# Patient Record
Sex: Male | Born: 1950 | Race: White | Marital: Married | Smoking: Never smoker
Health system: Southern US, Community
[De-identification: ages and names within clinical notes are randomized; demographics above are authoritative.]

## PROBLEM LIST (undated history)

## (undated) DIAGNOSIS — N189 Chronic kidney disease, unspecified: Secondary | ICD-10-CM

## (undated) DIAGNOSIS — I639 Cerebral infarction, unspecified: Secondary | ICD-10-CM

## (undated) DIAGNOSIS — I6529 Occlusion and stenosis of unspecified carotid artery: Secondary | ICD-10-CM

## (undated) DIAGNOSIS — I10 Essential (primary) hypertension: Secondary | ICD-10-CM

## (undated) DIAGNOSIS — N401 Enlarged prostate with lower urinary tract symptoms: Secondary | ICD-10-CM

## (undated) DIAGNOSIS — N138 Other obstructive and reflux uropathy: Secondary | ICD-10-CM

## (undated) DIAGNOSIS — T8859XA Other complications of anesthesia, initial encounter: Secondary | ICD-10-CM

## (undated) DIAGNOSIS — M109 Gout, unspecified: Secondary | ICD-10-CM

## (undated) DIAGNOSIS — K219 Gastro-esophageal reflux disease without esophagitis: Secondary | ICD-10-CM

## (undated) DIAGNOSIS — T4145XA Adverse effect of unspecified anesthetic, initial encounter: Secondary | ICD-10-CM

## (undated) DIAGNOSIS — Z87442 Personal history of urinary calculi: Secondary | ICD-10-CM

## (undated) HISTORY — PX: KIDNEY STONE SURGERY: SHX686

## (undated) HISTORY — DX: Gout, unspecified: M10.9

## (undated) HISTORY — DX: Essential (primary) hypertension: I10

## (undated) HISTORY — PX: PROSTATECTOMY: SHX69

## (undated) HISTORY — PX: HERNIA REPAIR: SHX51

## (undated) HISTORY — DX: Occlusion and stenosis of unspecified carotid artery: I65.29

## (undated) HISTORY — DX: Cerebral infarction, unspecified: I63.9

## (undated) HISTORY — PX: APPENDECTOMY: SHX54

## (undated) HISTORY — DX: Gastro-esophageal reflux disease without esophagitis: K21.9

---

## 2001-12-27 DIAGNOSIS — K219 Gastro-esophageal reflux disease without esophagitis: Secondary | ICD-10-CM | POA: Insufficient documentation

## 2001-12-27 DIAGNOSIS — I10 Essential (primary) hypertension: Secondary | ICD-10-CM | POA: Insufficient documentation

## 2010-05-25 DIAGNOSIS — M109 Gout, unspecified: Secondary | ICD-10-CM | POA: Insufficient documentation

## 2010-06-20 DIAGNOSIS — H9319 Tinnitus, unspecified ear: Secondary | ICD-10-CM | POA: Insufficient documentation

## 2010-06-29 DIAGNOSIS — H903 Sensorineural hearing loss, bilateral: Secondary | ICD-10-CM | POA: Insufficient documentation

## 2013-07-06 DIAGNOSIS — N400 Enlarged prostate without lower urinary tract symptoms: Secondary | ICD-10-CM | POA: Insufficient documentation

## 2013-07-06 DIAGNOSIS — D509 Iron deficiency anemia, unspecified: Secondary | ICD-10-CM | POA: Insufficient documentation

## 2014-12-30 DIAGNOSIS — D126 Benign neoplasm of colon, unspecified: Secondary | ICD-10-CM | POA: Insufficient documentation

## 2017-02-05 ENCOUNTER — Ambulatory Visit (INDEPENDENT_AMBULATORY_CARE_PROVIDER_SITE_OTHER): Payer: Medicare Other | Admitting: Urology

## 2017-02-05 ENCOUNTER — Encounter: Payer: Self-pay | Admitting: Urology

## 2017-02-05 VITALS — BP 109/67 | HR 91 | Ht 68.0 in | Wt 187.4 lb

## 2017-02-05 DIAGNOSIS — R3912 Poor urinary stream: Secondary | ICD-10-CM

## 2017-02-05 DIAGNOSIS — N400 Enlarged prostate without lower urinary tract symptoms: Secondary | ICD-10-CM

## 2017-02-05 DIAGNOSIS — R972 Elevated prostate specific antigen [PSA]: Secondary | ICD-10-CM

## 2017-02-05 NOTE — Progress Notes (Signed)
02/05/2017 1:55 PM   Scott Spence [birth year] [id number]  Referring provider: Maryland Pink, MD [address] [facility] [place] [facility] [place], [place] [address]  Chief Complaint  Patient presents with  . Elevated PSA    HPI: Consultation for elevated PSA.  1) PSA elevation-patient with a PSA of 6.25 September 2016. PSA was rechecked June 2018 and noted to be 6.16.  No known FH of PCa, but doesn't know his dad's side.   No blood thinners or CV disease.   2) BPH-patient with a history of BPH and takes tamsulosin. His AUA symptom score is 15, mostly satisfied. Predominant symptoms include weak stream, straining and nocturia. He does snore. If he misses a dose of tamsulosin he did Foley notices a weaker stream.  Today, pt is seen for the above. He has been well. He denies dysuria. UA clear.    PMH: Past Medical History:  Diagnosis Date  . GERD (gastroesophageal reflux disease)   . Gout   . Hypertension     Surgical History: No past surgical history on file.  Home Medications:  Allergies as of 02/05/2017      Reactions   Meperidine    PN: GI Upset   Sulfa Antibiotics Hives   Gabapentin    Other reaction(s): Other, see comments PN: Migraine HA   Lisinopril Cough      Medication List       Accurate as of 02/05/17  1:55 PM. Always use your most recent med list.          allopurinol 300 MG tablet Commonly known as:  ZYLOPRIM Take by mouth.   FISH OIL PO Take by mouth.   loratadine 10 MG tablet Commonly known as:  CLARITIN Take by mouth.   losartan 100 MG tablet Commonly known as:  COZAAR Take by mouth.   mometasone 50 MCG/ACT nasal spray Commonly known as:  NASONEX Place into the nose.   multivitamin capsule Take by mouth.   omeprazole 20 MG capsule Commonly known as:  PRILOSEC Take by mouth.   tamsulosin 0.4 MG Caps capsule Commonly known as:  FLOMAX Take by mouth.       Allergies:  Allergies  Allergen Reactions  . Meperidine     PN: GI  Upset  . Sulfa Antibiotics Hives  . Gabapentin     Other reaction(s): Other, see comments PN: Migraine HA  . Lisinopril Cough    Family History: Family History  Problem Relation Age of Onset  . Prostate cancer Neg Hx   . Bladder Cancer Neg Hx   . Kidney cancer Neg Hx     Social History:  reports that he has never smoked. He has never used smokeless tobacco. He reports that he drinks alcohol. He reports that he does not use drugs.  ROS: UROLOGY Frequent Urination?: No Hard to postpone urination?: Yes Burning/pain with urination?: No Get up at night to urinate?: Yes Leakage of urine?: Yes Urine stream starts and stops?: No Trouble starting stream?: No Do you have to strain to urinate?: No Blood in urine?: No Urinary tract infection?: No Sexually transmitted disease?: No Injury to kidneys or bladder?: No Painful intercourse?: No Weak stream?: Yes Erection problems?: No Penile pain?: No  Gastrointestinal Nausea?: No Vomiting?: No Indigestion/heartburn?: No Diarrhea?: No Constipation?: No  Constitutional Fever: No Night sweats?: No Weight loss?: No Fatigue?: No  Skin Skin rash/lesions?: No Itching?: No  Eyes Blurred vision?: No Double vision?: No  Ears/Nose/Throat Sore throat?: No Sinus problems?: No  Hematologic/Lymphatic Swollen glands?: No Easy bruising?: No  Cardiovascular Leg swelling?: No Chest pain?: No  Respiratory Cough?: No Shortness of breath?: No  Endocrine Excessive thirst?: No  Musculoskeletal Back pain?: Yes Joint pain?: No  Neurological Headaches?: No Dizziness?: No  Psychologic Depression?: No Anxiety?: No  Physical Exam: BP 109/67 (BP Location: Left Arm, Patient Position: Sitting, Cuff Size: Normal)   Pulse 91   Ht 5\' 8"  (1.727 m)   Wt 85 kg (187 lb 6.4 oz)   BMI 28.49 kg/m   Constitutional:  Alert and oriented, No acute distress. HEENT: [place] AT, moist mucus membranes.  Trachea midline, no  masses. Cardiovascular: No clubbing, cyanosis, or edema. Respiratory: Normal respiratory effort, no increased work of breathing. GI: Abdomen is soft, nontender, nondistended, no abdominal masses GU: No CVA tenderness.  DRE: prostate about 70 grams, smooth, no hard areas or nodules  Skin: No rashes, bruises or suspicious lesions. Lymph: No cervical or inguinal adenopathy. Neurologic: Grossly intact, no focal deficits, moving all 4 extremities. Psychiatric: Normal mood and affect.  Laboratory Data: No results found for: WBC, HGB, HCT, MCV, PLT  No results found for: CREATININE  No results found for: PSA  No results found for: TESTOSTERONE  No results found for: HGBA1C  Urinalysis No results found for: COLORURINE, APPEARANCEUR, LABSPEC, PHURINE, GLUCOSEU, HGBUR, BILIRUBINUR, KETONESUR, PROTEINUR, UROBILINOGEN, NITRITE, LEUKOCYTESUR   Assessment & Plan:   1) PSA elevation - I had a long discussion with the patient on the nature of elevated PSA - benign vs malignant causes. We discussed age specific levels and that PCa can be seen on a biopsy with very low PSA levels (<=2.5). We discussed the nature risks and benefits of continued surveillance, other lab tests, imaging as well as prostate biopsy. We discussed the management of prostate cancer might include active surveillance or treatment depending on biopsy findings. All questions answered. He has done some reading and ask about a false negative biopsy. We discussed indeed a biopsy could be falsely negative which is one of the risk but if the patient had a negative biopsy in the PSA continued to rise further DRE changed we would set them up for an MRI scan. We discussed an MRI scan up from hasn't currently replaced a biopsy because the negative MRI doesn't completely rule out prostate cancer either and if the MRI is positive he would need a biopsy anyway.  2) BPH-he'll continue tamsulosin. He might benefit from a 5 alpha reductase inhibitor  in the future.    There are no diagnoses linked to this encounter.  No Follow-up on file.  Festus Aloe, [facility] [address], [place], [place] [address] [phone]

## 2017-02-06 LAB — URINALYSIS, COMPLETE
Bilirubin, UA: NEGATIVE
GLUCOSE, UA: NEGATIVE
Leukocytes, UA: NEGATIVE
Nitrite, UA: NEGATIVE
PROTEIN UA: NEGATIVE
RBC, UA: NEGATIVE
Specific Gravity, UA: 1.025 (ref 1.005–1.030)
Urobilinogen, Ur: 1 mg/dL (ref 0.2–1.0)
pH, UA: 6 (ref 5.0–7.5)

## 2017-02-26 ENCOUNTER — Encounter: Payer: Self-pay | Admitting: Urology

## 2017-02-26 ENCOUNTER — Other Ambulatory Visit: Payer: Self-pay | Admitting: Urology

## 2017-02-26 ENCOUNTER — Ambulatory Visit (INDEPENDENT_AMBULATORY_CARE_PROVIDER_SITE_OTHER): Payer: Medicare Other | Admitting: Urology

## 2017-02-26 VITALS — BP 136/81 | HR 70 | Ht 68.0 in | Wt 182.0 lb

## 2017-02-26 DIAGNOSIS — R972 Elevated prostate specific antigen [PSA]: Secondary | ICD-10-CM | POA: Diagnosis not present

## 2017-02-26 MED ORDER — GENTAMICIN SULFATE 40 MG/ML IJ SOLN
80.0000 mg | Freq: Once | INTRAMUSCULAR | Status: AC
Start: 2017-02-26 — End: 2017-02-26
  Administered 2017-02-26: 80 mg via INTRAMUSCULAR

## 2017-02-26 MED ORDER — LEVOFLOXACIN 500 MG PO TABS
500.0000 mg | ORAL_TABLET | Freq: Once | ORAL | Status: AC
  Administered 2017-02-26: 500 mg via ORAL

## 2017-02-26 NOTE — Progress Notes (Signed)
Prostate Biopsy Procedure   Informed consent was obtained after discussing risks/benefits of the procedure.  A time out was performed to ensure correct patient identity.  Pre-Procedure: - Last PSA Level: 6.16 - Gentamicin given prophylactically - Levaquin 500 mg administered PO -Transrectal Ultrasound performed revealing a 50 gm prostate -No significant hypoechoic or median lobe noted  Procedure: - Prostate block performed using 10 cc 1% lidocaine and biopsies taken from sextant areas, a total of 12 under ultrasound guidance.  Post-Procedure: - Patient tolerated the procedure well - He was counseled to seek immediate medical attention if experiences any severe pain, significant bleeding, or fevers - Return in one week to discuss biopsy results

## 2017-03-01 LAB — PATHOLOGY REPORT

## 2017-03-04 ENCOUNTER — Other Ambulatory Visit: Payer: Self-pay | Admitting: Urology

## 2017-03-14 ENCOUNTER — Ambulatory Visit (INDEPENDENT_AMBULATORY_CARE_PROVIDER_SITE_OTHER): Payer: Medicare Other | Admitting: Urology

## 2017-03-14 ENCOUNTER — Encounter: Payer: Self-pay | Admitting: Urology

## 2017-03-14 VITALS — BP 124/68 | HR 76 | Ht 68.0 in | Wt 191.1 lb

## 2017-03-14 DIAGNOSIS — R972 Elevated prostate specific antigen [PSA]: Secondary | ICD-10-CM

## 2017-03-14 DIAGNOSIS — N400 Enlarged prostate without lower urinary tract symptoms: Secondary | ICD-10-CM

## 2017-03-14 NOTE — Progress Notes (Signed)
03/14/2017 3:12 PM   Scott Spence 1951-03-04 [id number]  Referring provider: Maryland Pink, MD [address] [facility] [place], [place] [address]  Chief Complaint  Patient presents with  . Follow-up    Biopsy results    HPI: The patient is a 66 year old gentleman presents to discuss his prostate biopsy results.  1) PSA elevation-patient with a PSA of 6.25 September 2016. PSA was rechecked June 2018 and noted to be 6.16.  No known FH of PCa, but doesn't know his dad's side.   Patient on a prostate biopsy which was negative for malignancy he did have some focal inflammation.  2) BPH-patient with a history of BPH and takes tamsulosin 0.8 mg. His AUA symptom score is 15, mostly satisfied. Predominant symptoms include weak stream, straining and nocturia. He does snore. If he misses a dose of tamsulosin he does notice a weaker stream.     PMH: Past Medical History:  Diagnosis Date  . GERD (gastroesophageal reflux disease)   . Gout   . Hypertension     Surgical History: History reviewed. No pertinent surgical history.  Home Medications:  Allergies as of 03/14/2017      Reactions   Meperidine    PN: GI Upset   Sulfa Antibiotics Hives   Gabapentin    Other reaction(s): Other, see comments PN: Migraine HA   Lisinopril Cough      Medication List       Accurate as of 03/14/17  3:12 PM. Always use your most recent med list.          allopurinol 300 MG tablet Commonly known as:  ZYLOPRIM Take by mouth.   FISH OIL PO Take by mouth.   loratadine 10 MG tablet Commonly known as:  CLARITIN Take by mouth.   losartan 100 MG tablet Commonly known as:  COZAAR Take by mouth.   mometasone 50 MCG/ACT nasal spray Commonly known as:  NASONEX Place into the nose.   multivitamin capsule Take by mouth.   omeprazole 20 MG capsule Commonly known as:  PRILOSEC Take by mouth.   tamsulosin 0.4 MG Caps capsule Commonly known as:  FLOMAX Take by mouth.            Discharge Care Instructions        Start     Ordered   03/14/17 0000  PSA     03/14/17 1512      Allergies:  Allergies  Allergen Reactions  . Meperidine     PN: GI Upset  . Sulfa Antibiotics Hives  . Gabapentin     Other reaction(s): Other, see comments PN: Migraine HA  . Lisinopril Cough    Family History: Family History  Problem Relation Age of Onset  . Prostate cancer Neg Hx   . Bladder Cancer Neg Hx   . Kidney cancer Neg Hx     Social History:  reports that he has never smoked. He has never used smokeless tobacco. He reports that he drinks alcohol. He reports that he does not use drugs.  ROS: UROLOGY Frequent Urination?: Yes Hard to postpone urination?: No Burning/pain with urination?: No Get up at night to urinate?: Yes Leakage of urine?: No Urine stream starts and stops?: No Trouble starting stream?: No Do you have to strain to urinate?: No Blood in urine?: No Urinary tract infection?: No Sexually transmitted disease?: No Injury to kidneys or bladder?: No Painful intercourse?: No Weak stream?: No Erection problems?: No Penile pain?: No  Gastrointestinal Nausea?:  No Vomiting?: No Indigestion/heartburn?: No Diarrhea?: No Constipation?: No  Constitutional Fever: No Night sweats?: No Weight loss?: No Fatigue?: No  Skin Skin rash/lesions?: No Itching?: No  Eyes Blurred vision?: No Double vision?: No  Ears/Nose/Throat Sore throat?: No Sinus problems?: No  Hematologic/Lymphatic Swollen glands?: No Easy bruising?: No  Cardiovascular Leg swelling?: No Chest pain?: No  Respiratory Cough?: No Shortness of breath?: No  Endocrine Excessive thirst?: No  Musculoskeletal Back pain?: Yes Joint pain?: Yes  Neurological Headaches?: No Dizziness?: No  Psychologic Depression?: No Anxiety?: No  Physical Exam: BP 124/68 (BP Location: Left Arm, Patient Position: Sitting, Cuff Size: Normal)   Pulse 76   Ht 5\' 8"  (1.727 m)    Wt 191 lb 1.6 oz (86.7 kg)   BMI 29.06 kg/m   Constitutional:  Alert and oriented, No acute distress. HEENT: [place] AT, moist mucus membranes.  Trachea midline, no masses. Cardiovascular: No clubbing, cyanosis, or edema. Respiratory: Normal respiratory effort, no increased work of breathing. GI: Abdomen is soft, nontender, nondistended, no abdominal masses GU: No CVA tenderness.  Skin: No rashes, bruises or suspicious lesions. Lymph: No cervical or inguinal adenopathy. Neurologic: Grossly intact, no focal deficits, moving all 4 extremities. Psychiatric: Normal mood and affect.  Laboratory Data: No results found for: WBC, HGB, HCT, MCV, PLT  No results found for: CREATININE  No results found for: PSA  No results found for: TESTOSTERONE  No results found for: HGBA1C  Urinalysis    Component Value Date/Time   APPEARANCEUR Clear 02/05/2017 1421   GLUCOSEU Negative 02/05/2017 1421   BILIRUBINUR Negative 02/05/2017 1421   PROTEINUR Negative 02/05/2017 1421   NITRITE Negative 02/05/2017 1421   LEUKOCYTESUR Negative 02/05/2017 1421     Assessment & Plan:    1. Elevated PSA Negative prostate biopsy. Follow up in 6 months with repeat PSA.  2. BPH Continue Flomax  Return in about 6 months (around 09/14/2017) for PSA prior.  Nickie Retort, MD  [facility] [address], [place], [place] [address] [phone]

## 2017-09-09 ENCOUNTER — Other Ambulatory Visit: Payer: Medicare Other

## 2017-09-09 DIAGNOSIS — R972 Elevated prostate specific antigen [PSA]: Secondary | ICD-10-CM

## 2017-09-10 LAB — PSA: PROSTATE SPECIFIC AG, SERUM: 6.8 ng/mL — AB (ref 0.0–4.0)

## 2017-09-12 ENCOUNTER — Ambulatory Visit (INDEPENDENT_AMBULATORY_CARE_PROVIDER_SITE_OTHER): Payer: Medicare Other | Admitting: Urology

## 2017-09-12 ENCOUNTER — Encounter: Payer: Self-pay | Admitting: Urology

## 2017-09-12 VITALS — BP 136/89 | HR 72 | Ht 68.0 in | Wt 195.3 lb

## 2017-09-12 DIAGNOSIS — N400 Enlarged prostate without lower urinary tract symptoms: Secondary | ICD-10-CM

## 2017-09-12 DIAGNOSIS — R972 Elevated prostate specific antigen [PSA]: Secondary | ICD-10-CM | POA: Diagnosis not present

## 2017-09-12 NOTE — Progress Notes (Signed)
09/12/2017 2:05 PM   Scott Spence 1951/04/16 [id number]  Referring provider: Maryland Pink, MD [address] [facility] [place], [place] [address]  Chief Complaint  Patient presents with  . Elevated PSA    HPI: The patient is a 67 year old gentleman presents today for follow-up of elevated PSA.  1) PSA elevation-patient with a PSA of 6.25 September 2016. PSA was rechecked June 2018 and noted to be 6.16. PSA in February 2019 was 6.8.   No known FH of PCa, but doesn't know his dad's side.   Patient ha a prostate biopsy which was negative for malignancy he did have some focal inflammation in August 2018.  DRE 70 gm benign in July 2018.   2) BPH-patient with a history of BPH and takes tamsulosin 0.8 mg. His AUA symptom score was 15, mostly satisfied. Predominant symptoms include weak stream, straining and nocturia. He does snore. If he misses a dose of tamsulosin he does notice a weaker stream.    Today, the patient returns to discuss his elevated PSA.  He has no new complaints at this time.  PMH: Past Medical History:  Diagnosis Date  . GERD (gastroesophageal reflux disease)   . Gout   . Hypertension     Surgical History: History reviewed. No pertinent surgical history.  Home Medications:  Allergies as of 09/12/2017      Reactions   Meperidine    PN: GI Upset   Sulfa Antibiotics Hives   Gabapentin    Other reaction(s): Other, see comments PN: Migraine HA   Lisinopril Cough      Medication List        Accurate as of 09/12/17  2:05 PM. Always use your most recent med list.          allopurinol 300 MG tablet Commonly known as:  ZYLOPRIM Take by mouth.   Coenzyme Q-10 200 MG Caps Take by mouth.   colchicine 0.6 MG tablet Take by mouth.   FISH OIL PO Take by mouth.   loratadine 10 MG tablet Commonly known as:  CLARITIN Take by mouth.   losartan 100 MG tablet Commonly known as:  COZAAR Take by mouth.   magnesium citrate Soln Take 1 Bottle  by mouth once.   mometasone 50 MCG/ACT nasal spray Commonly known as:  NASONEX Place into the nose.   multivitamin capsule Take by mouth.   omeprazole 20 MG capsule Commonly known as:  PRILOSEC Take by mouth.   RESTASIS 0.05 % ophthalmic emulsion Generic drug:  cycloSPORINE   tamsulosin 0.4 MG Caps capsule Commonly known as:  FLOMAX Take by mouth.   triamcinolone cream 0.1 % Commonly known as:  KENALOG   VITAMIN B 12 PO Take by mouth.       Allergies:  Allergies  Allergen Reactions  . Meperidine     PN: GI Upset  . Sulfa Antibiotics Hives  . Gabapentin     Other reaction(s): Other, see comments PN: Migraine HA  . Lisinopril Cough    Family History: Family History  Problem Relation Age of Onset  . Prostate cancer Neg Hx   . Bladder Cancer Neg Hx   . Kidney cancer Neg Hx     Social History:  reports that  has never smoked. he has never used smokeless tobacco. He reports that he drinks alcohol. He reports that he does not use drugs.  ROS: UROLOGY Frequent Urination?: No Hard to postpone urination?: No Burning/pain with urination?: No Get up at night  to urinate?: Yes Leakage of urine?: No Urine stream starts and stops?: No Trouble starting stream?: No Do you have to strain to urinate?: No Blood in urine?: No Urinary tract infection?: No Sexually transmitted disease?: No Injury to kidneys or bladder?: No Painful intercourse?: No Weak stream?: No Erection problems?: No Penile pain?: No  Gastrointestinal Nausea?: No Vomiting?: No Indigestion/heartburn?: No Diarrhea?: No Constipation?: No  Constitutional Fever: No Night sweats?: No Weight loss?: No Fatigue?: No  Skin Skin rash/lesions?: No Itching?: No  Eyes Blurred vision?: No Double vision?: No  Ears/Nose/Throat Sore throat?: No Sinus problems?: No  Hematologic/Lymphatic Swollen glands?: No Easy bruising?: No  Cardiovascular Leg swelling?: No Chest pain?:  No  Respiratory Cough?: No Shortness of breath?: No  Endocrine Excessive thirst?: No  Musculoskeletal Back pain?: No Joint pain?: No  Neurological Headaches?: No Dizziness?: No  Psychologic Depression?: No Anxiety?: No  Physical Exam: BP 136/89 (BP Location: Right Arm, Patient Position: Sitting, Cuff Size: Normal)   Pulse 72   Ht 5\' 8"  (1.727 m)   Wt 195 lb 4.8 oz (88.6 kg)   BMI 29.70 kg/m   Constitutional:  Alert and oriented, No acute distress. HEENT: [place] AT, moist mucus membranes.  Trachea midline, no masses. Cardiovascular: No clubbing, cyanosis, or edema. Respiratory: Normal respiratory effort, no increased work of breathing. GI: Abdomen is soft, nontender, nondistended, no abdominal masses GU: No CVA tenderness.  Skin: No rashes, bruises or suspicious lesions. Lymph: No cervical or inguinal adenopathy. Neurologic: Grossly intact, no focal deficits, moving all 4 extremities. Psychiatric: Normal mood and affect.  Laboratory Data: No results found for: WBC, HGB, HCT, MCV, PLT  No results found for: CREATININE  No results found for: PSA  No results found for: TESTOSTERONE  No results found for: HGBA1C  Urinalysis    Component Value Date/Time   APPEARANCEUR Clear 02/05/2017 1421   GLUCOSEU Negative 02/05/2017 1421   BILIRUBINUR Negative 02/05/2017 1421   PROTEINUR Negative 02/05/2017 1421   NITRITE Negative 02/05/2017 1421   LEUKOCYTESUR Negative 02/05/2017 1421    Assessment & Plan:    1. Elevated PSA PSA relatively stable. Repeat PSA/DRE in 6 months  2. BPH Continue flomax 0.8 mg  Return in about 6 months (around 03/12/2018) for PSA prior.  Nickie Retort, MD  [facility] [address], [place], [place] [address] [phone]

## 2018-01-15 DIAGNOSIS — M1611 Unilateral primary osteoarthritis, right hip: Secondary | ICD-10-CM | POA: Insufficient documentation

## 2018-03-10 ENCOUNTER — Other Ambulatory Visit: Payer: Medicare Other

## 2018-03-12 ENCOUNTER — Ambulatory Visit: Payer: Medicare Other | Admitting: Urology

## 2018-03-17 ENCOUNTER — Encounter
Admission: RE | Admit: 2018-03-17 | Discharge: 2018-03-17 | Disposition: A | Discharge status: Home / Self Care | Payer: Medicare Other | Source: Ambulatory Visit | Attending: Orthopedic Surgery | Admitting: Orthopedic Surgery

## 2018-03-17 ENCOUNTER — Other Ambulatory Visit: Payer: Self-pay

## 2018-03-17 ENCOUNTER — Other Ambulatory Visit: Payer: Medicare Other

## 2018-03-17 DIAGNOSIS — I10 Essential (primary) hypertension: Secondary | ICD-10-CM | POA: Diagnosis not present

## 2018-03-17 DIAGNOSIS — Z01812 Encounter for preprocedural laboratory examination: Secondary | ICD-10-CM | POA: Diagnosis not present

## 2018-03-17 DIAGNOSIS — Z0183 Encounter for blood typing: Secondary | ICD-10-CM | POA: Insufficient documentation

## 2018-03-17 DIAGNOSIS — Z01810 Encounter for preprocedural cardiovascular examination: Secondary | ICD-10-CM | POA: Insufficient documentation

## 2018-03-17 DIAGNOSIS — R972 Elevated prostate specific antigen [PSA]: Secondary | ICD-10-CM

## 2018-03-17 DIAGNOSIS — R9431 Abnormal electrocardiogram [ECG] [EKG]: Secondary | ICD-10-CM | POA: Diagnosis not present

## 2018-03-17 HISTORY — DX: Other complications of anesthesia, initial encounter: T88.59XA

## 2018-03-17 HISTORY — DX: Adverse effect of unspecified anesthetic, initial encounter: T41.45XA

## 2018-03-17 HISTORY — DX: Chronic kidney disease, unspecified: N18.9

## 2018-03-17 HISTORY — DX: Personal history of urinary calculi: Z87.442

## 2018-03-17 LAB — APTT: APTT: 27 s (ref 24–36)

## 2018-03-17 LAB — CBC
HEMATOCRIT: 43.2 % (ref 40.0–52.0)
Hemoglobin: 14.8 g/dL (ref 13.0–18.0)
MCH: 33.9 pg (ref 26.0–34.0)
MCHC: 34.3 g/dL (ref 32.0–36.0)
MCV: 98.8 fL (ref 80.0–100.0)
PLATELETS: 136 10*3/uL — AB (ref 150–440)
RBC: 4.38 MIL/uL — AB (ref 4.40–5.90)
RDW: 13.8 % (ref 11.5–14.5)
WBC: 6.9 10*3/uL (ref 3.8–10.6)

## 2018-03-17 LAB — URINALYSIS, ROUTINE W REFLEX MICROSCOPIC
BILIRUBIN URINE: NEGATIVE
Glucose, UA: NEGATIVE mg/dL
HGB URINE DIPSTICK: NEGATIVE
KETONES UR: NEGATIVE mg/dL
Leukocytes, UA: NEGATIVE
NITRITE: NEGATIVE
Protein, ur: NEGATIVE mg/dL
Specific Gravity, Urine: 1.018 (ref 1.005–1.030)
pH: 5 (ref 5.0–8.0)

## 2018-03-17 LAB — SURGICAL PCR SCREEN
MRSA, PCR: NEGATIVE
Staphylococcus aureus: NEGATIVE

## 2018-03-17 LAB — BASIC METABOLIC PANEL
ANION GAP: 9 (ref 5–15)
BUN: 22 mg/dL (ref 8–23)
CO2: 26 mmol/L (ref 22–32)
Calcium: 9.5 mg/dL (ref 8.9–10.3)
Chloride: 104 mmol/L (ref 98–111)
Creatinine, Ser: 1.08 mg/dL (ref 0.61–1.24)
GFR calc Af Amer: >60 mL/min (ref >60)
Glucose, Bld: 101 mg/dL — ABNORMAL HIGH (ref 70–99)
POTASSIUM: 4.3 mmol/L (ref 3.5–5.1)
Sodium: 139 mmol/L (ref 135–145)

## 2018-03-17 LAB — TYPE AND SCREEN
ABO/RH(D): A POS
ANTIBODY SCREEN: NEGATIVE

## 2018-03-17 LAB — SEDIMENTATION RATE: Sed Rate: 3 mm/hr (ref 0–20)

## 2018-03-17 LAB — PROTIME-INR
INR: 0.92
Prothrombin Time: 12.3 seconds (ref 11.4–15.2)

## 2018-03-17 LAB — PSA: PROSTATIC SPECIFIC ANTIGEN: 5.87 ng/mL — AB (ref 0.00–4.00)

## 2018-03-17 NOTE — Care Management (Signed)
EKG reviewed. No ST elevations. No T wave inversions, No LAD. No severe hypertension recorded. I don't feel we need further consultations.

## 2018-03-17 NOTE — Patient Instructions (Addendum)
  Your procedure is scheduled on: Tuesday April 01, 2018 Report to Same Day Surgery 2nd floor medical mall ([place] Entrance-take elevator on left to 2nd floor.  Check in with surgery information desk.) To find out your arrival time please call [phone] between 1PM - 3PM on Monday March 31, 2018  Remember: Instructions that are not followed completely may result in serious medical risk, up to and including death, or upon the discretion of your surgeon and anesthesiologist your surgery may need to be rescheduled.    _x___ 1. Do not eat food (including mints, candies, chewing gum) after midnight the night before your procedure. You may drink clear liquids up to 2 hours before you are scheduled to arrive at the hospital for your procedure.  Do not drink clear liquids within 2 hours of your scheduled arrival to the hospital.  Clear liquids include  --Water or Apple juice without pulp  --Clear carbohydrate beverage such as Gatorade  --Black Coffee or Clear Tea (No milk, no creamers, do not add anything to the coffee or tea)    __x__ 2. No Alcohol for 24 hours before or after surgery.   __x__ 3. No Smoking or e-cigarettes for 24 prior to surgery.  Do not use any chewable tobacco products for at least 6 hour prior to surgery   __x__ 4. Notify your doctor if there is any change in your medical condition (cold, fever, infections).   __x__ 5. On the morning of surgery brush your teeth with toothpaste and water.  You may rinse your mouth with mouth wash if you wish.  Do not swallow any toothpaste or mouthwash.  Please bring a copy of your advanced directive ([facility] [place])  Please read over the following fact sheets that you were given:   [facility] Preparing for Surgery and or MRSA Information    __x__ Use CHG Soap or sage wipes as directed on instruction sheet    Do not wear jewelry.  Do not wear lotions, powders, deodorant, or perfumes.   Do not  shave below the face/neck 48 hours prior to surgery.   Do not bring valuables to the hospital.    [facility] is not responsible for any belongings or valuables.               Contacts, dentures or bridgework may not be worn into surgery.  Leave your suitcase in the car. After surgery it may be brought to your room.  For patients admitted to the hospital, discharge time is determined by your treatment team.   _x___ Take anti-hypertensive listed below, cardiac, seizure, asthma, anti-reflux and psychiatric medicines. These include:  1. Allopurinol/Zyloprim  2. Tamsulosin/Flomax  3. Mometasone/Nasonex  4. Omeprazole/Prilosec  5. Restasis Eye Drops  Do not take your Losartan/Cozaar on the day of surgery.  _x___ Follow recommendations from Cardiologist, Pulmonologist or PCP regarding stopping Aspirin, Coumadin, Plavix ,Eliquis, Effient, or Pradaxa, and Pletal.  _x___ NOW: Stop Anti-inflammatories such as Meloxicam/Mobic, Advil, Aleve, Ibuprofen, Motrin, Naproxen, Naprosyn, Goodies powders or aspirin products. OK to take Tylenol and Celebrex.   _x___ NOW: Stop supplements (Calcium, Coenzyme Q, Glucosamine Complex, Omega-3/Fish Oil) until after surgery.  But may continue Vitamin D, Vitamin B, and multivitamin.

## 2018-03-19 ENCOUNTER — Inpatient Hospital Stay: Admission: RE | Admit: 2018-03-19 | Payer: Self-pay | Source: Ambulatory Visit

## 2018-03-19 LAB — URINE CULTURE: Culture: NO GROWTH

## 2018-03-20 ENCOUNTER — Ambulatory Visit (INDEPENDENT_AMBULATORY_CARE_PROVIDER_SITE_OTHER): Payer: Medicare Other | Admitting: Urology

## 2018-03-20 ENCOUNTER — Encounter: Payer: Self-pay | Admitting: Urology

## 2018-03-20 VITALS — BP 115/71 | HR 83 | Ht 67.0 in | Wt 189.9 lb

## 2018-03-20 DIAGNOSIS — R972 Elevated prostate specific antigen [PSA]: Secondary | ICD-10-CM

## 2018-03-20 NOTE — Patient Instructions (Signed)
Prostate-Specific Antigen Test Why am I having this test? The prostate-specific antigen (PSA) test is performed to determine how much PSA you have in your blood. PSA is a type of protein that is normally present in the prostate gland. Certain conditions can cause PSA blood levels to increase, such as:  Infection in the prostate (prostatitis).  Enlargement of the prostate (hypertrophy).  Prostate cancer.  Because PSA levels increase greatly from prostate cancer, this test can be used to confirm a diagnosis of prostate cancer. It may also be used to monitor treatment for prostate cancer and to watch for a return of prostate cancer after treatment has finished. This test has a very high false-positive rate. Therefore, routine PSA screening for all men is no longer recommended. A false-positive result is incorrect because it indicates a condition or finding is present when it is not. What kind of sample is taken? A blood sample is required for this test. It is usually collected by inserting a needle into a vein or by sticking a finger with a small needle. How do I prepare for this test? There is no preparation required for this test. However, there are factors that can affect the results of a PSA test. To get the most accurate results:  Avoid having a rectal exam within several hours before having your blood drawn for this test.  Avoid having any procedures performed on the prostate gland within 6 weeks of having this test.  Avoid ejaculating within 24 hours of having this test.  Tell your health care provider if you had a recent urinary tract infection (UTI).  Tell your health care provider if you are taking medicines to assist with hair growth, such as finasteride.  Tell your health care provider if you have been exposed to a medicine called diethylstilbestrol.  Let your health care provider know if any of these factors apply to you. You may be asked to reschedule the test. What are the  reference ranges? Reference ranges are established after testing a large group of people. Reference ranges may vary among different people, labs, and hospitals. It is your responsibility to obtain your test results. Ask the lab or department performing the test when and how you will get your results.  Low: 0-2.5 ng/mL.  Slightly to moderately elevated: 2.6-10.0 ng/mL.  Moderately elevated: 10.0-19.9 ng/mL.  Significantly elevated: 20 ng/mL or greater.  What do the results mean? PSA test results greater than 4 ng/mL are found in the majority of men with prostate cancer. If your test result is above this level, this can indicate an increased risk for prostate cancer. Increased PSA levels can also indicate other health conditions. Talk with your health care provider to discuss your results, treatment options, and if necessary, the need for more tests. Talk with your health care provider if you have any questions about your results. Talk with your health care provider to discuss your results, treatment options, and if necessary, the need for more tests. Talk with your health care provider if you have any questions about your results. This information is not intended to replace advice given to you by your health care provider. Make sure you discuss any questions you have with your health care provider. Document Released: 08/11/2004 Document Revised: 03/14/2016 Document Reviewed: 12/02/2013 Elsevier Interactive Patient Education  2018 Elsevier Inc.  

## 2018-03-20 NOTE — Progress Notes (Signed)
   03/20/2018 2:59 PM   Scott Spence 06-Jul-1951 [id number]  Reason for visit: Follow up elevated PSA  HPI: I had the pleasure of seeing Scott Spence in urology clinic today for follow-up of elevated PSA.  Briefly, he is a 67 year old male whose PSA has ranged between 6-7 over the last 18 months.  He underwent prostate biopsy 02/2017 that showed a 50g gland with focal inflammation but otherwise benign.  History of prostatitis in his 17s, though no recent infections.  He is happy with his voiding on Flomax and overall has no complaints.  PSA today is down to 5.87 from 6.8.   ROS: Please see flowsheet from today's date for complete review of systems.  Physical Exam: There were no vitals taken for this visit.   Constitutional:  Alert and oriented, No acute distress. Respiratory: Normal respiratory effort, no increased work of breathing. GI: Abdomen is soft, nontender, nondistended, no abdominal masses Skin: No rashes, bruises or suspicious lesions. Neurologic: Grossly intact, no focal deficits, moving all 4 extremities. Psychiatric: Normal mood and affect  Laboratory Data: PSA: 02/2018: 5.87 08/2017: 6.8 12/2016: 6.16 09/2016: 6.67  Assessment & Plan:   In summary, Scott Spence is a healthy 67 year old male with a 50 g prostate and history of negative prostate biopsy in August 2018 we are following for elevated PSA.  His PSA today is down to 5.87 from 6.8.  Since his PSA is downtrending, we will see him back in 1 year with a PSA with free to total ratio, and DRE.   B[place]ley Co, [facility] [address] [place], [place] [address] [phone]

## 2018-03-31 MED ORDER — TRANEXAMIC ACID 1000 MG/10ML IV SOLN
1000.0000 mg | INTRAVENOUS | Status: AC
  Administered 2018-04-01: 1000 mg via INTRAVENOUS
  Filled 2018-03-31: qty 1000

## 2018-03-31 MED ORDER — CEFAZOLIN SODIUM-DEXTROSE 2-4 GM/100ML-% IV SOLN
2.0000 g | Freq: Once | INTRAVENOUS | Status: AC
  Administered 2018-04-01: 2 g via INTRAVENOUS

## 2018-04-01 ENCOUNTER — Inpatient Hospital Stay: Payer: Medicare Other

## 2018-04-01 ENCOUNTER — Encounter
Admission: RE | Disposition: A | Discharge status: Home Health | Payer: Self-pay | Source: Ambulatory Visit | Attending: Orthopedic Surgery

## 2018-04-01 ENCOUNTER — Encounter: Payer: Self-pay | Admitting: Anesthesiology

## 2018-04-01 ENCOUNTER — Other Ambulatory Visit: Payer: Self-pay

## 2018-04-01 ENCOUNTER — Inpatient Hospital Stay: Payer: Medicare Other | Admitting: Certified Registered"

## 2018-04-01 ENCOUNTER — Inpatient Hospital Stay: Payer: Medicare Other | Admitting: Anesthesiology

## 2018-04-01 ENCOUNTER — Inpatient Hospital Stay
Admission: RE | Admit: 2018-04-01 | Discharge: 2018-04-03 | DRG: 470 | Disposition: A | Discharge status: Home Health | Payer: Medicare Other | Source: Ambulatory Visit | Attending: Orthopedic Surgery | Admitting: Orthopedic Surgery

## 2018-04-01 DIAGNOSIS — Z882 Allergy status to sulfonamides status: Secondary | ICD-10-CM | POA: Diagnosis not present

## 2018-04-01 DIAGNOSIS — Z8249 Family history of ischemic heart disease and other diseases of the circulatory system: Secondary | ICD-10-CM | POA: Diagnosis not present

## 2018-04-01 DIAGNOSIS — I129 Hypertensive chronic kidney disease with stage 1 through stage 4 chronic kidney disease, or unspecified chronic kidney disease: Secondary | ICD-10-CM | POA: Diagnosis present

## 2018-04-01 DIAGNOSIS — Z96641 Presence of right artificial hip joint: Secondary | ICD-10-CM

## 2018-04-01 DIAGNOSIS — Z79899 Other long term (current) drug therapy: Secondary | ICD-10-CM | POA: Diagnosis not present

## 2018-04-01 DIAGNOSIS — Z419 Encounter for procedure for purposes other than remedying health state, unspecified: Secondary | ICD-10-CM

## 2018-04-01 DIAGNOSIS — Z87442 Personal history of urinary calculi: Secondary | ICD-10-CM

## 2018-04-01 DIAGNOSIS — G8918 Other acute postprocedural pain: Secondary | ICD-10-CM

## 2018-04-01 DIAGNOSIS — N181 Chronic kidney disease, stage 1: Secondary | ICD-10-CM | POA: Diagnosis present

## 2018-04-01 DIAGNOSIS — M109 Gout, unspecified: Secondary | ICD-10-CM | POA: Diagnosis present

## 2018-04-01 DIAGNOSIS — Z888 Allergy status to other drugs, medicaments and biological substances status: Secondary | ICD-10-CM

## 2018-04-01 DIAGNOSIS — Z791 Long term (current) use of non-steroidal anti-inflammatories (NSAID): Secondary | ICD-10-CM | POA: Diagnosis not present

## 2018-04-01 DIAGNOSIS — R509 Fever, unspecified: Secondary | ICD-10-CM | POA: Diagnosis not present

## 2018-04-01 DIAGNOSIS — K219 Gastro-esophageal reflux disease without esophagitis: Secondary | ICD-10-CM | POA: Diagnosis present

## 2018-04-01 DIAGNOSIS — Z23 Encounter for immunization: Secondary | ICD-10-CM

## 2018-04-01 DIAGNOSIS — M1611 Unilateral primary osteoarthritis, right hip: Principal | ICD-10-CM | POA: Diagnosis present

## 2018-04-01 HISTORY — PX: TOTAL HIP ARTHROPLASTY: SHX124

## 2018-04-01 LAB — TYPE AND SCREEN
ABO/RH(D): A POS
Antibody Screen: NEGATIVE

## 2018-04-01 SURGERY — ARTHROPLASTY, HIP, TOTAL, ANTERIOR APPROACH
Anesthesia: Spinal | Site: Hip | Laterality: Right | Wound class: Clean

## 2018-04-01 MED ORDER — METHOCARBAMOL 1000 MG/10ML IJ SOLN
500.0000 mg | Freq: Four times a day (QID) | INTRAVENOUS | Status: DC | PRN
  Filled 2018-04-01: qty 5

## 2018-04-01 MED ORDER — FENTANYL CITRATE (PF) 100 MCG/2ML IJ SOLN
INTRAMUSCULAR | Status: DC | PRN
  Administered 2018-04-01 (×2): 25 ug via INTRAVENOUS
  Administered 2018-04-01: 50 ug via INTRAVENOUS

## 2018-04-01 MED ORDER — BISACODYL 5 MG PO TBEC
5.0000 mg | DELAYED_RELEASE_TABLET | Freq: Every day | ORAL | Status: DC | PRN
  Administered 2018-04-02: 5 mg via ORAL
  Filled 2018-04-01: qty 1

## 2018-04-01 MED ORDER — ADULT MULTIVITAMIN W/MINERALS CH
1.0000 | ORAL_TABLET | Freq: Every day | ORAL | Status: DC
  Administered 2018-04-02 – 2018-04-03 (×2): 1 via ORAL
  Filled 2018-04-01 (×2): qty 1

## 2018-04-01 MED ORDER — PANTOPRAZOLE SODIUM 40 MG PO TBEC
80.0000 mg | DELAYED_RELEASE_TABLET | Freq: Every day | ORAL | Status: DC
  Administered 2018-04-02 – 2018-04-03 (×3): 80 mg via ORAL
  Filled 2018-04-01 (×3): qty 2

## 2018-04-01 MED ORDER — MIDAZOLAM HCL 2 MG/2ML IJ SOLN
INTRAMUSCULAR | Status: AC
  Filled 2018-04-01: qty 2

## 2018-04-01 MED ORDER — PHENOL 1.4 % MT LIQD
1.0000 | OROMUCOSAL | Status: DC | PRN
  Filled 2018-04-01: qty 177

## 2018-04-01 MED ORDER — NEOMYCIN-POLYMYXIN B GU 40-200000 IR SOLN
Status: DC | PRN
  Administered 2018-04-01: 4 mL

## 2018-04-01 MED ORDER — PANTOPRAZOLE SODIUM 40 MG PO TBEC
80.0000 mg | DELAYED_RELEASE_TABLET | Freq: Once | ORAL | Status: AC
  Administered 2018-04-01: 80 mg via ORAL
  Filled 2018-04-01: qty 2

## 2018-04-01 MED ORDER — METHOCARBAMOL 500 MG PO TABS
500.0000 mg | ORAL_TABLET | Freq: Four times a day (QID) | ORAL | Status: DC | PRN
  Administered 2018-04-01: 500 mg via ORAL
  Filled 2018-04-01 (×2): qty 1

## 2018-04-01 MED ORDER — OMEGA-3-ACID ETHYL ESTERS 1 G PO CAPS
1000.0000 mg | ORAL_CAPSULE | Freq: Every day | ORAL | Status: DC
  Administered 2018-04-02 – 2018-04-03 (×2): 1000 mg via ORAL
  Filled 2018-04-01 (×2): qty 1

## 2018-04-01 MED ORDER — ALLOPURINOL 300 MG PO TABS
300.0000 mg | ORAL_TABLET | Freq: Every day | ORAL | Status: DC
  Administered 2018-04-02: 300 mg via ORAL
  Filled 2018-04-01 (×2): qty 1

## 2018-04-01 MED ORDER — MAGNESIUM HYDROXIDE 400 MG/5ML PO SUSP
30.0000 mL | Freq: Every day | ORAL | Status: DC | PRN
  Administered 2018-04-02 (×2): 30 mL via ORAL
  Filled 2018-04-01: qty 30

## 2018-04-01 MED ORDER — VITAMIN B-12 1000 MCG PO TABS
3000.0000 ug | ORAL_TABLET | Freq: Every day | ORAL | Status: DC
  Administered 2018-04-03: 3000 ug via ORAL
  Filled 2018-04-01: qty 30
  Filled 2018-04-01: qty 3

## 2018-04-01 MED ORDER — PANTOPRAZOLE SODIUM 40 MG PO TBEC: 80.0000 mg | DELAYED_RELEASE_TABLET | Freq: Every day | ORAL | Status: DC

## 2018-04-01 MED ORDER — GLYCOPYRROLATE 0.2 MG/ML IJ SOLN
INTRAMUSCULAR | Status: AC
  Filled 2018-04-01: qty 1

## 2018-04-01 MED ORDER — LOSARTAN POTASSIUM 50 MG PO TABS
100.0000 mg | ORAL_TABLET | Freq: Every day | ORAL | Status: DC
  Administered 2018-04-01 – 2018-04-02 (×2): 100 mg via ORAL
  Filled 2018-04-01 (×2): qty 2

## 2018-04-01 MED ORDER — PHENYLEPHRINE HCL 10 MG/ML IJ SOLN
INTRAMUSCULAR | Status: DC | PRN
  Administered 2018-04-01 (×4): 100 ug via INTRAVENOUS

## 2018-04-01 MED ORDER — LIDOCAINE HCL (PF) 2 % IJ SOLN
INTRAMUSCULAR | Status: DC | PRN
  Administered 2018-04-01: 50 mg

## 2018-04-01 MED ORDER — HYDROCODONE-ACETAMINOPHEN 5-325 MG PO TABS
ORAL_TABLET | ORAL | Status: AC
  Administered 2018-04-01: 1 via ORAL
  Filled 2018-04-01: qty 1

## 2018-04-01 MED ORDER — ONDANSETRON HCL 4 MG/2ML IJ SOLN: 4.0000 mg | Freq: Four times a day (QID) | INTRAMUSCULAR | Status: DC | PRN

## 2018-04-01 MED ORDER — TAMSULOSIN HCL 0.4 MG PO CAPS
0.8000 mg | ORAL_CAPSULE | Freq: Every day | ORAL | Status: DC
  Administered 2018-04-02 – 2018-04-03 (×2): 0.8 mg via ORAL
  Filled 2018-04-01 (×2): qty 2

## 2018-04-01 MED ORDER — DIPHENHYDRAMINE HCL 12.5 MG/5ML PO ELIX
12.5000 mg | ORAL_SOLUTION | ORAL | Status: DC | PRN
  Administered 2018-04-01 – 2018-04-02 (×2): 12.5 mg via ORAL
  Filled 2018-04-01 (×2): qty 5
  Filled 2018-04-01: qty 10

## 2018-04-01 MED ORDER — MAGNESIUM CITRATE PO SOLN
1.0000 | Freq: Once | ORAL | Status: AC | PRN
  Administered 2018-04-03: 1 via ORAL
  Filled 2018-04-01 (×2): qty 296

## 2018-04-01 MED ORDER — PROPOFOL 500 MG/50ML IV EMUL
INTRAVENOUS | Status: DC | PRN
  Administered 2018-04-01: 50 ug/kg/min via INTRAVENOUS

## 2018-04-01 MED ORDER — VITAMIN D 1000 UNITS PO TABS
2000.0000 [IU] | ORAL_TABLET | Freq: Every day | ORAL | Status: DC
  Administered 2018-04-02: 2000 [IU] via ORAL
  Filled 2018-04-01: qty 2

## 2018-04-01 MED ORDER — BUPIVACAINE HCL (PF) 0.5 % IJ SOLN
INTRAMUSCULAR | Status: AC
  Filled 2018-04-01: qty 10

## 2018-04-01 MED ORDER — ACETAMINOPHEN 325 MG PO TABS: 325.0000 mg | ORAL_TABLET | Freq: Four times a day (QID) | ORAL | Status: DC | PRN

## 2018-04-01 MED ORDER — FLUTICASONE PROPIONATE 50 MCG/ACT NA SUSP
1.0000 | Freq: Every day | NASAL | Status: DC
  Administered 2018-04-02: 1 via NASAL
  Filled 2018-04-01: qty 16

## 2018-04-01 MED ORDER — NEOMYCIN-POLYMYXIN B GU 40-200000 IR SOLN
Status: AC
  Filled 2018-04-01: qty 20

## 2018-04-01 MED ORDER — ONDANSETRON HCL 4 MG PO TABS: 4.0000 mg | ORAL_TABLET | Freq: Four times a day (QID) | ORAL | Status: DC | PRN

## 2018-04-01 MED ORDER — FENTANYL CITRATE (PF) 100 MCG/2ML IJ SOLN: 25.0000 ug | INTRAMUSCULAR | Status: DC | PRN

## 2018-04-01 MED ORDER — ZOLPIDEM TARTRATE 5 MG PO TABS
5.0000 mg | ORAL_TABLET | Freq: Every evening | ORAL | Status: DC | PRN
  Administered 2018-04-01: 5 mg via ORAL
  Filled 2018-04-01: qty 1

## 2018-04-01 MED ORDER — ONDANSETRON HCL 4 MG/2ML IJ SOLN: 4.0000 mg | Freq: Once | INTRAMUSCULAR | Status: DC | PRN

## 2018-04-01 MED ORDER — BUPIVACAINE HCL (PF) 0.5 % IJ SOLN
INTRAMUSCULAR | Status: DC | PRN
  Administered 2018-04-01: 3 mL via INTRATHECAL

## 2018-04-01 MED ORDER — DOCUSATE SODIUM 100 MG PO CAPS
100.0000 mg | ORAL_CAPSULE | Freq: Two times a day (BID) | ORAL | Status: DC
  Administered 2018-04-01 – 2018-04-03 (×4): 100 mg via ORAL
  Filled 2018-04-01 (×4): qty 1

## 2018-04-01 MED ORDER — TRAMADOL HCL 50 MG PO TABS
50.0000 mg | ORAL_TABLET | Freq: Four times a day (QID) | ORAL | Status: DC
  Administered 2018-04-01 – 2018-04-02 (×6): 50 mg via ORAL
  Filled 2018-04-01 (×8): qty 1

## 2018-04-01 MED ORDER — MAGNESIUM OXIDE 400 (241.3 MG) MG PO TABS
250.0000 mg | ORAL_TABLET | Freq: Every day | ORAL | Status: DC
  Administered 2018-04-02 – 2018-04-03 (×2): 200 mg via ORAL
  Filled 2018-04-01 (×2): qty 1

## 2018-04-01 MED ORDER — FENTANYL CITRATE (PF) 100 MCG/2ML IJ SOLN
INTRAMUSCULAR | Status: AC
  Filled 2018-04-01: qty 2

## 2018-04-01 MED ORDER — METOCLOPRAMIDE HCL 5 MG/ML IJ SOLN: 5.0000 mg | Freq: Three times a day (TID) | INTRAMUSCULAR | Status: DC | PRN

## 2018-04-01 MED ORDER — SODIUM CHLORIDE 0.9 % IV SOLN
INTRAVENOUS | Status: DC
  Administered 2018-04-01 – 2018-04-02 (×2): via INTRAVENOUS

## 2018-04-01 MED ORDER — MORPHINE SULFATE (PF) 2 MG/ML IV SOLN
0.5000 mg | INTRAVENOUS | Status: DC | PRN
  Administered 2018-04-02: 1 mg via INTRAVENOUS
  Filled 2018-04-01: qty 1

## 2018-04-01 MED ORDER — METOCLOPRAMIDE HCL 10 MG PO TABS: 5.0000 mg | ORAL_TABLET | Freq: Three times a day (TID) | ORAL | Status: DC | PRN

## 2018-04-01 MED ORDER — ALUM & MAG HYDROXIDE-SIMETH 200-200-20 MG/5ML PO SUSP: 30.0000 mL | ORAL | Status: DC | PRN

## 2018-04-01 MED ORDER — CEFAZOLIN SODIUM-DEXTROSE 2-4 GM/100ML-% IV SOLN
2.0000 g | Freq: Four times a day (QID) | INTRAVENOUS | Status: AC
  Administered 2018-04-01 – 2018-04-02 (×3): 2 g via INTRAVENOUS
  Filled 2018-04-01 (×3): qty 100

## 2018-04-01 MED ORDER — MIDAZOLAM HCL 5 MG/5ML IJ SOLN
INTRAMUSCULAR | Status: DC | PRN
  Administered 2018-04-01 (×2): 2 mg via INTRAVENOUS

## 2018-04-01 MED ORDER — GLYCOPYRROLATE 0.2 MG/ML IJ SOLN
INTRAMUSCULAR | Status: DC | PRN
  Administered 2018-04-01: 0.2 mg via INTRAVENOUS

## 2018-04-01 MED ORDER — CEFAZOLIN SODIUM-DEXTROSE 2-4 GM/100ML-% IV SOLN
INTRAVENOUS | Status: AC
  Filled 2018-04-01: qty 100

## 2018-04-01 MED ORDER — BUPIVACAINE-EPINEPHRINE 0.25% -1:200000 IJ SOLN
INTRAMUSCULAR | Status: DC | PRN
  Administered 2018-04-01: 30 mL

## 2018-04-01 MED ORDER — LACTATED RINGERS IV SOLN
INTRAVENOUS | Status: DC
  Administered 2018-04-01: 13:00:00 via INTRAVENOUS

## 2018-04-01 MED ORDER — CYCLOSPORINE 0.05 % OP EMUL
2.0000 [drp] | Freq: Two times a day (BID) | OPHTHALMIC | Status: DC
  Administered 2018-04-01 – 2018-04-02 (×3): 2 [drp] via OPHTHALMIC
  Filled 2018-04-01 (×5): qty 1

## 2018-04-01 MED ORDER — HYDROCODONE-ACETAMINOPHEN 7.5-325 MG PO TABS
1.0000 | ORAL_TABLET | ORAL | Status: DC | PRN
  Administered 2018-04-01 (×2): 1 via ORAL
  Filled 2018-04-01 (×2): qty 1

## 2018-04-01 MED ORDER — INFLUENZA VAC SPLIT HIGH-DOSE 0.5 ML IM SUSY
0.5000 mL | PREFILLED_SYRINGE | INTRAMUSCULAR | Status: AC
  Administered 2018-04-02: 0.5 mL via INTRAMUSCULAR
  Filled 2018-04-01 (×2): qty 0.5

## 2018-04-01 MED ORDER — LIDOCAINE HCL (PF) 2 % IJ SOLN
INTRAMUSCULAR | Status: AC
  Filled 2018-04-01: qty 10

## 2018-04-01 MED ORDER — ASPIRIN EC 325 MG PO TBEC
325.0000 mg | DELAYED_RELEASE_TABLET | Freq: Every day | ORAL | Status: DC
  Administered 2018-04-02 – 2018-04-03 (×2): 325 mg via ORAL
  Filled 2018-04-01 (×2): qty 1

## 2018-04-01 MED ORDER — MENTHOL 3 MG MT LOZG
1.0000 | LOZENGE | OROMUCOSAL | Status: DC | PRN
  Filled 2018-04-01: qty 9

## 2018-04-01 MED ORDER — PROPOFOL 10 MG/ML IV BOLUS
INTRAVENOUS | Status: AC
  Filled 2018-04-01: qty 20

## 2018-04-01 MED ORDER — ACETAMINOPHEN 500 MG PO TABS
500.0000 mg | ORAL_TABLET | Freq: Four times a day (QID) | ORAL | Status: AC
  Administered 2018-04-01 – 2018-04-02 (×4): 500 mg via ORAL
  Filled 2018-04-01 (×4): qty 1

## 2018-04-01 MED ORDER — HYDROCODONE-ACETAMINOPHEN 5-325 MG PO TABS
1.0000 | ORAL_TABLET | ORAL | Status: DC | PRN
  Administered 2018-04-01 – 2018-04-02 (×2): 1 via ORAL
  Administered 2018-04-02: 2 via ORAL
  Administered 2018-04-02 (×2): 1 via ORAL
  Filled 2018-04-01: qty 2
  Filled 2018-04-01 (×5): qty 1

## 2018-04-01 SURGICAL SUPPLY — 57 items
BLADE SAGITTAL AGGR TOOTH XLG (BLADE) ×2 IMPLANT
BNDG COHESIVE 6X5 TAN STRL LF (GAUZE/BANDAGES/DRESSINGS) ×6 IMPLANT
CANISTER SUCT 1200ML W/VALVE (MISCELLANEOUS) ×2 IMPLANT
CHLORAPREP W/TINT 26ML (MISCELLANEOUS) ×2 IMPLANT
DRAPE C-ARM XRAY 36X54 (DRAPES) ×2 IMPLANT
DRAPE INCISE IOBAN 66X60 STRL (DRAPES) IMPLANT
DRAPE POUCH INSTRU U-SHP 10X18 (DRAPES) ×2 IMPLANT
DRAPE SHEET LG 3/4 BI-LAMINATE (DRAPES) ×6 IMPLANT
DRAPE TABLE BACK 80X90 (DRAPES) ×2 IMPLANT
DRESSING SURGICEL FIBRLLR 1X2 (HEMOSTASIS) ×2 IMPLANT
DRSG OPSITE POSTOP 4X8 (GAUZE/BANDAGES/DRESSINGS) ×4 IMPLANT
DRSG SURGICEL FIBRILLAR 1X2 (HEMOSTASIS) ×4
ELECT BLADE 6.5 EXT (BLADE) ×2 IMPLANT
ELECT REM PT RETURN 9FT ADLT (ELECTROSURGICAL) ×2
ELECTRODE REM PT RTRN 9FT ADLT (ELECTROSURGICAL) ×1 IMPLANT
GLOVE BIOGEL PI IND STRL 9 (GLOVE) ×1 IMPLANT
GLOVE BIOGEL PI INDICATOR 9 (GLOVE) ×1
GLOVE SURG SYN 9.0  PF PI (GLOVE) ×2
GLOVE SURG SYN 9.0 PF PI (GLOVE) ×2 IMPLANT
GOWN SRG 2XL LVL 4 RGLN SLV (GOWNS) ×1 IMPLANT
GOWN STRL NON-REIN 2XL LVL4 (GOWNS) ×1
GOWN STRL REUS W/ TWL LRG LVL3 (GOWN DISPOSABLE) ×1 IMPLANT
GOWN STRL REUS W/TWL LRG LVL3 (GOWN DISPOSABLE) ×1
HEMOVAC 400CC 10FR (MISCELLANEOUS) IMPLANT
HIP DBL LINER 54X28 (Liner) ×2 IMPLANT
HIP FEM HD S 28 (Head) ×2 IMPLANT
HOLDER FOLEY CATH W/STRAP (MISCELLANEOUS) ×2 IMPLANT
HOOD PEEL AWAY FLYTE STAYCOOL (MISCELLANEOUS) ×2 IMPLANT
KIT PREVENA INCISION MGT 13 (CANNISTER) ×2 IMPLANT
MAT ABSORB  FLUID 56X50 GRAY (MISCELLANEOUS) ×1
MAT ABSORB FLUID 56X50 GRAY (MISCELLANEOUS) ×1 IMPLANT
NDL SAFETY ECLIPSE 18X1.5 (NEEDLE) ×1 IMPLANT
NEEDLE HYPO 18GX1.5 SHARP (NEEDLE) ×1
NEEDLE SPNL 18GX3.5 QUINCKE PK (NEEDLE) ×2 IMPLANT
NS IRRIG 1000ML POUR BTL (IV SOLUTION) ×2 IMPLANT
PACK HIP COMPR (MISCELLANEOUS) ×2 IMPLANT
SCALPEL PROTECTED #10 DISP (BLADE) ×4 IMPLANT
SHELL ACETABULAR SZ 54 DM (Shell) ×2 IMPLANT
SOL PREP PVP 2OZ (MISCELLANEOUS) ×2
SOLUTION PREP PVP 2OZ (MISCELLANEOUS) ×1 IMPLANT
SPONGE DRAIN TRACH 4X4 STRL 2S (GAUZE/BANDAGES/DRESSINGS) ×2 IMPLANT
STAPLER SKIN PROX 35W (STAPLE) ×2 IMPLANT
STEM FEMORAL SZ3  STD COLLARED (Stem) ×2 IMPLANT
STRAP SAFETY 5IN WIDE (MISCELLANEOUS) ×2 IMPLANT
SUT DVC 2 QUILL PDO  T11 36X36 (SUTURE) ×1
SUT DVC 2 QUILL PDO T11 36X36 (SUTURE) ×1 IMPLANT
SUT SILK 0 (SUTURE) ×1
SUT SILK 0 30XBRD TIE 6 (SUTURE) ×1 IMPLANT
SUT V-LOC 90 ABS DVC 3-0 CL (SUTURE) ×2 IMPLANT
SUT VIC AB 1 CT1 36 (SUTURE) ×2 IMPLANT
SYR 20CC LL (SYRINGE) ×2 IMPLANT
SYR 30ML LL (SYRINGE) ×2 IMPLANT
SYR BULB IRRIG 60ML STRL (SYRINGE) ×2 IMPLANT
TAPE MICROFOAM 4IN (TAPE) ×2 IMPLANT
TOWEL OR 17X26 4PK STRL BLUE (TOWEL DISPOSABLE) ×2 IMPLANT
TRAY FOLEY MTR SLVR 16FR STAT (SET/KITS/TRAYS/PACK) ×2 IMPLANT
WND VAC CANISTER 500ML (MISCELLANEOUS) ×2 IMPLANT

## 2018-04-01 NOTE — Progress Notes (Signed)
Pt admitted to room 137 from PACU via hospital bed without incident per MD order. Pt oriented to room, call bell and unit routines. Bed alarm applied. Pt alert and oriented on admission. Pt vital signs stable. Wife, Adela Lank, at bedside and supportive. Pt had a spinal in PACU. Pt has no sensation bil hips to toes. Pt is able to twitch upper inner thigh muscles on command. + capillary refill of BLE and bounding pedal pulses. Rept received from [facility]. Will continue to monitor.

## 2018-04-01 NOTE — Anesthesia Procedure Notes (Signed)
Spinal  Patient location during procedure: OR Staffing Anesthesiologist: Thomas, Mathai, MD Resident/CRNA: Sicilia Killough, CRNA Performed: resident/CRNA  Preanesthetic Checklist Completed: patient identified, site marked, surgical consent, pre-op evaluation, timeout performed, IV checked, risks and benefits discussed and monitors and equipment checked Spinal Block Patient position: sitting Prep: ChloraPrep and site prepped and draped Patient monitoring: heart rate, continuous pulse ox, blood pressure and cardiac monitor Approach: midline Location: L4-5 Injection technique: single-shot Needle Needle type: Introducer and Pencan  Needle gauge: 24 G Needle length: 9 cm Additional Notes Negative paresthesia. Negative blood return. Positive free-flowing CSF. Expiration date of kit checked and confirmed. Patient tolerated procedure well, without complications.       

## 2018-04-01 NOTE — Progress Notes (Signed)
Pt sitting semi-fowlers in bed. Pt resting comfortably with wife at bedside. Pt now has sensation to bil knees. Pt is now able to twitch upper inner thigh muscles and knees on command. Pt still denies pain. Vital signs remain stable. Will continue to monitor.

## 2018-04-01 NOTE — Progress Notes (Signed)
Pt spinal slowly receeding down pt legs. Pt now able to clench thighs. Dr. Andree Elk notified. Ok for pt to go to room. Jamelle Noy E 5:36 PM 04/01/2018

## 2018-04-01 NOTE — H&P (Signed)
Reviewed paper H+P, will be scanned into chart. No changes noted.  

## 2018-04-01 NOTE — Transfer of Care (Signed)
Immediate Anesthesia Transfer of Care Note  Patient: Scott Spence  Procedure(s) Performed: TOTAL HIP ARTHROPLASTY ANTERIOR APPROACH (Right Hip)  Patient Location: PACU  Anesthesia Type:Spinal  Level of Consciousness: awake, alert  and oriented  Airway & Oxygen Therapy: Patient Spontanous Breathing  Post-op Assessment: Report given to RN and Post -op Vital signs reviewed and stable  Post vital signs: Reviewed  Last Vitals:  Vitals Value Taken Time  BP 99/66 04/01/2018  3:22 PM  Temp    Pulse 81 04/01/2018  3:23 PM  Resp 12 04/01/2018  3:23 PM  SpO2 99 % 04/01/2018  3:23 PM  Vitals shown include unvalidated device data.  Last Pain:  Vitals:   04/01/18 1312  TempSrc: Temporal         Complications: No apparent anesthesia complications

## 2018-04-01 NOTE — Anesthesia Preprocedure Evaluation (Addendum)
Anesthesia Evaluation  Patient identified by MRN, date of birth, ID band Patient awake    Reviewed: Allergy & Precautions, NPO status , Patient's Chart, lab work & pertinent test results, reviewed documented beta blocker date and time   Airway Mallampati: III  TM Distance: >3 FB     Dental  (+) Chipped   Pulmonary           Cardiovascular hypertension, Pt. on medications      Neuro/Psych    GI/Hepatic GERD  Controlled,  Endo/Other    Renal/GU Renal disease     Musculoskeletal   Abdominal   Peds  Hematology   Anesthesia Other Findings Gout. Not allergic to fentanyl.  Reproductive/Obstetrics                            Anesthesia Physical Anesthesia Plan  ASA: III  Anesthesia Plan: Spinal   Post-op Pain Management:    Induction:   PONV Risk Score and Plan:   Airway Management Planned:   Additional Equipment:   Intra-op Plan:   Post-operative Plan:   Informed Consent: I have reviewed the patients History and Physical, chart, labs and discussed the procedure including the risks, benefits and alternatives for the proposed anesthesia with the patient or authorized representative who has indicated his/her understanding and acceptance.     Plan Discussed with: CRNA  Anesthesia Plan Comments:         Anesthesia Quick Evaluation

## 2018-04-01 NOTE — Anesthesia Post-op Follow-up Note (Signed)
Anesthesia QCDR form completed.        

## 2018-04-01 NOTE — NC FL2 (Signed)
  [place] LEVEL OF CARE SCREENING TOOL     IDENTIFICATION  Patient Name: Scott Spence Birthdate: 11/13/1950 Sex: male Admission Date (Current Location): 04/01/2018  [employer] and [place] Number:  Engineering geologist and Address:  [facility], [address] [place], [place] [address]      Provider Number: [id number]  Attending Physician Name and Address:  Hessie Knows, MD  Relative Name and Phone Number:       Current Level of Care: Hospital Recommended Level of Care: [place] Prior Approval Number:    Date Approved/Denied:   PASRR Number: [id number])  Discharge Plan: SNF    Current Diagnoses: Patient Active Problem List   Diagnosis Date Noted  . Status post total hip replacement, right 04/01/2018    Orientation RESPIRATION BLADDER Height & Weight     Self, Time, Situation, Place  Normal Continent Weight:   Height:     BEHAVIORAL SYMPTOMS/MOOD NEUROLOGICAL BOWEL NUTRITION STATUS      Continent Diet(Diet: Regular)  AMBULATORY STATUS COMMUNICATION OF NEEDS Skin   Extensive Assist Verbally Surgical wounds, Wound Vac(Incision: Right Hip. Provena Wound Vac. )                       Personal Care Assistance Level of Assistance  Bathing, Feeding, Dressing Bathing Assistance: Limited assistance Feeding assistance: Independent Dressing Assistance: Limited assistance     Functional Limitations Info  Sight, Hearing, Speech Sight Info: Adequate Hearing Info: Adequate Speech Info: Adequate    SPECIAL CARE FACTORS FREQUENCY  PT (By licensed PT), OT (By licensed OT)     PT Frequency: (5) OT Frequency: (5)            Contractures      Additional Factors Info  Code Status, Allergies Code Status Info: (not on file. ) Allergies Info: (Meperidine, Sulfa Antibiotics, Gabapentin, Lisinopril)           Current Medications (04/01/2018):  This is the current hospital active medication list Current  Facility-Administered Medications  Medication Dose Route Frequency Provider Last Rate Last Dose  . ceFAZolin (ANCEF) 2-4 GM/100ML-% IVPB           . fentaNYL (SUBLIMAZE) injection 25 mcg  25 mcg Intravenous Q5 min PRN Gunnar Bulla, MD      . HYDROcodone-acetaminophen (NORCO/VICODIN) 5-325 MG per tablet 1-2 tablet  1-2 tablet Oral Q4H PRN Hessie Knows, MD      . lactated ringers infusion   Intravenous Continuous Gunnar Bulla, MD 75 mL/hr at 04/01/18 1327    . ondansetron (ZOFRAN) injection 4 mg  4 mg Intravenous Once PRN Gunnar Bulla, MD         Discharge Medications: Please see discharge summary for a list of discharge medications.  Relevant Imaging Results:  Relevant Lab Results:   Additional Information (SSN: [id number])  Gavino Fouch, Veronia Beets, LCSW

## 2018-04-01 NOTE — Op Note (Signed)
04/01/2018  3:21 PM  PATIENT:  Scott Spence  67 y.o. male  PRE-OPERATIVE DIAGNOSIS:  PRIMARY OSTEOARTHRITIS OF RIGHT HIP  POST-OPERATIVE DIAGNOSIS:  PRIMARY OSTEOARTHRITIS OF RIGHT HIP  PROCEDURE:  Procedure(s): TOTAL HIP ARTHROPLASTY ANTERIOR APPROACH (Right)  SURGEON: Laurene Footman, MD  ASSISTANTS: none  ANESTHESIA:   spinal  EBL:  Total I/O In: -  Out: 250 [Urine:100; Blood:150]  BLOOD ADMINISTERED:none  DRAINS: none   LOCAL MEDICATIONS USED:  MARCAINE     SPECIMEN:  Source of Specimen:  Right femoral head  DISPOSITION OF SPECIMEN:  PATHOLOGY  COUNTS:  YES  TOURNIQUET:  * No tourniquets in log *  IMPLANTS: Medacta AMIS 3 standard stem with 54 mm Mpact TM cup and liner with S metal 28 mm head  DICTATION: .Dragon Dictation   The patient was brought to the operating room and after spinal anesthesia was obtained patient was placed on the operative table with the ipsilateral foot into the Medacta attachment, contralateral leg on a well-padded table. C-arm was brought in and preop template x-ray taken. After prepping and draping in usual sterile fashion appropriate patient identification and timeout procedures were completed. Anterior approach to the hip was obtained and centered over the greater trochanter and TFL muscle. The subcutaneous tissue was incised hemostasis being achieved by electrocautery. TFL fascia was incised and the muscle retracted laterally deep retractor placed. The lateral femoral circumflex vessels were identified and ligated. The anterior capsule was exposed and a capsulotomy performed. The neck was identified and a femoral neck cut carried out with a saw. The head was removed without difficulty and showed sclerotic femoral head and acetabulum. Reaming was carried out to 54 mm and a 54 mm cup trial gave appropriate tightness to the acetabular component a 54 cup was impacted into position. The leg was then externally rotated and ischiofemoral and pubofemoral  releases carried out. The femur was sequentially broached to a size 3, size 3 standard with S head trials were placed and the final components chosen. The 3 standard stem was inserted along with a metal S 28 mm head and 54 mm liner. The hip was reduced and was stable the wound was thoroughly irrigated with fibrillar placed along the posterior capsule and medial neck. The deep fascia ws closed using a heavy Quill after infiltration of 30 cc of quarter percent Sensorcaine with epinephrine.3-0 V-loc to close the skin with skin staples.  Incisional wound VAC applied  PLAN OF CARE: Admit to inpatient

## 2018-04-01 NOTE — Progress Notes (Signed)
Pt requesting dose of pantoprazole 80 mg. Dr Roland Rack notified. Received order for medication

## 2018-04-02 ENCOUNTER — Encounter: Payer: Self-pay | Admitting: Orthopedic Surgery

## 2018-04-02 NOTE — Progress Notes (Signed)
  Subjective: 1 Day Post-Op Procedure(s) (LRB): TOTAL HIP ARTHROPLASTY ANTERIOR APPROACH (Right) Patient reports pain as moderate.   Patient seen in rounds with Dr. Rudene Christians. Patient is well, and has had no acute complaints or problems Plan is to go Home after hospital stay. Negative for chest pain and shortness of breath Fever: Low-grade at 99.2 Gastrointestinal: Negative for nausea and vomiting  Objective: Vital signs in last 24 hours: Temp:  [97 F (36.1 C)-99.2 F (37.3 C)] 99.2 F (37.3 C) (09/11 0541) Pulse Rate:  [56-84] 76 (09/11 0541) Resp:  [10-23] 18 (09/11 0541) BP: (91-162)/(60-98) 143/80 (09/11 0541) SpO2:  [93 %-100 %] 96 % (09/11 0541) Weight:  [86.1 kg] 86.1 kg (09/10 1808)  Intake/Output from previous day:  Intake/Output Summary (Last 24 hours) at 04/02/2018 0638 Last data filed at 04/02/2018 0500 Gross per 24 hour  Intake 1780 ml  Output 1050 ml  Net 730 ml    Intake/Output this shift: Total I/O In: -  Out: 650 [Urine:650]  Labs: No results for input(s): HGB in the last 72 hours. No results for input(s): WBC, RBC, HCT, PLT in the last 72 hours. No results for input(s): NA, K, CL, CO2, BUN, CREATININE, GLUCOSE, CALCIUM in the last 72 hours. No results for input(s): LABPT, INR in the last 72 hours.   EXAM General - Patient is Alert and Oriented Extremity - Sensation intact distally Dorsiflexion/Plantar flexion intact No cellulitis present Compartment soft Dressing/Incision - clean, dry, with the wound VAC in place Motor Function - intact, moving foot and toes well on exam.   Past Medical History:  Diagnosis Date  . Chronic kidney disease    hemorrhagic nephritis as a child  . Complication of anesthesia    collapsed lung with local for LUE surgery  . GERD (gastroesophageal reflux disease)   . Gout   . History of kidney stones   . Hypertension     Assessment/Plan: 1 Day Post-Op Procedure(s) (LRB): TOTAL HIP ARTHROPLASTY ANTERIOR APPROACH  (Right) Active Problems:   Status post total hip replacement, right  Estimated body mass index is 29.73 kg/m as calculated from the following:   Height as of this encounter: 5\' 7"  (1.702 m).   Weight as of this encounter: 86.1 kg. Advance diet Up with therapy D/C IV fluids Discharge home with home health possibly tomorrow or Friday  DVT Prophylaxis - Aspirin, Foot Pumps and TED hose Weight-Bearing as tolerated to right leg  Reche Dixon, PA-C Orthopaedic Surgery 04/02/2018, 6:38 AM

## 2018-04-02 NOTE — Progress Notes (Signed)
Physical Therapy Treatment Patient Details Name: Scott Spence MRN: [id number] DOB: 11/20/1950 Today's Date: 04/02/2018    History of Present Illness admitted for acute hospitalization status post R THR, anterior approach, WBAT (04/01/18)    PT Comments    Continues to progress well with all mobility efforts, demonstrating increased comfort and confidence in mobility tasks.  Remains somewhat slow and guarded with gait trials, but appears to be near patient's preferred pace (limited ability to modulate with cuing from therapist).  Plan to initiate stair training and issue formal HEP next date.    Follow Up Recommendations  Outpatient PT     Equipment Recommendations  Rolling walker with 5" wheels;3in1 (PT)    Recommendations for Other Services       Precautions / Restrictions Precautions Precautions: Fall;Anterior Hip Restrictions Weight Bearing Restrictions: Yes RLE Weight Bearing: Weight bearing as tolerated    Mobility  Bed Mobility Overal bed mobility: Needs Assistance Bed Mobility: Supine to Sit;Sit to Supine     Supine to sit: Supervision Sit to supine: Supervision   General bed mobility comments: educated in importance of R LE positioning/protection with bed mobility; patient voiced/demonstrated understanding  Transfers Overall transfer level: Needs assistance Equipment used: Rolling walker (2 wheeled) Transfers: Sit to/from Stand Sit to Stand: Supervision            Ambulation/Gait Ambulation/Gait assistance: Supervision Gait Distance (Feet): 200 Feet Assistive device: Rolling walker (2 wheeled)       General Gait Details: improved relaxation of R LE throughout gait cycle with subsequent improvement in gait mechanics (R LE knee flexion, ankle pf/df); min cuing to increase cadence and overall gait speed as tolerated.  Voices understanding, but demonstrates limited ability to modulate gait speed   Stairs             Wheelchair Mobility     Modified Rankin (Stroke Patients Only)       Balance Overall balance assessment: Needs assistance Sitting-balance support: No upper extremity supported;Feet supported Sitting balance-Leahy Scale: Good     Standing balance support: Bilateral upper extremity supported Standing balance-Leahy Scale: Fair                              Cognition Arousal/Alertness: Awake/alert Behavior During Therapy: WFL for tasks assessed/performed Overall Cognitive Status: Within Functional Limits for tasks assessed                                        Exercises Other Exercises Other Exercises: Supine R LE therex, 1x10, AROM for muscular strength/endurance: ankle pumps, quad sets, SAQs, heel slides, hip abduct/adduct.  Good tolerance for isolated ROM    General Comments        Pertinent Vitals/Pain Pain Assessment: No/denies pain Pain Score: 4  Pain Location: R hip Pain Descriptors / Indicators: Aching;Guarding;Grimacing Pain Intervention(s): Limited activity within patient's tolerance;Monitored during session;Premedicated before session;Repositioned    Home Living                      Prior Function            PT Goals (current goals can now be found in the care plan section) Acute Rehab PT Goals Patient Stated Goal: to return home PT Goal Formulation: With patient Time For Goal Achievement: 04/16/18 Potential to Achieve Goals: Good Progress towards PT goals:  Progressing toward goals    Frequency    BID      PT Plan Current plan remains appropriate    Co-evaluation              AM-PAC PT "6 Clicks" Daily Activity  Outcome Measure  Difficulty turning over in bed (including adjusting bedclothes, sheets and blankets)?: A Little Difficulty moving from lying on back to sitting on the side of the bed? : A Little Difficulty sitting down on and standing up from a chair with arms (e.g., wheelchair, bedside commode, etc,.)?:  Unable Help needed moving to and from a bed to chair (including a wheelchair)?: A Little Help needed walking in hospital room?: A Little Help needed climbing 3-5 steps with a railing? : A Little 6 Click Score: 16    End of Session Equipment Utilized During Treatment: Gait belt Activity Tolerance: Patient tolerated treatment well Patient left: in bed;with call bell/phone within reach;with bed alarm set;with family/visitor present Nurse Communication: Mobility status PT Visit Diagnosis: Muscle weakness (generalized) (M62.81);Difficulty in walking, not elsewhere classified (R26.2)     Time: 1422-1450 PT Time Calculation (min) (ACUTE ONLY): 28 min  Charges:  $Gait Training: 8-22 mins $Therapeutic Exercise: 8-22 mins                     Belvia Gotschall H. Owens Shark, PT, DPT, NCS 04/02/18, 3:35 PM [phone]

## 2018-04-02 NOTE — Progress Notes (Signed)
Clinical Social Worker (CSW) received SNF consult. PT is recommending outpatient PT. RN case manager aware of above. Please reconsult if future social work needs arise. CSW signing off.   Akshita Italiano, LCSW [phone]  

## 2018-04-02 NOTE — Anesthesia Postprocedure Evaluation (Signed)
Anesthesia Post Note  Patient: Scott Spence  Procedure(s) Performed: TOTAL HIP ARTHROPLASTY ANTERIOR APPROACH (Right Hip)  Patient location during evaluation: Nursing Unit Anesthesia Type: Spinal Level of consciousness: oriented and awake and alert Pain management: pain level controlled Vital Signs Assessment: post-procedure vital signs reviewed and stable Respiratory status: spontaneous breathing Cardiovascular status: blood pressure returned to baseline and stable Postop Assessment: no headache, no backache, no apparent nausea or vomiting, spinal receding and patient able to bend at knees Anesthetic complications: no     Last Vitals:  Vitals:   04/01/18 2231 04/02/18 0541  BP: 125/80 (!) 143/80  Pulse: 66 76  Resp: 19 18  Temp: 36.7 C 37.3 C  SpO2: 98% 96%    Last Pain:  Vitals:   04/02/18 0541  TempSrc: Oral  PainSc:                  Scott Spence

## 2018-04-02 NOTE — Evaluation (Signed)
Physical Therapy Evaluation Patient Details Name: Scott Spence MRN: [id number] DOB: 09-12-1950 Today's Date: 04/02/2018   History of Present Illness  admitted for acute hospitalization status post R THR, anterior approach, WBAT (04/01/18)  Clinical Impression  Upon evaluation, patient alert and oriented; follows all commands and demonstrates excellent effort with all mobility tasks.  Very eager for mobility as tolerated.  R hip grossly 3-/5 (limited by pain); otherwise, strength and ROM grossly WFL.  Demonstrates ability to complete sit/stand, basic transfers and gait (200') with RW, cga/close sup.  Slow and slightly guarded, but good stability; good R LE stance time/weight acceptance.  Patient very motivated to participate/progress; anticipate consistent progress towards all mobility goals throughout remaining hospitalization. Would benefit from skilled PT to address above deficits and promote optimal return to PLOF; recommend transition to home with outpatient PT follow up as appropriate.    Follow Up Recommendations Outpatient PT    Equipment Recommendations  Rolling walker with 5" wheels;3in1 (PT)    Recommendations for Other Services       Precautions / Restrictions Precautions Precautions: Fall;Anterior Hip Restrictions Weight Bearing Restrictions: Yes RLE Weight Bearing: Weight bearing as tolerated      Mobility  Bed Mobility               General bed mobility comments: seated in recliner beginning/end of treatment session  Transfers Overall transfer level: Needs assistance Equipment used: Rolling walker (2 wheeled) Transfers: Sit to/from Stand Sit to Stand: Min guard;Supervision         General transfer comment: cuing for hand placement to maximize safety  Ambulation/Gait Ambulation/Gait assistance: Min guard Gait Distance (Feet): 200 Feet Assistive device: Rolling walker (2 wheeled)     Gait velocity interpretation: <1.31 ft/sec, indicative of household  ambulator General Gait Details: reciprocal stepping pattern with good R LE stance time/weight acceptance; mildly guarded with R LE mechanics (decreased knee/ankle ROM) due to pain.  Decreased cadence and gait speed; no buckling, LOB or safety concern.  Stairs            Wheelchair Mobility    Modified Rankin (Stroke Patients Only)       Balance Overall balance assessment: Needs assistance Sitting-balance support: No upper extremity supported;Feet supported Sitting balance-Leahy Scale: Good     Standing balance support: Bilateral upper extremity supported Standing balance-Leahy Scale: Fair                               Pertinent Vitals/Pain Pain Assessment: 0-10 Pain Score: 4  Pain Location: R hip Pain Descriptors / Indicators: Aching;Guarding;Grimacing Pain Intervention(s): Limited activity within patient's tolerance;Monitored during session;Patient requesting pain meds-RN notified;Repositioned    Home Living Family/patient expects to be discharged to:: Private residence Living Arrangements: Spouse/significant other Available Help at Discharge: Family Type of Home: House Home Access: Stairs to enter Entrance Stairs-Rails: Left   Home Layout: One level Home Equipment: Cane - single point      Prior Function Level of Independence: Independent with assistive device(s)         Comments: Indep with ADLs, household and community mobilization; recent use of SPC due to worsening R hip pain.  Denies fall history.     Hand Dominance   Dominant Hand: Right    Extremity/Trunk Assessment   Upper Extremity Assessment Upper Extremity Assessment: Overall WFL for tasks assessed    Lower Extremity Assessment Lower Extremity Assessment: Generalized weakness(R hip grossly 3-/5, limited by pain; R  knee and ankle at least 4+/5.  L LE grossly WFL.)       Communication   Communication: No difficulties  Cognition Arousal/Alertness: Awake/alert Behavior  During Therapy: WFL for tasks assessed/performed Overall Cognitive Status: Within Functional Limits for tasks assessed                                        General Comments      Exercises Other Exercises Other Exercises: Toilet transfer, ambulatory with RW, cga/close sup-good walker position and control; good safety/insight.  Sit/stand and static standing balance from RW, close sup; fair active use/WBing R LE with movement transitions  Patient reporting active lifestyle, daily exercise habits (elliptical, planks, push ups); questions about ability to return.  Encouraged patient to focus on isolated therex (will provide during hospitalization) and progressive mobility during initial recovery, to refrain from more intensive activities until cleared by physician after initial follow up visit.  Patient voiced understanding.   Assessment/Plan    PT Assessment Patient needs continued PT services  PT Problem List Decreased strength;Decreased range of motion;Decreased activity tolerance;Decreased balance;Decreased mobility;Decreased coordination;Decreased knowledge of use of DME;Decreased safety awareness;Decreased knowledge of precautions       PT Treatment Interventions DME instruction;Gait training;Stair training;Functional mobility training;Therapeutic activities;Balance training;Therapeutic exercise;Patient/family education    PT Goals (Current goals can be found in the Care Plan section)  Acute Rehab PT Goals Patient Stated Goal: to return home PT Goal Formulation: With patient Time For Goal Achievement: 04/16/18 Potential to Achieve Goals: Good    Frequency BID   Barriers to discharge        Co-evaluation               AM-PAC PT "6 Clicks" Daily Activity  Outcome Measure Difficulty turning over in bed (including adjusting bedclothes, sheets and blankets)?: A Little Difficulty moving from lying on back to sitting on the side of the bed? : A  Little Difficulty sitting down on and standing up from a chair with arms (e.g., wheelchair, bedside commode, etc,.)?: Unable Help needed moving to and from a bed to chair (including a wheelchair)?: A Little Help needed walking in hospital room?: A Little Help needed climbing 3-5 steps with a railing? : A Little 6 Click Score: 16    End of Session Equipment Utilized During Treatment: Gait belt Activity Tolerance: Patient tolerated treatment well Patient left: in chair;with call bell/phone within reach;with chair alarm set Nurse Communication: Mobility status PT Visit Diagnosis: Muscle weakness (generalized) (M62.81);Difficulty in walking, not elsewhere classified (R26.2)    Time: 9244-6286 PT Time Calculation (min) (ACUTE ONLY): 20 min   Charges:   PT Evaluation $PT Eval Low Complexity: 1 Low PT Treatments $Therapeutic Activity: 8-22 mins       Voula Waln H. Owens Shark, PT, DPT, NCS 04/02/18, 9:33 AM [phone]

## 2018-04-02 NOTE — Progress Notes (Signed)
Pt alert and oriented. Medicated for pain with minimal relief.pt has voided post foley removal. Tolerated sitting on side of bed. No acute distress noted. Staff will continue to monitor and assess.

## 2018-04-02 NOTE — Progress Notes (Signed)
OT Cancellation Note  Patient Details Name: Scott Spence MRN: [id number] DOB: 1950-09-08   Cancelled Treatment:    Reason Eval/Treat Not Completed: Other (comment).  Order received, chart reviewed. Per PT, pt just got back to bed to rest, fatigued after 2nd PT session this afternoon. Will hold OT this date and re-attempt OT evaluation next date to address ADL prior to discharge.  Jeni Salles, MPH, MS, OTR/L ascom [phone] 04/02/18, 5:18 PM

## 2018-04-02 NOTE — Discharge Instructions (Signed)
ANTERIOR APPROACH TOTAL HIP REPLACEMENT POSTOPERATIVE DIRECTIONS   Hip Rehabilitation, Guidelines Following Surgery  The results of a hip operation are greatly improved after range of motion and muscle strengthening exercises. Follow all safety measures which are given to protect your hip. If any of these exercises cause increased pain or swelling in your joint, decrease the amount until you are comfortable again. Then slowly increase the exercises. Call your caregiver if you have problems or questions.   HOME CARE INSTRUCTIONS  Remove items at home which could result in a fall. This includes throw rugs or furniture in walking pathways.   ICE to the affected hip every three hours for 30 minutes at a time and then as needed for pain and swelling.  Continue to use ice on the hip for pain and swelling from surgery. You may notice swelling that will progress down to the foot and ankle.  This is normal after surgery.  Elevate the leg when you are not up walking on it.    Continue to use the breathing machine which will help keep your temperature down.  It is common for your temperature to cycle up and down following surgery, especially at night when you are not up moving around and exerting yourself.  The breathing machine keeps your lungs expanded and your temperature down.  Do not place pillow under knee, focus on keeping the knee straight while resting  DIET You may resume your previous home diet once your are discharged from the hospital.  DRESSING / WOUND CARE / SHOWERING The wound VAC will remain in place on the hip incision until removed by the physical therapist on the fifth day. Keep the surgical dressing until follow up.  The dressing is water proof, so you can shower without any extra covering.  IF THE DRESSING FALLS OFF or the wound gets wet inside, change the dressing with sterile gauze.  Please use good hand washing techniques before changing the dressing.  Do not use any lotions or  creams on the incision until instructed by your surgeon.   Keep your dressing dry with showering.  You can keep it covered and pat dry. Change the surgical dressing daily and reapply a dry dressing each time.  ACTIVITY Walk with your walker as instructed. Use walker as long as suggested by your caregivers. Avoid periods of inactivity such as sitting longer than an hour when not asleep. This helps prevent blood clots.  You may resume a sexual relationship in one month or when given the OK by your doctor.  You may return to work once you are cleared by your doctor.  Do not drive a car for 6 weeks or until released by you surgeon.  Do not drive while taking narcotics.  WEIGHT BEARING Weight bearing as tolerated with assist device (walker, cane, etc) as directed, use it as long as suggested by your surgeon or therapist, typically at least 4-6 weeks.  POSTOPERATIVE CONSTIPATION PROTOCOL Constipation - defined medically as fewer than three stools per week and severe constipation as less than one stool per week.  One of the most common issues patients have following surgery is constipation.  Even if you have a regular bowel pattern at home, your normal regimen is likely to be disrupted due to multiple reasons following surgery.  Combination of anesthesia, postoperative narcotics, change in appetite and fluid intake all can affect your bowels.  In order to avoid complications following surgery, here are some recommendations in order to help you during  your recovery period.  Colace (docusate) - Pick up an over-the-counter form of Colace or another stool softener and take twice a day as long as you are requiring postoperative pain medications.  Take with a full glass of water daily.  If you experience loose stools or diarrhea, hold the colace until you stool forms back up.  If your symptoms do not get better within 1 week or if they get worse, check with your doctor.  Dulcolax (bisacodyl) - Pick up  over-the-counter and take as directed by the product packaging as needed to assist with the movement of your bowels.  Take with a full glass of water.  Use this product as needed if not relieved by Colace only.   MiraLax (polyethylene glycol) - Pick up over-the-counter to have on hand.  MiraLax is a solution that will increase the amount of water in your bowels to assist with bowel movements.  Take as directed and can mix with a glass of water, juice, soda, coffee, or tea.  Take if you go more than two days without a movement. Do not use MiraLax more than once per day. Call your doctor if you are still constipated or irregular after using this medication for 7 days in a row.  If you continue to have problems with postoperative constipation, please contact the office for further assistance and recommendations.  If you experience "the worst abdominal pain ever" or develop nausea or vomiting, please contact the office immediatly for further recommendations for treatment.  ITCHING  If you experience itching with your medications, try taking only a single pain pill, or even half a pain pill at a time.  You can also use Benadryl over the counter for itching or also to help with sleep.   TED HOSE STOCKINGS Wear the elastic stockings on both legs for three weeks following surgery during the day but you may remove then at night for sleeping.  MEDICATIONS See your medication summary on the After Visit Summary that the nursing staff will review with you prior to discharge.  You may have some home medications which will be placed on hold until you complete the course of blood thinner medication.  It is important for you to complete the blood thinner medication as prescribed by your surgeon.  Continue your approved medications as instructed at time of discharge.  PRECAUTIONS If you experience chest pain or shortness of breath - call 911 immediately for transfer to the hospital emergency department.  If you  develop a fever greater that 101 F, purulent drainage from wound, increased redness or drainage from wound, foul odor from the wound/dressing, or calf pain - CONTACT YOUR SURGEON.                                                   FOLLOW-UP APPOINTMENTS Make sure you keep all of your appointments after your operation with your surgeon and caregivers. You should call the office at the above phone number and make an appointment for approximately two weeks after the date of your surgery or on the date instructed by your surgeon outlined in the "After Visit Summary".  RANGE OF MOTION AND STRENGTHENING EXERCISES  These exercises are designed to help you keep full movement of your hip joint. Follow your caregiver's or physical therapist's instructions. Perform all exercises about fifteen times, three times  per day or as directed. Exercise both hips, even if you have had only one joint replacement. These exercises can be done on a training (exercise) mat, on the floor, on a table or on a bed. Use whatever works the best and is most comfortable for you. Use music or television while you are exercising so that the exercises are a pleasant break in your day. This will make your life better with the exercises acting as a break in routine you can look forward to.  Lying on your back, slowly slide your foot toward your buttocks, raising your knee up off the floor. Then slowly slide your foot back down until your leg is straight again.  Lying on your back spread your legs as far apart as you can without causing discomfort.  Lying on your side, raise your upper leg and foot straight up from the floor as far as is comfortable. Slowly lower the leg and repeat.  Lying on your back, tighten up the muscle in the front of your thigh (quadriceps muscles). You can do this by keeping your leg straight and trying to raise your heel off the floor. This helps strengthen the largest muscle supporting your knee.  Lying on your back,  tighten up the muscles of your buttocks both with the legs straight and with the knee bent at a comfortable angle while keeping your heel on the floor.   IF YOU ARE TRANSFERRED TO A SKILLED REHAB FACILITY If the patient is transferred to a skilled rehab facility following release from the hospital, a list of the current medications will be sent to the facility for the patient to continue.  When discharged from the skilled rehab facility, please have the facility set up the patient's [place] prior to being released. Also, the skilled facility will be responsible for providing the patient with their medications at time of release from the facility to include their pain medication, the muscle relaxants, and their blood thinner medication. If the patient is still at the rehab facility at time of the two week follow up appointment, the skilled rehab facility will also need to assist the patient in arranging follow up appointment in our office and any transportation needs.  MAKE SURE YOU:  Understand these instructions.  Get help right away if you are not doing well or get worse.    Pick up stool softner and laxative for home use following surgery while on pain medications. Do not submerge incision under water. Please use good hand washing techniques while changing dressing each day. May shower starting three days after surgery. Please use a clean towel to pat the incision dry following showers. Continue to use ice for pain and swelling after surgery. Do not use any lotions or creams on the incision until instructed by your surgeon.

## 2018-04-03 LAB — SURGICAL PATHOLOGY

## 2018-04-03 MED ORDER — HYDROCODONE-ACETAMINOPHEN 5-325 MG PO TABS: 1.0000 | ORAL_TABLET | ORAL | 0 refills | Status: DC | PRN

## 2018-04-03 MED ORDER — BISACODYL 10 MG RE SUPP
10.0000 mg | Freq: Once | RECTAL | Status: AC
  Administered 2018-04-03: 10 mg via RECTAL
  Filled 2018-04-03: qty 1

## 2018-04-03 MED ORDER — ASPIRIN 325 MG PO TBEC: 325.0000 mg | DELAYED_RELEASE_TABLET | Freq: Every day | ORAL | 0 refills | Status: DC

## 2018-04-03 MED ORDER — TRAMADOL HCL 50 MG PO TABS: 50.0000 mg | ORAL_TABLET | Freq: Four times a day (QID) | ORAL | 1 refills | Status: DC

## 2018-04-03 NOTE — Evaluation (Signed)
Occupational Therapy Evaluation Patient Details Name: Scott Spence MRN: [id number] DOB: 1951/04/22 Today's Date: 04/03/2018    History of Present Illness 67yo male pt admitted for acute hospitalization status post R THR, anterior approach, WBAT (04/01/18).   Clinical Impression   Pt seen for OT evaluation this date, POD#1 from above surgery. Pt was independent in all ADLs prior to surgery, however occasionally using SPC for mobility due to R hip pain and unable to drive due to pain for past 2 months. Pt is eager to return to PLOF with less pain and improved safety and independence. Pt currently requires PRN minimal assist for LB dressing and bathing while in seated position due to pain and limited AROM of R hip. Pt instructed in self care skills, falls prevention strategies, home/routines modifications, DME/AE for LB bathing and dressing tasks, and compression stocking mgt strategies. Pt verbalized understanding of all education/training provided. Handout provided to support recall and carryover. No additional OT needs at this time. Will sign off.     Follow Up Recommendations  No OT follow up    Equipment Recommendations  3 in 1 bedside commode;Other (comment)(reacher)    Recommendations for Other Services       Precautions / Restrictions Precautions Precautions: Fall;Anterior Hip Restrictions Weight Bearing Restrictions: Yes RLE Weight Bearing: Weight bearing as tolerated      Mobility Bed Mobility               General bed mobility comments: deferred, up in [place] transfer comment: deferred, pt reporting feeling "loopy" as medicine was "kicking in", so mobility deferred    Balance Overall balance assessment: Needs assistance Sitting-balance support: No upper extremity supported;Feet supported Sitting balance-Leahy Scale: Good                                     ADL either performed or assessed with clinical  judgement   ADL Overall ADL's : Needs assistance/impaired             Lower Body Bathing: Sit to/from stand;With caregiver independent assisting;Minimal assistance Lower Body Bathing Details (indicate cue type and reason): PRN min assist. Pt educated in [facility] sponge to improve independence     Lower Body Dressing: Sit to/from stand;Minimal assistance;With caregiver independent assisting Lower Body Dressing Details (indicate cue type and reason): PRN min assist. Pt instructed in AE for LB dressing and compression stocking mgt (pt will require max assist with that)   Toilet Transfer Details (indicate cue type and reason): pt educated in use of BSC over toilet at home and use of urinal for overnight toileting needs                 Vision Baseline Vision/History: Wears glasses Wears Glasses: Reading only Patient Visual Report: No change from baseline       Perception     Praxis      Pertinent Vitals/Pain Pain Assessment: 0-10 Pain Score: 4  Pain Location: R hip Pain Descriptors / Indicators: Aching;Guarding;Grimacing Pain Intervention(s): Limited activity within patient's tolerance;Monitored during session;Premedicated before session;Repositioned;Ice applied     Hand Dominance Right   Extremity/Trunk Assessment Upper Extremity Assessment Upper Extremity Assessment: Overall WFL for tasks assessed   Lower Extremity Assessment Lower Extremity Assessment: Defer to PT evaluation;RLE deficits/detail RLE Deficits / Details: R hip grossly 3-/5,  limited by pain; R knee and ankle at least 4+/5   Cervical / Trunk Assessment Cervical / Trunk Assessment: Normal   Communication Communication Communication: No difficulties   Cognition Arousal/Alertness: Awake/alert;Suspect due to medications Behavior During Therapy: [facility] for tasks assessed/performed Overall Cognitive Status: Within Functional Limits for tasks assessed                                 General  Comments: pt endorses feeling a little "loopy" near end of session with medication "kicking in"   General Comments       Exercises Other Exercises Other Exercises: Pt instructed in falls prevention strategies  Other Exercises: Pt encouraged to speak with MD and therapist for guidance on when he could return to driving and his typical exercise/activity   Shoulder Instructions      Home Living Family/patient expects to be discharged to:: Private residence Living Arrangements: Spouse/significant other Available Help at Discharge: Family;Available 24 hours/day Type of Home: House Home Access: Stairs to enter [employer] of Steps: 3-4 Entrance Stairs-Rails: Left Home Layout: One level     Bathroom Shower/Tub: Occupational psychologist: Handicapped height     Home Equipment: Cane - single point          Prior Functioning/Environment Level of Independence: Independent with assistive device(s)        Comments: Indep with ADLs, household and community mobilization; recent use of SPC due to worsening R hip pain. Denies fall history. Very active typically.        OT Problem List:        OT Treatment/Interventions:      OT Goals(Current goals can be found in the care plan section) Acute Rehab OT Goals Patient Stated Goal: to return home OT Goal Formulation: All assessment and education complete, DC therapy  OT Frequency:     Barriers to D/C:            Co-evaluation              AM-PAC PT "6 Clicks" Daily Activity     Outcome Measure Help from another person eating meals?: None Help from another person taking care of personal grooming?: None Help from another person toileting, which includes using toliet, bedpan, or urinal?: A Little Help from another person bathing (including washing, rinsing, drying)?: A Little Help from another person to put on and taking off regular upper body clothing?: None Help from another person to put on and  taking off regular lower body clothing?: A Little 6 Click Score: 21   End of Session    Activity Tolerance: Patient tolerated treatment well Patient left: in chair;with call bell/phone within reach;with chair alarm set  OT Visit Diagnosis: Other abnormalities of gait and mobility (R26.89)                Time: 5176-1607 OT Time Calculation (min): 20 min Charges:  OT General Charges $OT Visit: 1 Visit OT Evaluation $OT Eval Low Complexity: 1 Low OT Treatments $Self Care/Home Management : 8-22 mins  Jeni Salles, MPH, MS, OTR/L ascom [phone] 04/03/18, 9:36 AM

## 2018-04-03 NOTE — Progress Notes (Signed)
  Subjective: 2 Days Post-Op Procedure(s) (LRB): TOTAL HIP ARTHROPLASTY ANTERIOR APPROACH (Right) Patient reports pain as mild to moderate.   Patient seen in rounds with Dr. Rudene Christians. Patient is well, and has had no acute complaints or problems.  Patient has been itching some. Plan is to go Home after hospital stay. Negative for chest pain and shortness of breath Fever: Low-grade at 99 Gastrointestinal: Negative for nausea and vomiting  Objective: Vital signs in last 24 hours: Temp:  [97.9 F (36.6 C)-99 F (37.2 C)] 99 F (37.2 C) (09/11 2321) Pulse Rate:  [66-81] 78 (09/11 2321) Resp:  [18] 18 (09/11 2321) BP: (124-142)/(80-94) 142/82 (09/11 2321) SpO2:  [96 %-98 %] 98 % (09/11 2321)  Intake/Output from previous day:  Intake/Output Summary (Last 24 hours) at 04/03/2018 0600 Last data filed at 04/02/2018 1914 Gross per 24 hour  Intake 1998.09 ml  Output 0 ml  Net 1998.09 ml    Intake/Output this shift: No intake/output data recorded.  Labs: No results for input(s): HGB in the last 72 hours. No results for input(s): WBC, RBC, HCT, PLT in the last 72 hours. No results for input(s): NA, K, CL, CO2, BUN, CREATININE, GLUCOSE, CALCIUM in the last 72 hours. No results for input(s): LABPT, INR in the last 72 hours.   EXAM General - Patient is Alert and Oriented Extremity - Sensation intact distally Dorsiflexion/Plantar flexion intact No cellulitis present Compartment soft Dressing/Incision - clean, dry, with the wound VAC in place Motor Function - intact, moving foot and toes well on exam.  Patient ambulated 200 feet with physical therapy  Past Medical History:  Diagnosis Date  . Chronic kidney disease    hemorrhagic nephritis as a child  . Complication of anesthesia    collapsed lung with local for LUE surgery  . GERD (gastroesophageal reflux disease)   . Gout   . History of kidney stones   . Hypertension     Assessment/Plan: 2 Days Post-Op Procedure(s)  (LRB): TOTAL HIP ARTHROPLASTY ANTERIOR APPROACH (Right) Active Problems:   Status post total hip replacement, right  Estimated body mass index is 29.73 kg/m as calculated from the following:   Height as of this encounter: 5\' 7"  (1.702 m).   Weight as of this encounter: 86.1 kg. Advance diet Up with therapy D/C IV fluids Discharge home with home health possibly today  DVT Prophylaxis - Aspirin, Foot Pumps and TED hose Weight-Bearing as tolerated to right leg  Reche Dixon, PA-C Orthopaedic Surgery 04/03/2018, 6:00 AM

## 2018-04-03 NOTE — Discharge Summary (Signed)
Physician Discharge Summary  Subjective: 2 Days Post-Op Procedure(s) (LRB): TOTAL HIP ARTHROPLASTY ANTERIOR APPROACH (Right) Patient reports pain as mild.   Patient seen in rounds with Dr. Rudene Christians. Patient is well, and has had no acute complaints or problems Patient is ready to go home with home health physical therapy  Physician Discharge Summary  Patient ID: Scott Spence MRN: [id number] DOB/AGE: 1950-07-27 67 y.o.  Admit date: 04/01/2018 Discharge date: 04/03/2018  Admission Diagnoses:  Discharge Diagnoses:  Active Problems:   Status post total hip replacement, right   Discharged Condition: good  Hospital Course: The patient is postop day 2 from a right anterior approach total hip replacement.  He is doing very well since surgery.  Postop day 1 the patient ambulated 200 feet with physical therapy.  The patient has a wound VAC in place which will remain there for 6 more days.  The patient will have to have a bowel movement before discharge.  He has been itching some from the pain medication.  Treatments: surgery:  TOTAL HIP ARTHROPLASTY ANTERIOR APPROACH (Right)  SURGEON: Laurene Footman, MD  ASSISTANTS: none  ANESTHESIA:   spinal  EBL:  Total I/O In: -  Out: 250 [Urine:100; Blood:150]  BLOOD ADMINISTERED:none  DRAINS: none   LOCAL MEDICATIONS USED:  MARCAINE     SPECIMEN:  Source of Specimen:  Right femoral head  DISPOSITION OF SPECIMEN:  PATHOLOGY  COUNTS:  YES  TOURNIQUET:  * No tourniquets in log *  IMPLANTS: Medacta AMIS 3 standard stem with 54 mm Mpact TM cup and liner with S metal 28 mm head  Discharge Exam: Blood pressure (!) 142/82, pulse 78, temperature 99 F (37.2 C), temperature source Oral, resp. rate 18, height 5\' 7"  (1.702 m), weight 86.1 kg, SpO2 98 %.   Disposition: Discharge disposition: 01-Home or Self Care        Allergies as of 04/03/2018      Reactions   Meperidine Other (See Comments)   GI Upset   Sulfa Antibiotics  Hives   Gabapentin Other (See Comments)   Migraine HA   Lisinopril Cough      Medication List    STOP taking these medications   meloxicam 15 MG tablet Commonly known as:  MOBIC     TAKE these medications   allopurinol 300 MG tablet Commonly known as:  ZYLOPRIM Take 300 mg by mouth daily.   aspirin 325 MG EC tablet Take 1 tablet (325 mg total) by mouth daily with breakfast.   calcium gluconate 500 MG tablet Take 1 tablet by mouth daily.   Coenzyme Q-10 200 MG Caps Take 200 mg by mouth daily.   Fish Oil 1000 MG Caps Take 1,000 mg by mouth daily.   GLUCOSAMINE 1500 COMPLEX PO Take 1,500 mg by mouth daily.   HYDROcodone-acetaminophen 5-325 MG tablet Commonly known as:  NORCO/VICODIN Take 1-2 tablets by mouth every 4 (four) hours as needed for moderate pain (pain score 4-6).   losartan 100 MG tablet Commonly known as:  COZAAR Take 100 mg by mouth daily.   Magnesium 250 MG Tabs Take 250 mg by mouth daily.   mometasone 50 MCG/ACT nasal spray Commonly known as:  NASONEX Place 1 spray into the nose 2 (two) times daily.   multivitamin capsule Take 1 capsule by mouth daily.   omeprazole 20 MG capsule Commonly known as:  PRILOSEC Take 20 mg by mouth daily.   RESTASIS 0.05 % ophthalmic emulsion Generic drug:  cycloSPORINE Place 2 drops into  both eyes 2 (two) times daily.   tamsulosin 0.4 MG Caps capsule Commonly known as:  FLOMAX Take 0.8 mg by mouth daily.   traMADol 50 MG tablet Commonly known as:  ULTRAM Take 1 tablet (50 mg total) by mouth every 6 (six) hours.   VITAMIN B 12 PO Take 3,000 mcg by mouth daily.   Vitamin D2 2000 units Tabs Take 2,000 Units by mouth daily.            Durable Medical Equipment  (From admission, onward)         Start     Ordered   04/01/18 1756  DME 3 n 1  Once     04/01/18 1755   04/01/18 1756  DME Walker rolling  Once    Question:  Patient needs a walker to treat with the following condition  Answer:  Status  post total hip replacement, right   04/01/18 1755   04/01/18 1756  DME Bedside commode  Once    Question:  Patient needs a bedside commode to treat with the following condition  Answer:  Status post total hip replacement, right   04/01/18 1755         Follow-up Information    Hessie Knows, MD. Go in 2 week(s).   Specialty:  Orthopedic Surgery Contact information: [address] [place] [address] [phone]           Signed: Prescott Parma, Anah Billard 04/03/2018, 6:05 AM   Objective: Vital signs in last 24 hours: Temp:  [97.9 F (36.6 C)-99 F (37.2 C)] 99 F (37.2 C) (09/11 2321) Pulse Rate:  [66-81] 78 (09/11 2321) Resp:  [18] 18 (09/11 2321) BP: (124-142)/(80-94) 142/82 (09/11 2321) SpO2:  [96 %-98 %] 98 % (09/11 2321)  Intake/Output from previous day:  Intake/Output Summary (Last 24 hours) at 04/03/2018 0605 Last data filed at 04/02/2018 1914 Gross per 24 hour  Intake 1998.09 ml  Output 0 ml  Net 1998.09 ml    Intake/Output this shift: No intake/output data recorded.  Labs: No results for input(s): HGB in the last 72 hours. No results for input(s): WBC, RBC, HCT, PLT in the last 72 hours. No results for input(s): NA, K, CL, CO2, BUN, CREATININE, GLUCOSE, CALCIUM in the last 72 hours. No results for input(s): LABPT, INR in the last 72 hours.  EXAM: General - Patient is Alert and Oriented Extremity - Sensation intact distally Dorsiflexion/Plantar flexion intact No cellulitis present Incision - clean, dry, wound VAC in place Motor Function -dorsiflexion and plantarflexion intact.  Ambulated 200 feet with physical therapy  Assessment/Plan: 2 Days Post-Op Procedure(s) (LRB): TOTAL HIP ARTHROPLASTY ANTERIOR APPROACH (Right) Procedure(s) (LRB): TOTAL HIP ARTHROPLASTY ANTERIOR APPROACH (Right) Past Medical History:  Diagnosis Date  . Chronic kidney disease    hemorrhagic nephritis as a child  . Complication of anesthesia     collapsed lung with local for LUE surgery  . GERD (gastroesophageal reflux disease)   . Gout   . History of kidney stones   . Hypertension    Active Problems:   Status post total hip replacement, right  Estimated body mass index is 29.73 kg/m as calculated from the following:   Height as of this encounter: 5\' 7"  (1.702 m).   Weight as of this encounter: 86.1 kg. Advance diet Up with therapy Discharge home with home health Diet - Regular diet Follow up - in 2 weeks Activity - WBAT Disposition - Home Condition Upon Discharge - Good  DVT Prophylaxis - Aspirin  Reche Dixon, PA-C Orthopaedic Surgery 04/03/2018, 6:05 AM

## 2018-04-03 NOTE — Progress Notes (Signed)
Patient is being discharged home with wife. IV with cath intact. Reviewed meds, scripts, and last dose given. Exercises, discharge instructions, and follow-up appointment reviewed. Equipment sent with patient.

## 2018-04-03 NOTE — Progress Notes (Signed)
Physical Therapy Treatment Patient Details Name: Scott Spence MRN: [id number] DOB: 07-10-1951 Today's Date: 04/03/2018    History of Present Illness 67yo male pt admitted for acute hospitalization status post R THR, anterior approach, WBAT (04/01/18).    PT Comments    Split session  8:44-9:00 Pt in recliner, asking to return to bed.  Stated he is "groggy" from medication and needs to lay down.  Stood with min guard and was able to ambulate around bed and transition to supine with min a x 1 for LE management. 10:15-10:33 To called Probation officer stating he was ready for session.  Sitting edge of bed upon arrival and was able to stand and ambulate around unit x 1 and complete stair training with left rail as he has at home.  Pt returned to bed per his request and has no further questions or concerns regarding therapy or mobility.     Follow Up Recommendations  Outpatient PT     Equipment Recommendations  Rolling walker with 5" wheels;3in1 (PT)    Recommendations for Other Services       Precautions / Restrictions Precautions Precautions: Fall;Anterior Hip Restrictions Weight Bearing Restrictions: Yes RLE Weight Bearing: Weight bearing as tolerated    Mobility  Bed Mobility Overal bed mobility: Needs Assistance Bed Mobility: Supine to Sit;Sit to Supine     Supine to sit: Modified independent (Device/Increase time) Sit to supine: Min assist   General bed mobility comments: deferred, up in recliner  Transfers Overall transfer level: Needs assistance Equipment used: Rolling walker (2 wheeled) Transfers: Sit to/from Stand Sit to Stand: Supervision         General transfer comment: deferred, pt reporting feeling "loopy" as medicine was "kicking in", so mobility deferred  Ambulation/Gait Ambulation/Gait assistance: Supervision;Min guard Gait Distance (Feet): 200 Feet Assistive device: Rolling walker (2 wheeled) Gait Pattern/deviations: Step-to pattern   Gait velocity  interpretation: 1.31 - 2.62 ft/sec, indicative of limited community ambulator     Stairs Stairs: Yes Stairs assistance: Min guard Stair Management: One rail Left Number of Stairs: 4     Wheelchair Mobility    Modified Rankin (Stroke Patients Only)       Balance Overall balance assessment: Needs assistance Sitting-balance support: No upper extremity supported;Feet supported Sitting balance-Leahy Scale: Normal     Standing balance support: Bilateral upper extremity supported Standing balance-Leahy Scale: Fair Standing balance comment: reliant on walker                            Cognition Arousal/Alertness: Awake/alert Behavior During Therapy: WFL for tasks assessed/performed Overall Cognitive Status: Within Functional Limits for tasks assessed                                 General Comments: continues to feel "groggy" but clearing towards end of session.      Exercises Other Exercises Other Exercises: Pt instructed in falls prevention strategies  Other Exercises: Pt encouraged to speak with MD and therapist for guidance on when he could return to driving and his typical exercise/activity    General Comments        Pertinent Vitals/Pain Pain Assessment: 0-10 Pain Score: 4  Pain Location: R hip Pain Descriptors / Indicators: Aching;Operative site guarding Pain Intervention(s): Limited activity within patient's tolerance;Ice applied    Home Living Family/patient expects to be discharged to:: Private residence Living Arrangements: Spouse/significant other Available Help  at Discharge: Family;Available 24 hours/day Type of Home: House Home Access: Stairs to enter Entrance Stairs-Rails: Left Home Layout: One level Home Equipment: Cane - single point      Prior Function Level of Independence: Independent with assistive device(s)      Comments: Indep with ADLs, household and community mobilization; recent use of SPC due to worsening  R hip pain. Denies fall history. Very active typically.   PT Goals (current goals can now be found in the care plan section) Acute Rehab PT Goals Patient Stated Goal: to return home Progress towards PT goals: Progressing toward goals    Frequency    BID      PT Plan Current plan remains appropriate    Co-evaluation              AM-PAC PT "6 Clicks" Daily Activity  Outcome Measure  Difficulty turning over in bed (including adjusting bedclothes, sheets and blankets)?: None Difficulty moving from lying on back to sitting on the side of the bed? : None Difficulty sitting down on and standing up from a chair with arms (e.g., wheelchair, bedside commode, etc,.)?: None Help needed moving to and from a bed to chair (including a wheelchair)?: None Help needed walking in hospital room?: A Little Help needed climbing 3-5 steps with a railing? : A Little 6 Click Score: 22    End of Session Equipment Utilized During Treatment: Gait belt Activity Tolerance: Patient tolerated treatment well Patient left: in bed;with bed alarm set;with call bell/phone within reach Nurse Communication: Mobility status       Time: 1093-2355 PT Time Calculation (min) (ACUTE ONLY): 24 min  Charges:  $Gait Training: 8-22 mins $Therapeutic Exercise: 8-22 mins                     Chesley Noon, PTA 04/03/18, 10:41 AM

## 2018-04-03 NOTE — Care Management Note (Signed)
Case Management Note  Patient Details  Name: Scott Spence MRN: [id number] Date of Birth: 12/31/1950  Subjective/Objective:  Met with patient and his wife at bedside to discuss discharge planning. He will need a walker and bedside commode. Ordered from [place] with [facility]. Provided a list of home care providers. Referral to [facility] for HHPT. Home on [facility].                    Action/Plan: [facility] for HHPT.   Expected Discharge Date:  04/03/18               Expected Discharge Plan:     In-House Referral:     Discharge planning Services  CM Consult  Post Acute Care Choice:  Durable Medical Equipment, Home Health Choice offered to:  Patient, Spouse  DME Arranged:  Walker rolling, Bedside commode DME Agency:  [place]:  PT [place]:  [facility] ([facility])  Status of Service:  Completed, signed off  If discussed at [employer], dates discussed:    Additional Comments:  Jolly Mango, RN 04/03/2018, 11:59 AM

## 2018-08-18 ENCOUNTER — Other Ambulatory Visit: Payer: Self-pay | Admitting: Student

## 2018-08-18 ENCOUNTER — Other Ambulatory Visit (HOSPITAL_COMMUNITY): Payer: Self-pay | Admitting: Student

## 2018-08-18 DIAGNOSIS — R1011 Right upper quadrant pain: Secondary | ICD-10-CM

## 2018-08-18 DIAGNOSIS — R1012 Left upper quadrant pain: Principal | ICD-10-CM

## 2018-08-20 ENCOUNTER — Ambulatory Visit
Admission: RE | Admit: 2018-08-20 | Discharge: 2018-08-20 | Disposition: A | Discharge status: Home / Self Care | Payer: Medicare Other | Source: Ambulatory Visit | Attending: Student | Admitting: Student

## 2018-08-20 DIAGNOSIS — R1012 Left upper quadrant pain: Secondary | ICD-10-CM | POA: Diagnosis present

## 2018-08-20 DIAGNOSIS — R1011 Right upper quadrant pain: Secondary | ICD-10-CM | POA: Insufficient documentation

## 2019-03-19 ENCOUNTER — Other Ambulatory Visit: Payer: Medicare Other

## 2019-03-19 ENCOUNTER — Other Ambulatory Visit: Payer: Self-pay

## 2019-03-19 DIAGNOSIS — R972 Elevated prostate specific antigen [PSA]: Secondary | ICD-10-CM

## 2019-03-20 LAB — PSA, TOTAL AND FREE
PSA, Free Pct: 15.7 %
PSA, Free: 1.02 ng/mL
Prostate Specific Ag, Serum: 6.5 ng/mL — ABNORMAL HIGH (ref 0.0–4.0)

## 2019-03-23 ENCOUNTER — Ambulatory Visit (INDEPENDENT_AMBULATORY_CARE_PROVIDER_SITE_OTHER): Payer: Medicare Other | Admitting: Urology

## 2019-03-23 ENCOUNTER — Other Ambulatory Visit: Payer: Self-pay

## 2019-03-23 VITALS — BP 157/97 | HR 76 | Ht 67.0 in | Wt 195.0 lb

## 2019-03-23 DIAGNOSIS — R972 Elevated prostate specific antigen [PSA]: Secondary | ICD-10-CM

## 2019-03-23 DIAGNOSIS — N401 Enlarged prostate with lower urinary tract symptoms: Secondary | ICD-10-CM | POA: Diagnosis not present

## 2019-03-23 DIAGNOSIS — N138 Other obstructive and reflux uropathy: Secondary | ICD-10-CM

## 2019-03-23 NOTE — Progress Notes (Signed)
   03/23/2019 4:16 PM   Scott Spence 1950-08-09 [id number]  Reason for visit: Follow up elevated PSA  HPI: I saw Scott Spence back in urology clinic for follow-up of elevated PSA.  Briefly, he is a healthy 68 year old male with a history of persistently elevated PSA between 5 -7 over the last 2 years.  He underwent a prostate biopsy in August 2018 for an elevated PSA of 6.6.  This showed a 50 g prostate with inflammation, but no malignancy.  There is no family history of prostate cancer.  PSA today is stable at 6.5(15.7% free).  His urinary symptoms are well controlled on Flomax.   ROS: Please see flowsheet from today's date for complete review of systems.  Physical Exam: BP (!) 157/97 (BP Location: Left Arm, Patient Position: Sitting, Cuff Size: Normal)   Pulse 76   Ht 5\' 7"  (1.702 m)   Wt 195 lb (88.5 kg)   BMI 30.54 kg/m    Constitutional:  Alert and oriented, No acute distress. Respiratory: Normal respiratory effort, no increased work of breathing. GI: Abdomen is soft, nontender, nondistended, no abdominal masses DRE: 50 g, smooth, no nodules Skin: No rashes, bruises or suspicious lesions. Neurologic: Grossly intact, no focal deficits, moving all 4 extremities. Psychiatric: Normal mood and affect  Laboratory Data: PSA history  02/2019: 6.5(15.7% free) 02/2018: 5.87 08/2017: 6.8 12/2016: 6.16 09/2016: 6.67  Assessment & Plan:   In summary, the patient is a 68 year old healthy male with no family history of prostate cancer here for ongoing monitoring of a mildly elevated and stable PSA.  DRE is benign.  He underwent a negative prostate biopsy in 2018 for PSA of 6.6, and PSA remained stable over the last 2 years at ~6.5.  We discussed the AUA guidelines around PSA screening, and recommending discontinuing screening after age 31.  We also discussed the very low, but not 0, risk of missing a clinically significant prostate cancer by deferring prostate MRI or prostate biopsy at  this time.  His PSA trend and stability over the last 2 years is certainly very reassuring, and ongoing PSA surveillance is very reasonable.  RTC 1 year for repeat PSA Consider discontinuing screening around age 24 if stable  A total of 15 minutes were spent face-to-face with the patient, greater than 50% was spent in patient education, counseling, and coordination of care regarding PSA screening and BPH.  Billey Co, [facility] [address] [place], [place] [address] [phone]

## 2019-03-23 NOTE — Patient Instructions (Signed)
Prostate Cancer Screening  The prostate is a walnut-sized gland that is located below the bladder and in front of the rectum in males. The function of the prostate (prostate gland) is to add fluid to semen during ejaculation. Prostate cancer is the second most common type of cancer in men. A screening test for cancer is a test that is done before cancer symptoms start. Screening can help to identify cancer at an early stage, when the cancer can be treated more easily. The recommended prostate cancer screening test is a blood test called the prostate-specific antigen (PSA) test. PSA is a protein that is made in the prostate. As you age, your prostate naturally produces more PSA. Abnormally high PSA levels may be caused by:  Prostate cancer.  An enlarged prostate that is not caused by cancer (benign prostatic hyperplasia, BPH). This condition is very common in older men.  A prostate gland infection (prostatitis).  Medicines to assist with hair growth, such as finasteride. Depending on the PSA results, you may need more tests, such as:  A physical exam to check the size of your prostate gland.  Blood and imaging tests.  A procedure to remove tissue samples from your prostate gland for testing (biopsy). Who should have screening? Screening recommendations vary based on age.  If you are younger than age 40, screening is not recommended.  If you are age 40-54 and you have no risk factors, screening is not recommended.  If you are younger than age 55, ask your health care provider if you need screening if you have one of these risk factors: ? Being of African-American descent. ? Having a family history of prostate cancer.  If you are age 55-69, talk with your health care provider about your need for screening and how often screening should be done.  If you are older than age 70, screening is not recommended. This is because the risks that screening can cause are greater than the benefits  that it may provide (risks outweigh the benefits). If you are at high risk for prostate cancer, your health care provider may recommend that you have screenings more often or start screening at a younger age. You may be at high risk if you:  Are older than age 55.  Are African-American.  Have a father, brother, or uncle who has been diagnosed with prostate cancer. The risk may be higher if your family member's cancer occurred at an early age. What are the benefits of screening? There is a small chance that screening may lower your risk of dying from prostate cancer. The chance is small because prostate cancer is typically a slow-growing cancer, and most men with prostate cancer die from a different cause. What are the risks of screening? The main risk of prostate cancer screening is diagnosing and treating prostate cancer that would never have caused any symptoms or problems (overdiagnosis and overtreatment). PSA screening cannot tell you if your PSA is high due to cancer or a different cause. A prostate biopsy is the only procedure to diagnose prostate cancer. Even the results of a biopsy may not tell you if your cancer needs to be treated. Slow-growing prostate cancer may not need any treatment other than monitoring, so diagnosing and treating it may cause unnecessary stress or other side effects. A prostate biopsy may also cause:  Infection or fever.  A false negative. This is a result that shows that you do not have prostate cancer when you actually do have prostate cancer. Questions   to ask your health care provider  When should I start prostate cancer screening?  What is my risk for prostate cancer?  How often do I need screening?  What type of screening tests do I need?  How do I get my test results?  What do my results mean?  Do I need treatment? Contact a health care provider if:  You have difficulty urinating.  You have pain when you urinate or ejaculate.  You have  blood in your urine or semen.  You have pain in your back or in the area of your prostate.  You have trouble getting or maintaining an erection (erectile dysfunction, ED). Summary  Prostate cancer is a common type of cancer in men. The prostate (prostate gland) is located below the bladder and in front of the rectum. This gland adds fluid to semen during ejaculation.  Prostate cancer screening may identify cancer at an early stage, when the cancer can be treated more easily.  The prostate-specific antigen (PSA) test is the recommended screening test for prostate cancer.  Discuss the risks and benefits of prostate cancer screening with your health care provider. If you are age 57 or older, screening is likely to lead to more risks than benefits (risks outweigh the benefits). This information is not intended to replace advice given to you by your health care provider. Make sure you discuss any questions you have with your health care provider. Document Released: 04/19/2017 Document Revised: 06/21/2017 Document Reviewed: 04/19/2017 Elsevier Patient Education  2020 [employer] Antigen Test Why am I having this test? The prostate-specific antigen (PSA) test is a screening test for prostate cancer. It can identify early signs of prostate cancer, which may allow for more effective treatment. Your health care provider may recommend that you have a PSA test starting at age 61 or that you have one earlier or later, depending on your risk factors for prostate cancer. You may also have a PSA test:  To monitor treatment of prostate cancer.  To check whether prostate cancer has returned after treatment.  If you have signs of other conditions that can affect PSA levels, such as: ? An enlarged prostate that is not caused by cancer (benign prostatic hyperplasia, BPH). This condition is very common in older men. ? A prostate infection. What is being tested? This test measures the  amount of PSA in your blood. PSA is a protein that is made in the prostate. The prostate naturally produces more PSA as you age, but very high levels may be a sign of a medical condition. What kind of sample is taken?  A blood sample is required for this test. It is usually collected by inserting a needle into a blood vessel or by sticking a finger with a small needle. Blood for this test should be drawn before having an exam of the prostate. How do I prepare for this test? Do not ejaculate starting 24 hours before your test, or as long as told by your health care provider. Tell a health care provider about:  Any allergies you have.  All medicines you are taking, including vitamins, herbs, eye drops, creams, and over-the-counter medicines. This also includes: ? Medicines to assist with hair growth, such as finasteride. ? Any recent exposure to a medicine called diethylstilbestrol.  Any blood disorders you have.  Any recent procedures you have had, especially any procedures involving the prostate or rectum.  Any medical conditions you have.  Any recent urinary tract infections (  UTIs) you have had. How are the results reported? Your test results will be reported as a value that indicates how much PSA is in your blood. This will be given as nanograms of PSA per milliliter of blood (ng/mL). Your health care provider will compare your results to normal ranges that were established after testing a large group of people (reference ranges). Reference ranges may vary among labs and hospitals. PSA levels vary from person to person and generally increase with age. Because of this variation, there is no single PSA value that is considered normal for everyone. Instead, PSA reference ranges are used to describe whether your PSA levels are considered low or high (elevated). Common reference ranges are:  Low: 0-2.5 ng/mL.  Slightly to moderately elevated: 2.6-10.0 ng/mL.  Moderately elevated: 10.0-19.9  ng/mL.  Significantly elevated: 20 ng/mL or greater. Sometimes, the test results may report that a condition is present when it is not present (false-positive result). What do the results mean? A test result that is higher than 4 ng/mL may mean that you are at an increased risk for prostate cancer. However, a PSA test by itself is not enough to diagnose prostate cancer. High PSA levels may also be caused by the natural aging process, prostate infection, or BPH. PSA screening cannot tell you if your PSA is high due to cancer or a different cause. A prostate biopsy is the only way to diagnose prostate cancer. A risk of having the PSA test is diagnosing and treating prostate cancer that would never have caused any symptoms or problems (overdiagnosis and overtreatment). Talk with your health care provider about what your results mean. Questions to ask your health care provider Ask your health care provider, or the department that is doing the test:  When will my results be ready?  How will I get my results?  What are my treatment options?  What other tests do I need?  What are my next steps? Summary  The prostate-specific antigen (PSA) test is a screening test for prostate cancer.  Your health care provider may recommend that you have a PSA test starting at age 14 or that you have one earlier or later, depending on your risk factors for prostate cancer.  A test result that is higher than 4 ng/mL may mean that you are at an increased risk for prostate cancer. However, elevated levels can be caused by a number of conditions other than prostate cancer.  Talk with your health care provider about what your results mean. This information is not intended to replace advice given to you by your health care provider. Make sure you discuss any questions you have with your health care provider. Document Released: 08/11/2004 Document Revised: 06/21/2017 Document Reviewed: 04/15/2017 Elsevier Patient  Education  2020 [employer].

## 2019-03-24 ENCOUNTER — Ambulatory Visit: Payer: Medicare Other | Admitting: Urology

## 2019-09-06 ENCOUNTER — Ambulatory Visit: Payer: Medicare Other | Attending: Internal Medicine

## 2019-09-06 DIAGNOSIS — Z23 Encounter for immunization: Secondary | ICD-10-CM

## 2019-09-06 NOTE — Progress Notes (Signed)
   Covid-19 Vaccination Clinic  Name:  Scott Spence    MRN: [id number] DOB: 11-07-1950  09/06/2019  [name] was observed post Covid-19 immunization for 15 minutes without incidence. He was provided with Vaccine Information Sheet and instruction to access the V-Safe system.   [name] was instructed to call 911 with any severe reactions post vaccine: Marland Kitchen Difficulty breathing  . Swelling of your face and throat  . A fast heartbeat  . A bad rash all over your body  . Dizziness and weakness    Immunizations Administered    Name Date Dose VIS Date Route   Pfizer COVID-19 Vaccine 09/06/2019  8:52 AM 0.3 mL 07/03/2019 Intramuscular   Manufacturer: [place]   Lot: [id number]   [place]: [id number]

## 2019-09-30 ENCOUNTER — Ambulatory Visit: Payer: Medicare Other | Attending: Internal Medicine

## 2019-09-30 DIAGNOSIS — Z23 Encounter for immunization: Secondary | ICD-10-CM

## 2019-09-30 NOTE — Progress Notes (Signed)
   Covid-19 Vaccination Clinic  Name:  Scott Spence    MRN: [id number] DOB: 08/18/1950  09/30/2019  [name] was observed post Covid-19 immunization for 15 minutes without incident. He was provided with Vaccine Information Sheet and instruction to access the V-Safe system.   [name] was instructed to call 911 with any severe reactions post vaccine: Marland Kitchen Difficulty breathing  . Swelling of face and throat  . A fast heartbeat  . A bad rash all over body  . Dizziness and weakness   Immunizations Administered    Name Date Dose VIS Date Route   Pfizer COVID-19 Vaccine 09/30/2019  4:20 PM 0.3 mL 07/03/2019 Intramuscular   Manufacturer: [place]   Lot: [id number]   [place]: [id number]

## 2019-12-09 ENCOUNTER — Ambulatory Visit: Payer: Medicare Other | Admitting: Podiatry

## 2019-12-23 ENCOUNTER — Other Ambulatory Visit: Payer: Self-pay | Admitting: Orthopedic Surgery

## 2019-12-23 DIAGNOSIS — M47816 Spondylosis without myelopathy or radiculopathy, lumbar region: Secondary | ICD-10-CM

## 2019-12-23 DIAGNOSIS — M5441 Lumbago with sciatica, right side: Secondary | ICD-10-CM

## 2019-12-28 ENCOUNTER — Encounter: Payer: Self-pay | Admitting: Podiatry

## 2019-12-28 ENCOUNTER — Other Ambulatory Visit: Payer: Self-pay | Admitting: Podiatry

## 2019-12-28 ENCOUNTER — Ambulatory Visit (INDEPENDENT_AMBULATORY_CARE_PROVIDER_SITE_OTHER): Payer: Medicare Other

## 2019-12-28 ENCOUNTER — Ambulatory Visit (INDEPENDENT_AMBULATORY_CARE_PROVIDER_SITE_OTHER): Payer: Medicare Other | Admitting: Podiatry

## 2019-12-28 ENCOUNTER — Other Ambulatory Visit: Payer: Self-pay

## 2019-12-28 DIAGNOSIS — Q828 Other specified congenital malformations of skin: Secondary | ICD-10-CM | POA: Diagnosis not present

## 2019-12-28 DIAGNOSIS — M5416 Radiculopathy, lumbar region: Secondary | ICD-10-CM | POA: Diagnosis not present

## 2019-12-28 DIAGNOSIS — M2041 Other hammer toe(s) (acquired), right foot: Secondary | ICD-10-CM

## 2019-12-28 DIAGNOSIS — M2042 Other hammer toe(s) (acquired), left foot: Secondary | ICD-10-CM

## 2019-12-28 DIAGNOSIS — M778 Other enthesopathies, not elsewhere classified: Secondary | ICD-10-CM

## 2019-12-28 DIAGNOSIS — G5793 Unspecified mononeuropathy of bilateral lower limbs: Secondary | ICD-10-CM

## 2019-12-28 NOTE — Progress Notes (Signed)
Subjective:  Patient ID: Scott Spence, male    DOB: 03/15/1951,  MRN: [id number] HPI Chief Complaint  Patient presents with   Toe Pain    2nd and 3rd toes bilateral - feels like sensations lost in toes, can't move them well x 4 years, worsened over time, using padding   Foot Pain    Lateral foot right - tender and callused when walking   Toe Pain    Hallux bilateral (interdigital) - callused areas    New Patient (Initial Visit)    69 y.o. male presents with the above complaint.   ROS: Denies fever chills nausea vomiting muscle aches pains calf pain back pain chest pain shortness of breath.  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 , Disp: , Rfl:    aspirin EC 325 MG EC tablet, Take 1 tablet (325 mg total) by mouth daily with breakfast., Disp: 30 tablet, Rfl: 0   calcium gluconate 500 MG tablet, Take 1 tablet by mouth daily., Disp: , Rfl:    Coenzyme Q-10 200 MG CAPS, Take 200 mg by mouth daily. , Disp: , Rfl:    Cyanocobalamin (VITAMIN B 12 PO), Take 3,000 mcg by mouth daily. , Disp: , Rfl:    Ergocalciferol (VITAMIN D2) 2000 units TABS, Take 2,000 Units by mouth daily., Disp: , Rfl:    Glucosamine-Chondroit-Vit C-Mn (GLUCOSAMINE 1500 COMPLEX PO), Take  1,500 mg by mouth daily., Disp: , Rfl:    losartan (COZAAR) 100 MG tablet, Take 100 mg by mouth daily. , Disp: , Rfl:    Magnesium 250 MG TABS, Take 250 mg by mouth daily., Disp: , Rfl:    mometasone (NASONEX) 50 MCG/ACT nasal spray, Place 1 spray into the nose 2 (two) times daily. , Disp: , Rfl:    Multiple Vitamin (MULTIVITAMIN) capsule, Take 1 capsule by mouth daily. , Disp: , Rfl:    Omega-3 Fatty Acids (FISH OIL) 1000 MG CAPS, Take 1,000 mg by mouth daily. , Disp: , Rfl:    omeprazole (PRILOSEC) 20 MG capsule, Take 20 mg by mouth daily. , Disp: , Rfl:    RESTASIS 0.05 % ophthalmic emulsion, Place 2 drops into both eyes 2 (two) times daily. , Disp: , Rfl:    rosuvastatin (CRESTOR) 5 MG tablet, Take 5 mg by mouth daily., Disp: , Rfl:    tamsulosin (FLOMAX) 0.4 MG CAPS capsule, Take 0.8 mg by mouth daily. , Disp: , Rfl:   Allergies  Allergen Reactions   Meperidine Other (See Comments)    GI Upset   Sulfa Antibiotics Hives   Gabapentin Other (See Comments)    Migraine HA   Lisinopril Cough   Review of Systems Objective:  There were  no vitals filed for this visit.  General: Well developed, nourished, in no acute distress, alert and oriented x3   Dermatological: Skin is warm, dry and supple bilateral. Nails x 10 are well maintained; remaining integument appears unremarkable at this time. There are no open sores, no preulcerative lesions, no rash or signs of infection present.  Vascular: Dorsalis Pedis artery and Posterior Tibial artery pedal pulses are 2/4 bilateral with immedate capillary fill time. Pedal hair growth present. No varicosities and no lower extremity edema present bilateral.   Neruologic: Grossly intact via light touch bilateral. Vibratory intact via tuning fork bilateral. Protective threshold with Semmes Wienstein monofilament absent to the majority of the nerve distributions with exception of the saphenous nerve in the sural nerve.  Patellar and Achilles  deep tendon reflexes 2+ bilateral. No Babinski or clonus noted bilateral.   Musculoskeletal: No gross boney pedal deformities bilateral. No pain, crepitus, or limitation noted with foot and ankle range of motion bilateral. Muscular strength 2 out of 5 peroneal right.  And 4 out of 5 to the remaining dorsiflexors plantar flexors inverters and everters.  In all groups tested bilateral.  Considerable muscle wasting with loss of tone to toes #2 #3 bilaterally they were very flail.  He is only able to dorsiflex the hallux fourth and fifth toes bilaterally.  Gait: Unassisted, Nonantalgic.    Radiographs:  Radiographs taken today demonstrate no significant out abnormalities of the foot.  No acute findings.  Osseously mature individual rectus foot type.  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  Most likely related to back or some other type of neurological issue currently being worked up for spinal issues by orthopedics.  I recommended that he consider having his back fixed in hopes that he may regain some of his motor control and sensory status.  Plan: Follow-up with him once orthopedics or neurosurgery have done all they can do.  May need to get him into an AFO for the right foot as the inverters overpower the everters.     Mounir Skipper T. [place], [place]

## 2020-01-05 ENCOUNTER — Other Ambulatory Visit: Payer: Self-pay

## 2020-01-05 ENCOUNTER — Ambulatory Visit
Admission: RE | Admit: 2020-01-05 | Discharge: 2020-01-05 | Disposition: A | Discharge status: Home / Self Care | Payer: Medicare Other | Source: Ambulatory Visit | Attending: Orthopedic Surgery | Admitting: Orthopedic Surgery

## 2020-01-05 DIAGNOSIS — M5441 Lumbago with sciatica, right side: Secondary | ICD-10-CM | POA: Diagnosis present

## 2020-01-05 DIAGNOSIS — M5442 Lumbago with sciatica, left side: Secondary | ICD-10-CM

## 2020-01-05 DIAGNOSIS — M47816 Spondylosis without myelopathy or radiculopathy, lumbar region: Secondary | ICD-10-CM

## 2020-03-02 ENCOUNTER — Other Ambulatory Visit: Payer: Self-pay

## 2020-03-02 ENCOUNTER — Encounter: Payer: Self-pay | Admitting: Podiatry

## 2020-03-02 ENCOUNTER — Ambulatory Visit (INDEPENDENT_AMBULATORY_CARE_PROVIDER_SITE_OTHER): Payer: Medicare Other | Admitting: Podiatry

## 2020-03-02 DIAGNOSIS — M7751 Other enthesopathy of right foot: Secondary | ICD-10-CM

## 2020-03-02 DIAGNOSIS — Q828 Other specified congenital malformations of skin: Secondary | ICD-10-CM | POA: Diagnosis not present

## 2020-03-02 NOTE — Progress Notes (Signed)
He presents today for chief complaint of pain to his fifth metatarsal head right foot.  He also presents today very grateful that we send him to Dr. Saintclair Halsted for his poor gait and his drop toes #2 #3 bilaterally.  He had to have back surgery which relieved pain in his leg and has allowed for his gait to return to normal that he still has dropped toes.  Objective: Vital signs are stable he is alert oriented x3 pulses are palpable.  Muscle strength on extension is weak particularly the digital however the extensor hallucis longus is strong and the tibialis anterior strong.  He has cavus foot deformities bilateral.  Subfifth metatarsal head of the right foot results in a poor keratoma this lesion and a bursitis with mild erythema.  No open lesions or wounds.  Assessment: Capsulitis bursitis subfifth met head right.  Porokeratosis subfifth met head right.  Plan: Injected bursitis today with 10 mg of dexamethasone local anesthetic also debrided the reactive hyperkeratotic lesion showed him different padding tricks to try to help offload that area.

## 2020-03-21 ENCOUNTER — Other Ambulatory Visit: Payer: Medicare Other

## 2020-03-21 ENCOUNTER — Other Ambulatory Visit: Payer: Self-pay

## 2020-03-21 DIAGNOSIS — R972 Elevated prostate specific antigen [PSA]: Secondary | ICD-10-CM

## 2020-03-21 DIAGNOSIS — N138 Other obstructive and reflux uropathy: Secondary | ICD-10-CM

## 2020-03-21 DIAGNOSIS — N401 Enlarged prostate with lower urinary tract symptoms: Secondary | ICD-10-CM

## 2020-03-22 LAB — FPSA% REFLEX
% FREE PSA: 18.5 %
PSA, FREE: 1.65 ng/mL

## 2020-03-22 LAB — PSA TOTAL (REFLEX TO FREE): Prostate Specific Ag, Serum: 8.9 ng/mL — ABNORMAL HIGH (ref 0.0–4.0)

## 2020-03-24 ENCOUNTER — Other Ambulatory Visit: Payer: Self-pay

## 2020-03-24 ENCOUNTER — Ambulatory Visit (INDEPENDENT_AMBULATORY_CARE_PROVIDER_SITE_OTHER): Payer: Medicare Other | Admitting: Urology

## 2020-03-24 ENCOUNTER — Encounter: Payer: Self-pay | Admitting: Urology

## 2020-03-24 VITALS — BP 150/96 | HR 87 | Ht 67.0 in | Wt 180.0 lb

## 2020-03-24 DIAGNOSIS — R972 Elevated prostate specific antigen [PSA]: Secondary | ICD-10-CM

## 2020-03-24 DIAGNOSIS — N138 Other obstructive and reflux uropathy: Secondary | ICD-10-CM | POA: Diagnosis not present

## 2020-03-24 DIAGNOSIS — Z125 Encounter for screening for malignant neoplasm of prostate: Secondary | ICD-10-CM

## 2020-03-24 DIAGNOSIS — N401 Enlarged prostate with lower urinary tract symptoms: Secondary | ICD-10-CM

## 2020-03-24 MED ORDER — TAMSULOSIN HCL 0.4 MG PO CAPS: 0.8000 mg | ORAL_CAPSULE | Freq: Every day | ORAL | 3 refills | Status: DC

## 2020-03-24 NOTE — Progress Notes (Signed)
° °  03/24/2020 4:24 PM   Scott Spence Feb 08, 1951 [id number]  Reason for visit: Follow up elevated PSA, BPH  HPI: I saw Scott Spence in urology clinic for follow-up of the above issues.  Briefly, he is a relatively healthy 69 year old male with a long history of persistently elevated PSA from 5-7 over the last few years.  He underwent a prostate biopsy in August 2018 with Dr. Pilar Jarvis for an elevated PSA of 6.6, which showed a 50 g prostate with inflammation but no malignancy.  He denies a family history of prostate cancer.  PSA increased this year to 8.9(18.5% free) from 6.5 last year.  DRE today shows a 60 g prostate that is smooth with a prominent sulcus, but no nodules or masses.  Regarding his BPH symptoms, he continues on Flomax 0.8 mg daily and reports his urinary symptoms are very well controlled with really no complaints at this time.  He is not having any nocturia, and is voiding with a good stream.  He underwent back surgery around the time his PSA was drawn, and we discussed that surgery,.  Catheter placement, or infection can alter the PSA accuracy.  We reviewed the implications of an elevated PSA and the uncertainty surrounding it. In general, a man's PSA increases with age and is produced by both normal and cancerous prostate tissue. The differential diagnosis for elevated PSA includes BPH, prostate cancer, infection, recent intercourse/ejaculation, recent urethroscopic manipulation (foley placement/cystoscopy) or trauma, and prostatitis.   Management of an elevated PSA can include observation or prostate biopsy and we discussed this in detail. Our goal is to detect clinically significant prostate cancers, and manage with either active surveillance, surgery, or radiation for localized disease. Risks of prostate biopsy include bleeding, infection (including life threatening sepsis), pain, and lower urinary symptoms. Hematuria, hematospermia, and blood in the stool are all common after  biopsy and can persist up to 4 weeks.   We discussed other options including prostate MRI or 4K score.  RTC in 2 to 3 months with 4K score prior Flomax refilled  Billey Co, MD  [facility] [address], [place], [place] [address] [phone]

## 2020-03-24 NOTE — Patient Instructions (Signed)
Prostate Cancer Screening  Prostate cancer screening is a test that is done to check for the presence of prostate cancer in men. The prostate gland is a walnut-sized gland that is located below the bladder and in front of the rectum in males. The function of the prostate is to add fluid to semen during ejaculation. Prostate cancer is the second most common type of cancer in men. Who should have prostate cancer screening?  Screening recommendations vary based on age and other risk factors. Screening is recommended if:  You are older than age 55. If you are age 55-69, talk with your health care provider about your need for screening and how often screening should be done. Because most prostate cancers are slow growing and will not cause death, screening is generally reserved in this age group for men who have a 10-15-year life expectancy.  You are younger than age 55, and you have these risk factors: ? Being a black male or a male of African descent. ? Having a father, brother, or uncle who has been diagnosed with prostate cancer. The risk is higher if your family member's cancer occurred at an early age. Screening is not recommended if:  You are younger than age 40.  You are between the ages of 40 and 54 and you have no risk factors.  You are 70 years of age or older. At this age, the risks that screening can cause are greater than the benefits that it may provide. If you are at high risk for prostate cancer, your health care provider may recommend that you have screenings more often or that you start screening at a younger age. How is screening for prostate cancer done? The recommended prostate cancer screening test is a blood test called the prostate-specific antigen (PSA) test. PSA is a protein that is made in the prostate. As you age, your prostate naturally produces more PSA. Abnormally high PSA levels may be caused by:  Prostate cancer.  An enlarged prostate that is not caused by cancer  (benign prostatic hyperplasia, BPH). This condition is very common in older men.  A prostate gland infection (prostatitis). Depending on the PSA results, you may need more tests, such as:  A physical exam to check the size of your prostate gland.  Blood and imaging tests.  A procedure to remove tissue samples from your prostate gland for testing (biopsy). What are the benefits of prostate cancer screening?  Screening can help to identify cancer at an early stage, before symptoms start and when the cancer can be treated more easily.  There is a small chance that screening may lower your risk of dying from prostate cancer. The chance is small because prostate cancer is a slow-growing cancer, and most men with prostate cancer die from a different cause. What are the risks of prostate cancer screening? The main risk of prostate cancer screening is diagnosing and treating prostate cancer that would never have caused any symptoms or problems. This is called overdiagnosisand overtreatment. PSA screening cannot tell you if your PSA is high due to cancer or a different cause. A prostate biopsy is the only procedure to diagnose prostate cancer. Even the results of a biopsy may not tell you if your cancer needs to be treated. Slow-growing prostate cancer may not need any treatment other than monitoring, so diagnosing and treating it may cause unnecessary stress or other side effects. A prostate biopsy may also cause:  Infection or fever.  A false negative. This is   a result that shows that you do not have prostate cancer when you actually do have prostate cancer. Questions to ask your health care provider  When should I start prostate cancer screening?  What is my risk for prostate cancer?  How often do I need screening?  What type of screening tests do I need?  How do I get my test results?  What do my results mean?  Do I need treatment? Where to find more information  The American Cancer  Society: www.cancer.org  American Urological Association: www.auanet.org Contact a health care provider if:  You have difficulty urinating.  You have pain when you urinate or ejaculate.  You have blood in your urine or semen.  You have pain in your back or in the area of your prostate. Summary  Prostate cancer is a common type of cancer in men. The prostate gland is located below the bladder and in front of the rectum. This gland adds fluid to semen during ejaculation.  Prostate cancer screening may identify cancer at an early stage, when the cancer can be treated more easily.  The prostate-specific antigen (PSA) test is the recommended screening test for prostate cancer.  Discuss the risks and benefits of prostate cancer screening with your health care provider. If you are age 70 or older, the risks that screening can cause are greater than the benefits that it may provide. This information is not intended to replace advice given to you by your health care provider. Make sure you discuss any questions you have with your health care provider. Document Revised: 02/19/2019 Document Reviewed: 02/19/2019 Elsevier Patient Education  2020 Elsevier Inc.  

## 2020-05-25 ENCOUNTER — Ambulatory Visit (INDEPENDENT_AMBULATORY_CARE_PROVIDER_SITE_OTHER): Payer: Medicare Other | Admitting: Dermatology

## 2020-05-25 ENCOUNTER — Other Ambulatory Visit: Payer: Self-pay

## 2020-05-25 DIAGNOSIS — R21 Rash and other nonspecific skin eruption: Secondary | ICD-10-CM

## 2020-05-25 DIAGNOSIS — L820 Inflamed seborrheic keratosis: Secondary | ICD-10-CM | POA: Diagnosis not present

## 2020-05-25 MED ORDER — TACROLIMUS 0.1 % EX OINT: TOPICAL_OINTMENT | Freq: Two times a day (BID) | CUTANEOUS | 1 refills | Status: DC

## 2020-05-25 NOTE — Progress Notes (Addendum)
   New Patient Visit  Subjective  Scott Spence is a 69 y.o. male who presents for the following: lesions (Patient c/o spot on buttocks and right elbow. Dur: more than 6 months. Has been using TAC on elbow and Lotrisone on buttock. Not helping. Lesion on buttocks is painful.  ).  Objective  Well appearing patient in no apparent distress; mood and affect are within normal limits.  Review of Systems: No other skin or systemic complaints except as noted in HPI or Assessment and Plan.  A focused examination was performed including face, buttocks, arms. Relevant physical exam findings are noted in the Assessment and Plan.  Objective  Right distal tricep x1: Erythematous keratotic or waxy stuck-on papule or plaque.   Objective  Buttocks: Impending decubitus Erythematous patches ~4cm at left buttock, ~3cm and less significant at right buttock  Assessment & Plan  Inflamed seborrheic keratosis Right distal tricep x1 D/C Triamcinolone cream.   Destruction of lesion - Right distal tricep x1 Complexity: simple   Destruction method: cryotherapy   Informed consent: discussed and consent obtained   Timeout:  patient name, date of birth, surgical site, and procedure verified Lesion destroyed using liquid nitrogen: Yes   Region frozen until ice ball extended beyond lesion: Yes   Outcome: patient tolerated procedure well with no complications   Post-procedure details: wound care instructions given    Rash - consistent with Chronic impending decubitus ulcers - exacerbated by sitting in hard chair and back problems limiting mobility recently. Buttocks Stop Clotrimazole/Betamethasone cream.  Avoid topical steroids. Start Protopic 0.1% ointment BID  Avoid pressure to area as much as possible. Walking is recommended and less sitting.  tacrolimus (PROTOPIC) 0.1 % ointment - Buttocks  Return in about 3 months (around 08/25/2020) for rash recheck.   I, Emelia Salisbury, CMA, am acting as scribe for  Sarina Ser, MD.  Documentation: I have reviewed the above documentation for accuracy and completeness, and I agree with the above.  Sarina Ser, MD

## 2020-05-25 NOTE — Patient Instructions (Signed)
Cryotherapy Aftercare  . Wash gently with soap and water everyday.   . Apply Vaseline and Band-Aid daily until healed.  Prior to procedure, discussed risks of blister formation, small wound, skin dyspigmentation, or rare scar following cryotherapy.   

## 2020-05-26 ENCOUNTER — Encounter: Payer: Self-pay | Admitting: Dermatology

## 2020-05-26 NOTE — Addendum Note (Signed)
Addended by: Ralene Bathe on: 05/26/2020 05:58 PM   Modules accepted: Level of Service

## 2020-05-30 ENCOUNTER — Ambulatory Visit (INDEPENDENT_AMBULATORY_CARE_PROVIDER_SITE_OTHER): Payer: Medicare Other | Admitting: Family Medicine

## 2020-05-30 ENCOUNTER — Other Ambulatory Visit: Payer: Self-pay

## 2020-05-30 DIAGNOSIS — R972 Elevated prostate specific antigen [PSA]: Secondary | ICD-10-CM

## 2020-05-30 NOTE — Progress Notes (Signed)
4KSCORE  Due to elevated PSA level patient presents today for a 4KSCORE  Blood was drawn from left antecubital fossa   using a 23guage needle and syringe to deposit blood into a SST tube  Patient tolerated well, no complications were noted  Blood collected today was spun and labeled for packaging. Requisition form was filled out and signed by provider and patient.  [employer] was contacted for pick up of specimen.   Preformed by: Elberta Leatherwood, CMA   If Patient has medicare make sure they are aware, Medicare is only covering the 4K draw if it meets this criteria:  Elevated PSA  DRE no nodules  Pervious Negative Biopsy

## 2020-05-31 ENCOUNTER — Other Ambulatory Visit: Payer: Self-pay

## 2020-05-31 DIAGNOSIS — R21 Rash and other nonspecific skin eruption: Secondary | ICD-10-CM

## 2020-05-31 MED ORDER — TACROLIMUS 0.1 % EX OINT: TOPICAL_OINTMENT | Freq: Two times a day (BID) | CUTANEOUS | 1 refills | Status: DC

## 2020-05-31 NOTE — Progress Notes (Signed)
Tacrolimus sent to [facility] for cheaper pricing for patient.

## 2020-06-01 ENCOUNTER — Other Ambulatory Visit: Payer: Self-pay | Admitting: Urology

## 2020-06-02 ENCOUNTER — Other Ambulatory Visit: Payer: Self-pay | Admitting: Urology

## 2020-06-29 ENCOUNTER — Other Ambulatory Visit: Payer: Self-pay

## 2020-06-29 ENCOUNTER — Encounter: Payer: Self-pay | Admitting: Urology

## 2020-06-29 ENCOUNTER — Ambulatory Visit (INDEPENDENT_AMBULATORY_CARE_PROVIDER_SITE_OTHER): Payer: Medicare Other | Admitting: Urology

## 2020-06-29 VITALS — BP 139/100 | HR 75 | Ht 67.0 in | Wt 189.0 lb

## 2020-06-29 DIAGNOSIS — R972 Elevated prostate specific antigen [PSA]: Secondary | ICD-10-CM

## 2020-06-29 DIAGNOSIS — N401 Enlarged prostate with lower urinary tract symptoms: Secondary | ICD-10-CM

## 2020-06-29 DIAGNOSIS — N138 Other obstructive and reflux uropathy: Secondary | ICD-10-CM

## 2020-06-29 NOTE — Progress Notes (Signed)
   06/29/2020 9:45 AM   [name] 09-Sep-1950 [id number]  Reason for visit: Follow up elevated PSA, 4K results, BPH  HPI: I saw Mr. Scott Spence back in clinic for history of persistently elevated PSA and new 4K score results.  Briefly, is a 69 year old male with a long history of elevated PSA ranging from 5-7 over the last few years, negative prostate biopsy in August 2018 with Dr. Pilar Jarvis for an elevated PSA of 6.6 which showed a 50 g prostate with inflammation but no malignancy.  He has no family history of prostate cancer.  He had an increase in his PSA to 8.9(19% free) at our last visit which prompted a 4K score.  4K score showed a PSA of 7.38(19% free), and 11% risk of clinically significant prostate cancer.  Regarding his BPH, symptoms continue to be well controlled on 0.8 mg daily.  We discussed options at length regarding his persistently mildly elevated PSA including repeat PSA in 3 to 6 months, prostate MRI, or repeat prostate biopsy.  I think with his only 11% risk of clinically significant prostate cancer, and the fact that the PSA decreased to 7.38, close to his baseline, from the 8.9 prior it is very reasonable to repeat the PSA in 6 months and continue close monitoring.  We discussed the low, but not 0, risk of missing a clinically significant prostate cancer by deferring prostate MRI or repeat biopsy.  He understands these risks.  RTC 6 months with PSA prior  Billey Co, MD  [facility] [address] [place], [place] [address] [phone]

## 2020-06-29 NOTE — Patient Instructions (Signed)
Prostate Cancer Screening  Prostate cancer screening is a test that is done to check for the presence of prostate cancer in men. The prostate gland is a walnut-sized gland that is located below the bladder and in front of the rectum in males. The function of the prostate is to add fluid to semen during ejaculation. Prostate cancer is the second most common type of cancer in men. Who should have prostate cancer screening?  Screening recommendations vary based on age and other risk factors. Screening is recommended if:  You are older than age 55. If you are age 55-69, talk with your health care provider about your need for screening and how often screening should be done. Because most prostate cancers are slow growing and will not cause death, screening is generally reserved in this age group for men who have a 10-15-year life expectancy.  You are younger than age 55, and you have these risk factors: ? Being a black male or a male of African descent. ? Having a father, brother, or uncle who has been diagnosed with prostate cancer. The risk is higher if your family member's cancer occurred at an early age. Screening is not recommended if:  You are younger than age 40.  You are between the ages of 40 and 54 and you have no risk factors.  You are 70 years of age or older. At this age, the risks that screening can cause are greater than the benefits that it may provide. If you are at high risk for prostate cancer, your health care provider may recommend that you have screenings more often or that you start screening at a younger age. How is screening for prostate cancer done? The recommended prostate cancer screening test is a blood test called the prostate-specific antigen (PSA) test. PSA is a protein that is made in the prostate. As you age, your prostate naturally produces more PSA. Abnormally high PSA levels may be caused by:  Prostate cancer.  An enlarged prostate that is not caused by cancer  (benign prostatic hyperplasia, BPH). This condition is very common in older men.  A prostate gland infection (prostatitis). Depending on the PSA results, you may need more tests, such as:  A physical exam to check the size of your prostate gland.  Blood and imaging tests.  A procedure to remove tissue samples from your prostate gland for testing (biopsy). What are the benefits of prostate cancer screening?  Screening can help to identify cancer at an early stage, before symptoms start and when the cancer can be treated more easily.  There is a small chance that screening may lower your risk of dying from prostate cancer. The chance is small because prostate cancer is a slow-growing cancer, and most men with prostate cancer die from a different cause. What are the risks of prostate cancer screening? The main risk of prostate cancer screening is diagnosing and treating prostate cancer that would never have caused any symptoms or problems. This is called overdiagnosisand overtreatment. PSA screening cannot tell you if your PSA is high due to cancer or a different cause. A prostate biopsy is the only procedure to diagnose prostate cancer. Even the results of a biopsy may not tell you if your cancer needs to be treated. Slow-growing prostate cancer may not need any treatment other than monitoring, so diagnosing and treating it may cause unnecessary stress or other side effects. A prostate biopsy may also cause:  Infection or fever.  A false negative. This is   a result that shows that you do not have prostate cancer when you actually do have prostate cancer. Questions to ask your health care provider  When should I start prostate cancer screening?  What is my risk for prostate cancer?  How often do I need screening?  What type of screening tests do I need?  How do I get my test results?  What do my results mean?  Do I need treatment? Where to find more information  The American Cancer  Society: www.cancer.org  American Urological Association: www.auanet.org Contact a health care provider if:  You have difficulty urinating.  You have pain when you urinate or ejaculate.  You have blood in your urine or semen.  You have pain in your back or in the area of your prostate. Summary  Prostate cancer is a common type of cancer in men. The prostate gland is located below the bladder and in front of the rectum. This gland adds fluid to semen during ejaculation.  Prostate cancer screening may identify cancer at an early stage, when the cancer can be treated more easily.  The prostate-specific antigen (PSA) test is the recommended screening test for prostate cancer.  Discuss the risks and benefits of prostate cancer screening with your health care provider. If you are age 70 or older, the risks that screening can cause are greater than the benefits that it may provide. This information is not intended to replace advice given to you by your health care provider. Make sure you discuss any questions you have with your health care provider. Document Revised: 02/19/2019 Document Reviewed: 02/19/2019 Elsevier Patient Education  2020 Elsevier Inc.  

## 2020-08-10 ENCOUNTER — Other Ambulatory Visit: Payer: Self-pay

## 2020-08-10 ENCOUNTER — Ambulatory Visit (INDEPENDENT_AMBULATORY_CARE_PROVIDER_SITE_OTHER): Payer: Medicare Other | Admitting: Podiatry

## 2020-08-10 ENCOUNTER — Encounter: Payer: Self-pay | Admitting: Podiatry

## 2020-08-10 DIAGNOSIS — M2041 Other hammer toe(s) (acquired), right foot: Secondary | ICD-10-CM | POA: Diagnosis not present

## 2020-08-10 DIAGNOSIS — D2371 Other benign neoplasm of skin of right lower limb, including hip: Secondary | ICD-10-CM

## 2020-08-10 DIAGNOSIS — M2042 Other hammer toe(s) (acquired), left foot: Secondary | ICD-10-CM

## 2020-08-10 NOTE — Progress Notes (Signed)
He presents today concerned about a callus to the plantar aspect of his right foot.  His back surgery has gone well though he still has no function of the second and third digits particularly in dorsiflexor he slips left foot.  Objective: Vital signs are stable he is alert and oriented x3 pulses are palpable.  He has flail toe second and third right with severe contracture deformities dorsally of the fourth and fifth right which more than likely resulting in the plantarflexed fifth metatarsal head and poor keratoma of the right foot.  Assessment: Contracted fourth and fifth metatarsal phalangeal joints.  Poor keratoma plantar aspect right foot.  Plan: Surgical debridement of this today with a 15 blade no bleeding was noted.  Also will discuss with him tenotomy's and I will follow-up with him in about 1 week for extensor tenotomy's of fourth and fifth digits of the right

## 2020-08-17 ENCOUNTER — Ambulatory Visit (INDEPENDENT_AMBULATORY_CARE_PROVIDER_SITE_OTHER): Payer: Medicare Other | Admitting: Podiatry

## 2020-08-17 ENCOUNTER — Other Ambulatory Visit: Payer: Self-pay

## 2020-08-17 ENCOUNTER — Encounter: Payer: Self-pay | Admitting: Podiatry

## 2020-08-17 DIAGNOSIS — M2450 Contracture, unspecified joint: Secondary | ICD-10-CM | POA: Diagnosis not present

## 2020-08-17 NOTE — Patient Instructions (Signed)
Leave bandage in place and dry for 4 days, then remove. You may wash foot normally after removal of bandage. DO NOT SOAK FOOT! Dry completely afterwards and may use a bandaid over incision if needed. We will follow up with you in 1 weeks for recheck.  

## 2020-08-17 NOTE — Progress Notes (Signed)
He presents today for follow-up of his contracted fourth and fifth digits of the right foot.  This is secondary to his back surgery and previous trauma.  Objective: Vital signs are stable he is alert oriented x3 severe contracture deformities at the fourth and fifth digit metatarsophalangeal joints.  Pulses remain strong and palpable.  Assessment: Contracted digits fourth fifth right metatarsophalangeal joint.  Plan: Discussed etiology pathology and surgical therapies after local anesthetic was administered the area was prepped and draped in normal sterile fashion 2 small stab wounds were made performed to Z lengthenings to the to extensor digitorum longus tendons of the toe sat rectus with weightbearing.  There is copiously washed with sterile saline dried thoroughly Dermabond was applied and then a dry sterile compressive dressing was applied to I will follow-up with him in 1 week to make sure he is doing well dispensed Darco shoe as well.

## 2020-08-21 IMAGING — MR MR LUMBAR SPINE W/O CM
5 series · 31 of 48 positions shown · non-contrast
Comparison: None.

CLINICAL DATA: Acute bilateral low back pain with bilateral
sciatica. Lumbar spondylosis.

EXAM:
MRI LUMBAR SPINE WITHOUT CONTRAST
TECHNIQUE: Multiplanar, multisequence MR imaging of the lumbar spine was
performed. No intravenous contrast was administered.

[Series 5: T2 · sagittal · 4.0mm · 0.81mm/px · 7 of 17 slices shown (1 of 2)]
[im 1/17]
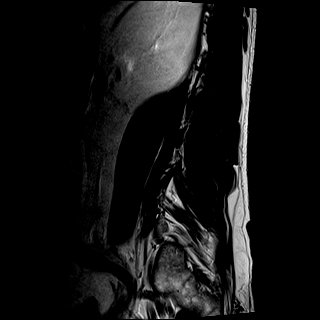
[im 3/17]
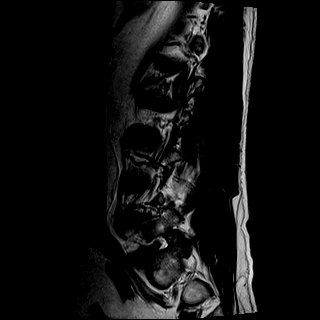
[im 6/17]
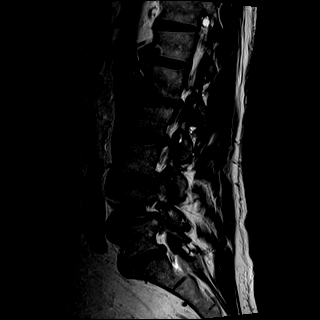
[im 9/17]
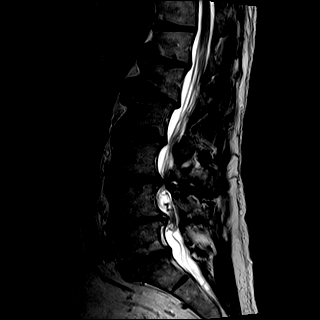
[im 11/17]
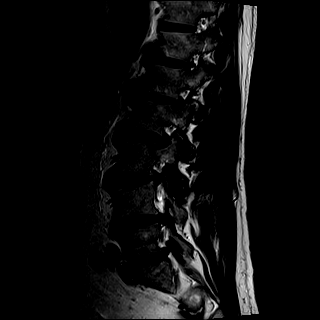
[im 14/17]
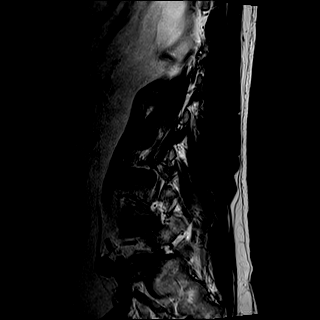
[im 17/17]
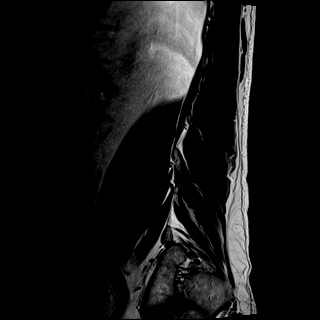

[Series 6: T1 · sagittal · 4.0mm · 0.81mm/px · 7 of 17 slices shown (1 of 2)]
[im 1/17]
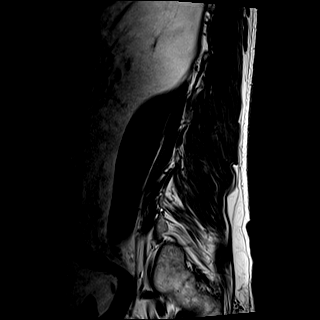
[im 3/17]
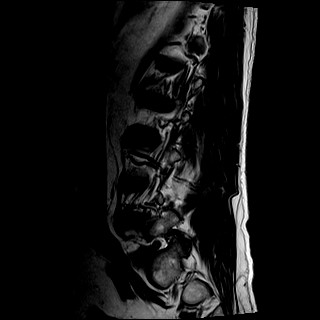
[im 6/17]
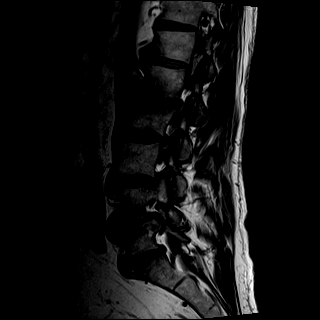
[im 9/17]
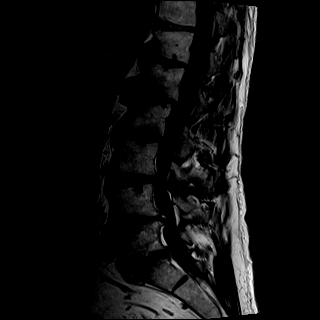
[im 11/17]
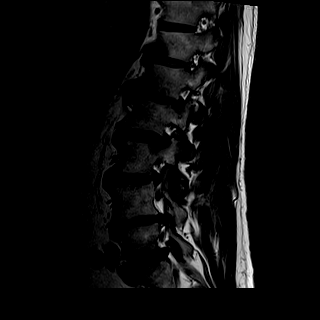
[im 14/17]
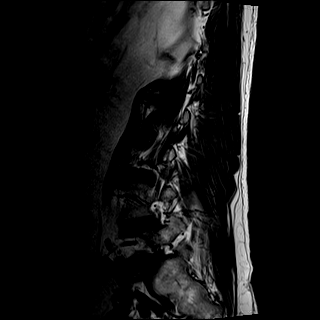
[im 17/17]
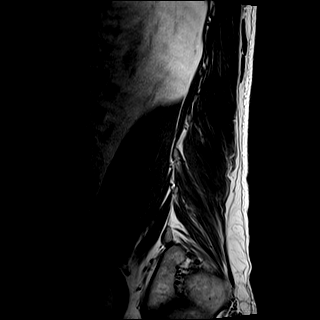

[Series 7: STIR · sagittal · 4.0mm · 0.41mm/px · 1 of 17 slices shown]
[im 1/17]
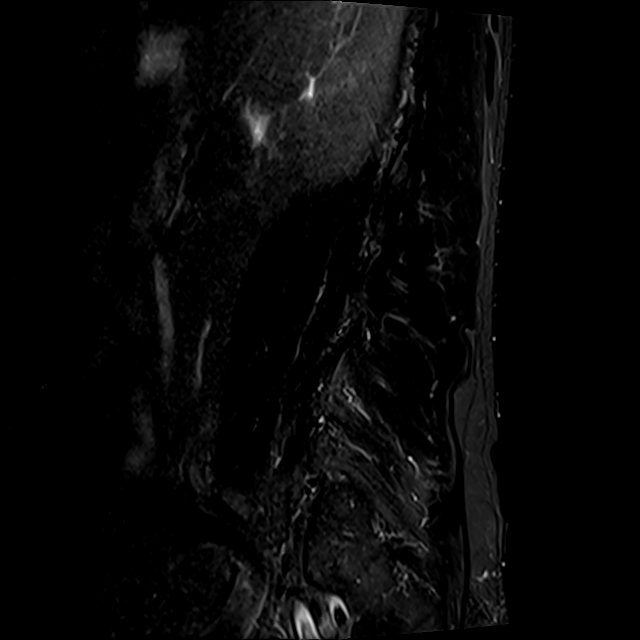

[Series 8: T2 · axial · 4.0mm · 0.78mm/px · z∈[-123,+97]mm · 8 of 38 slices shown (2 of 2)]
[im 1/38]
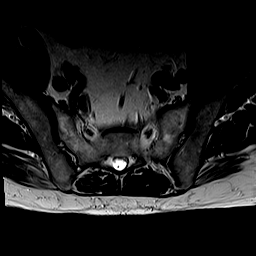
[im 6/38]
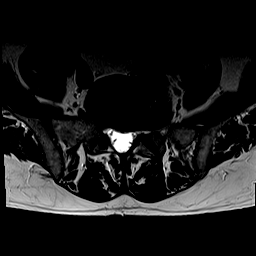
[im 12/38]
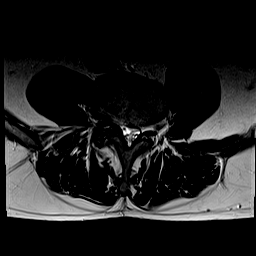
[im 18/38]
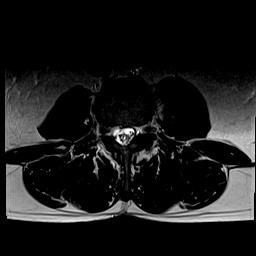
[im 20/38]
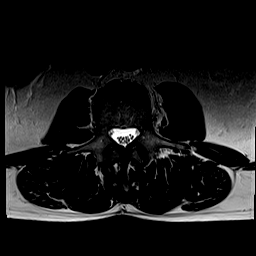
[im 26/38]
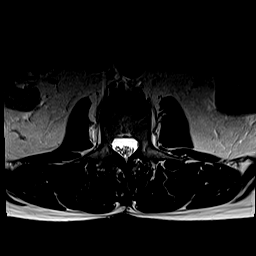
[im 32/38]
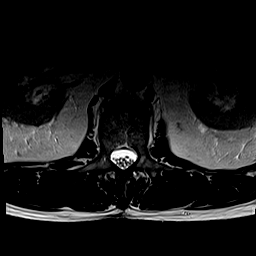
[im 38/38]
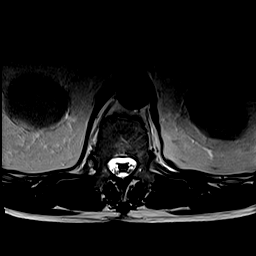

[Series 9: T1 · axial · 4.0mm · 0.39mm/px · z∈[-123,+97]mm · 8 of 38 slices shown (2 of 2)]
[im 1/38]
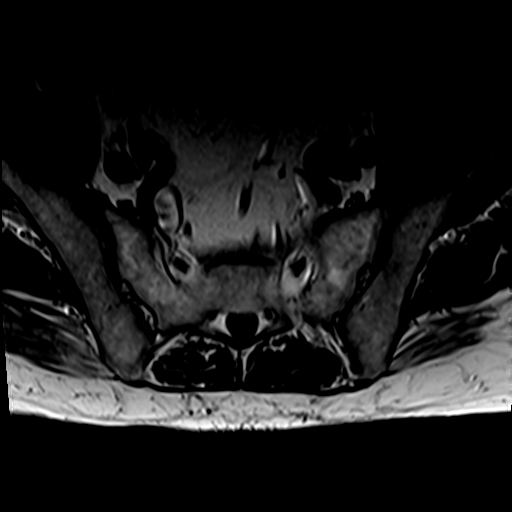
[im 6/38]
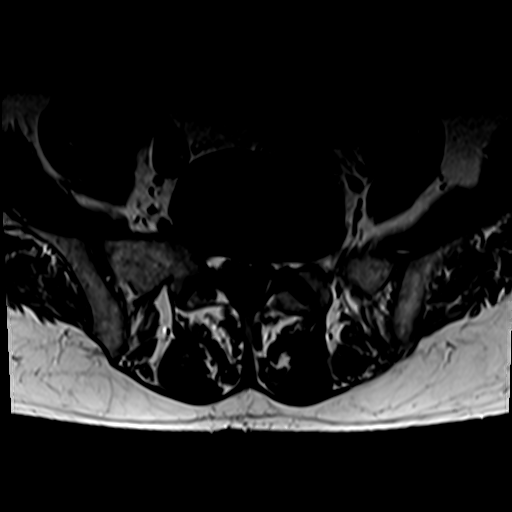
[im 12/38]
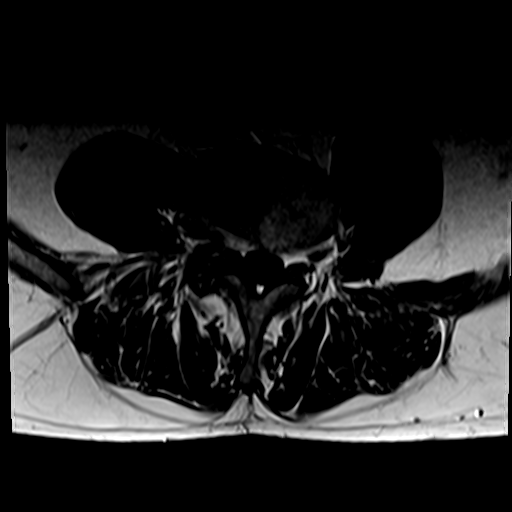
[im 18/38]
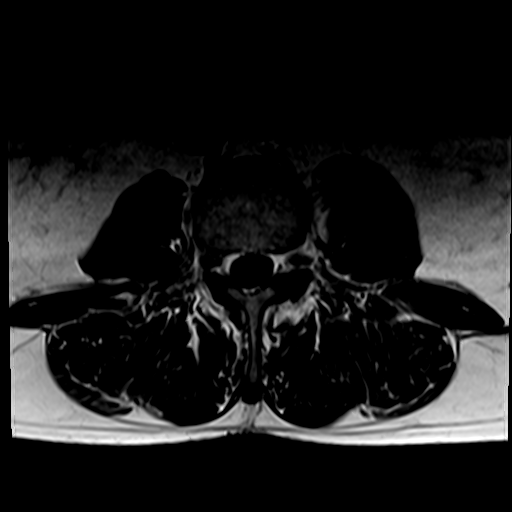
[im 20/38]
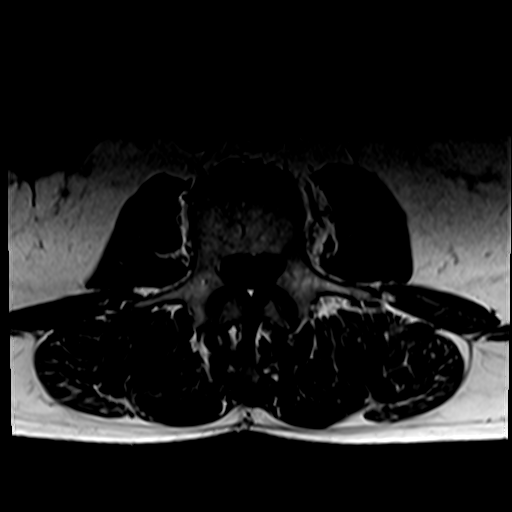
[im 26/38]
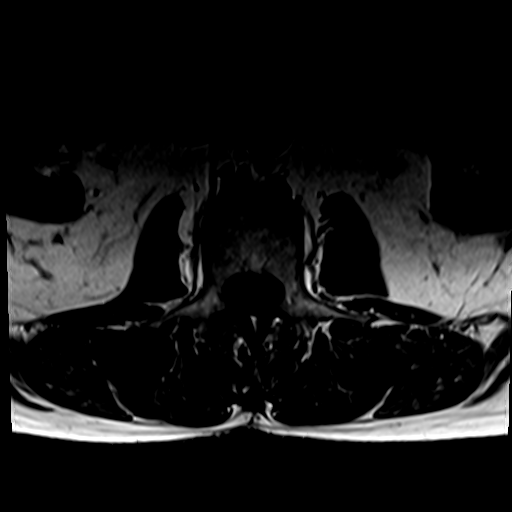
[im 32/38]
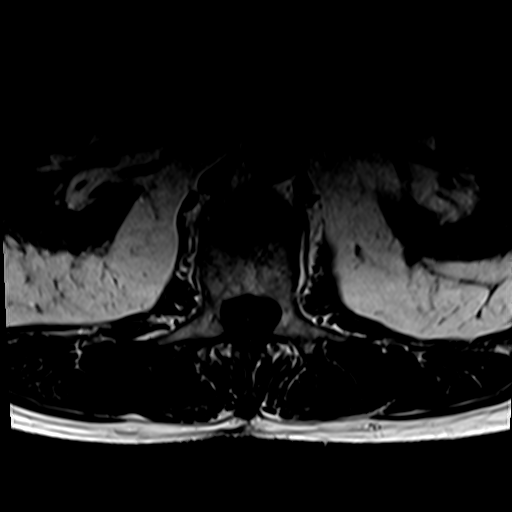
[im 38/38]
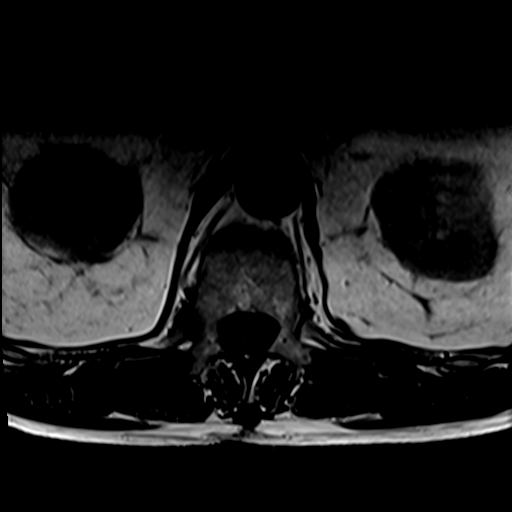

[31 of 48 positions shown; findings below may reference images not displayed]

FINDINGS: Segmentation:  Normal

Alignment:  Mild retrolisthesis L1-2 and L2-3.

Vertebrae: Negative for fracture or mass. Discogenic sclerosis and
edema on the right at L1-2 and L4-5. Discogenic changes on the left
at L5-S1

Conus medullaris and cauda equina: Conus extends to the L1 level.
Conus and cauda equina appear normal.

Paraspinal and other soft tissues: Negative for paraspinous mass or
adenopathy.

Disc levels:

T12-L1: Disc degeneration with Schmorl's node. Mild endplate
spurring on the left with mild subarticular stenosis on the left.

L1-2: Moderate to advanced disc degeneration with asymmetric
discogenic edema on the right. Diffuse endplate spurring causing
moderate subarticular stenosis bilaterally. Mild narrowing of the
spinal canal without significant stenosis.

L2-3: Disc degeneration with diffuse disc bulging. Moderately large
extraforaminal disc protrusion on the left with displacement of the
left L2 nerve root. Bilateral facet hypertrophy. Mild spinal
stenosis. Moderate subarticular stenosis bilaterally left greater
than right.

L3-4: Disc degeneration with broad-based central disc protrusion and
diffuse endplate spurring. Moderate facet and ligamentum flavum
hypertrophy. Moderate to severe spinal stenosis. Severe subarticular
stenosis bilaterally.

L4-5: Disc degeneration, asymmetric on the right with diffuse
endplate spurring right greater than left. Central and left-sided
disc protrusion. Moderate to severe spinal stenosis. Moderate to
severe subarticular stenosis bilaterally. Moderate foraminal
stenosis on the right due to spurring. Moderate facet degeneration
bilaterally

L5-S1: Moderate to advanced disc degeneration with asymmetric
discogenic edema on the left. Diffuse endplate spurring left greater
than right. Moderate subarticular and foraminal stenosis on the
left. Mild subarticular stenosis on the right.
IMPRESSION: Extensive multilevel degenerative changes throughout the lumbar
spine as described above.

Multilevel spinal and foraminal stenosis in the lumbar spine.
Moderate to severe spinal stenosis L3-4 and L4-5. Tangled nerve
roots in the thecal sac at the L4-L5 level due to spinal stenosis.

## 2020-08-24 ENCOUNTER — Other Ambulatory Visit: Payer: Self-pay

## 2020-08-24 ENCOUNTER — Ambulatory Visit (INDEPENDENT_AMBULATORY_CARE_PROVIDER_SITE_OTHER): Payer: Medicare Other | Admitting: Podiatry

## 2020-08-24 ENCOUNTER — Ambulatory Visit: Payer: Medicare Other | Admitting: Podiatry

## 2020-08-24 DIAGNOSIS — M2042 Other hammer toe(s) (acquired), left foot: Secondary | ICD-10-CM

## 2020-08-24 DIAGNOSIS — M2450 Contracture, unspecified joint: Secondary | ICD-10-CM

## 2020-08-24 DIAGNOSIS — Z9889 Other specified postprocedural states: Secondary | ICD-10-CM

## 2020-08-24 DIAGNOSIS — M2041 Other hammer toe(s) (acquired), right foot: Secondary | ICD-10-CM

## 2020-08-26 ENCOUNTER — Encounter: Payer: Self-pay | Admitting: Podiatry

## 2020-08-26 NOTE — Progress Notes (Signed)
  Subjective:  Patient ID: Scott Spence, male    DOB: [birth year],  MRN: [id number]  Chief Complaint  Patient presents with  . Routine Post Op    DOS: 08/17/2020 Procedure: Extensor tenotomy of fourth and fifth toes  70 y.o. male returns for post-op check.  Doing well, minimal pain.  He has been bathing.  He is worried that he will be able to extend the toes very well.  Review of Systems: Negative except as noted in the HPI. Denies N/V/F/Ch.   Objective:  There were no vitals filed for this visit. There is no height or weight on file to calculate BMI. Constitutional Well developed. Well nourished.  Vascular Foot warm and well perfused. Capillary refill normal to all digits.   Neurologic Normal speech. Oriented to person, place, and time. Epicritic sensation to light touch grossly present bilaterally.  Dermatologic Skin healing well without signs of infection. Skin edges well coapted without signs of infection.  Skin glue still intact  Orthopedic: Tenderness to palpation noted about the surgical site.  Toes are straight    Assessment:  No diagnosis found. Plan:  Patient was evaluated and treated and all questions answered.  S/p foot surgery right -Progressing as expected post-operatively. -WB Status: WBAT in regular shoes when tolerated -Sutures: Skin glue intact will dissolve on its own. -Medications: No medications required   Return in about 2 weeks (around 09/07/2020).

## 2020-09-07 ENCOUNTER — Ambulatory Visit (INDEPENDENT_AMBULATORY_CARE_PROVIDER_SITE_OTHER): Payer: Medicare Other | Admitting: Podiatry

## 2020-09-07 ENCOUNTER — Other Ambulatory Visit: Payer: Self-pay

## 2020-09-07 ENCOUNTER — Encounter: Payer: Self-pay | Admitting: Podiatry

## 2020-09-07 DIAGNOSIS — L905 Scar conditions and fibrosis of skin: Secondary | ICD-10-CM | POA: Diagnosis not present

## 2020-09-07 DIAGNOSIS — R52 Pain, unspecified: Secondary | ICD-10-CM | POA: Diagnosis not present

## 2020-09-07 MED ORDER — DEXAMETHASONE SODIUM PHOSPHATE 120 MG/30ML IJ SOLN
2.0000 mg | Freq: Once | INTRAMUSCULAR | Status: AC
  Administered 2020-09-07: 2 mg via INTRA_ARTICULAR

## 2020-09-07 NOTE — Progress Notes (Signed)
He presents today for follow-up of his tenotomy to the fourth and fifth toes of the right foot.  States that his toes are sitting much better he is very happy with the outcome of the position of the toes however he can only get up to about a half a mile of walking and before it starts to hurt.  He states that hurts right at the incision site.  Objective: Vital signs are stable he is alert oriented x3.  The lateralmost incision does not hurt and it is some loose and supple.  However the incision over the third interdigital space does demonstrate scarring and puckering of the tissue.  Most likely this is scarred down either to a nerve or deep tissues because it is mildly tender on attempted release.  Assessment: Painful scar tissue.  Plan: I injected scar tissue today with 10 mg of Kenalog directly into the scar itself deep and superficial hopefully this will break up the scar tissue and allow for free range of motion of the skin.  Also instructed him to massage very aggressively the scar.

## 2020-09-13 ENCOUNTER — Ambulatory Visit: Payer: Medicare Other | Admitting: Dermatology

## 2020-10-19 ENCOUNTER — Ambulatory Visit: Payer: Medicare Other | Admitting: Podiatry

## 2020-12-22 ENCOUNTER — Other Ambulatory Visit: Payer: Self-pay

## 2020-12-28 ENCOUNTER — Ambulatory Visit: Payer: Self-pay | Admitting: Urology

## 2021-01-04 ENCOUNTER — Other Ambulatory Visit: Payer: Self-pay

## 2021-01-04 ENCOUNTER — Ambulatory Visit (INDEPENDENT_AMBULATORY_CARE_PROVIDER_SITE_OTHER): Payer: Medicare Other | Admitting: Urology

## 2021-01-04 VITALS — BP 159/79 | HR 82 | Ht 67.0 in | Wt 187.0 lb

## 2021-01-04 DIAGNOSIS — R972 Elevated prostate specific antigen [PSA]: Secondary | ICD-10-CM | POA: Diagnosis not present

## 2021-01-04 DIAGNOSIS — N401 Enlarged prostate with lower urinary tract symptoms: Secondary | ICD-10-CM | POA: Diagnosis not present

## 2021-01-04 DIAGNOSIS — N138 Other obstructive and reflux uropathy: Secondary | ICD-10-CM

## 2021-01-04 NOTE — Patient Instructions (Signed)
Benign Prostatic Hyperplasia  Benign prostatic hyperplasia (BPH) is an enlarged prostate gland that is caused by the normal aging process and not by cancer. The prostate is a walnut-sized gland that is involved in the production of semen. It is located in front of the rectum and below the bladder. The bladder stores urine and the urethra is the tube that carries the urine out of the body. The prostate may get bigger asa man gets older. An enlarged prostate can press on the urethra. This can make it harder to pass urine. The build-up of urine in the bladder can cause infection. Back pressure and infection may progress to bladder damage and kidney (renal) failure. What are the causes? This condition is part of a normal aging process. However, not all men develop problems from this condition. If the prostate enlarges away from the urethra, urine flow will not be blocked. If it enlarges toward the urethra andcompresses it, there will be problems passing urine. What increases the risk? This condition is more likely to develop in men over the age of 50 years. What are the signs or symptoms? Symptoms of this condition include: Getting up often during the night to urinate. Needing to urinate frequently during the day. Difficulty starting urine flow. Decrease in size and strength of your urine stream. Leaking (dribbling) after urinating. Inability to pass urine. This needs immediate treatment. Inability to completely empty your bladder. Pain when you pass urine. This is more common if there is also an infection. Urinary tract infection (UTI). How is this diagnosed? This condition is diagnosed based on your medical history, a physical exam, and your symptoms. Tests will also be done, such as: A post-void bladder scan. This measures any amount of urine that may remain in your bladder after you finish urinating. A digital rectal exam. In a rectal exam, your health care provider checks your prostate by  putting a lubricated, gloved finger into your rectum to feel the back of your prostate gland. This exam detects the size of your gland and any abnormal lumps or growths. An exam of your urine (urinalysis). A prostate specific antigen (PSA) screening. This is a blood test used to screen for prostate cancer. An ultrasound. This test uses sound waves to electronically produce a picture of your prostate gland. Your health care provider may refer you to a specialist in kidney and prostate diseases (urologist). How is this treated? Once symptoms begin, your health care provider will monitor your condition (active surveillance or watchful waiting). Treatment for this condition will depend on the severity of your condition. Treatment may include: Observation and yearly exams. This may be the only treatment needed if your condition and symptoms are mild. Medicines to relieve your symptoms, including: Medicines to shrink the prostate. Medicines to relax the muscle of the prostate. Surgery in severe cases. Surgery may include: Prostatectomy. In this procedure, the prostate tissue is removed completely through an open incision or with a laparoscope or robotics. Transurethral resection of the prostate (TURP). In this procedure, a tool is inserted through the opening at the tip of the penis (urethra). It is used to cut away tissue of the inner core of the prostate. The pieces are removed through the same opening of the penis. This removes the blockage. Transurethral incision (TUIP). In this procedure, small cuts are made in the prostate. This lessens the prostate's pressure on the urethra. Transurethral microwave thermotherapy (TUMT). This procedure uses microwaves to create heat. The heat destroys and removes a small   amount of prostate tissue. Transurethral needle ablation (TUNA). This procedure uses radio frequencies to destroy and remove a small amount of prostate tissue. Interstitial laser coagulation ([place]).  This procedure uses a laser to destroy and remove a small amount of prostate tissue. Transurethral electrovaporization (TUVP). This procedure uses electrodes to destroy and remove a small amount of prostate tissue. Prostatic urethral lift. This procedure inserts an implant to push the lobes of the prostate away from the urethra. Follow these instructions at home: Take over-the-counter and prescription medicines only as told by your health care provider. Monitor your symptoms for any changes. Contact your health care provider with any changes. Avoid drinking large amounts of liquid before going to bed or out in public. Avoid or reduce how much caffeine or alcohol you drink. Give yourself time when you urinate. Keep all follow-up visits as told by your health care provider. This is important. Contact a health care provider if: You have unexplained back pain. Your symptoms do not get better with treatment. You develop side effects from the medicine you are taking. Your urine becomes very dark or has a bad smell. Your lower abdomen becomes distended and you have trouble passing your urine. Get help right away if: You have a fever or chills. You suddenly cannot urinate. You feel lightheaded, or very dizzy, or you faint. There are large amounts of blood or clots in the urine. Your urinary problems become hard to manage. You develop moderate to severe low back or flank pain. The flank is the side of your body between the ribs and the hip. These symptoms may represent a serious problem that is an emergency. Do not wait to see if the symptoms will go away. Get medical help right away. Call your local emergency services (911 in the [place] Do not drive yourself to the hospital. Summary Benign prostatic hyperplasia (BPH) is an enlarged prostate that is caused by the normal aging process and not by cancer. An enlarged prostate can press on the urethra. This can make it hard to pass urine. This  condition is part of a normal aging process and is more likely to develop in men over the age of 58 years. Get help right away if you suddenly cannot urinate. This information is not intended to replace advice given to you by your health care provider. Make sure you discuss any questions you have with your healthcare provider. Document Revised: 03/17/2020 Document Reviewed: 03/17/2020 Elsevier Patient Education  2022 [employer]. Prostate Cancer Screening  Prostate cancer screening is a test that is done to check for the presence of prostate cancer in men. The prostate gland is a walnut-sized gland that is located below the bladder and in front of the rectum in males. The function of the prostate is to add fluid to semen during ejaculation. Prostate cancer isthe second most common type of cancer in men. Who should have prostate cancer screening?  Screening recommendations vary based on age and other risk factors. Screening is recommended if: You are older than age 26. If you are age 29-69, talk with your health care provider about your need for screening and how often screening should be done. Because most prostate cancers are slow growing and will not cause death, screening is generally reserved in this age group for men who have a 10-15-year life expectancy. You are younger than age 42, and you have these risk factors: Being a black male or a male of African descent. Having a father, brother, or uncle  who has been diagnosed with prostate cancer. The risk is higher if your family member's cancer occurred at an early age. Screening is not recommended if: You are younger than age 60. You are between the ages of 83 and 65 and you have no risk factors. You are 70 years of age or older. At this age, the risks that screening can cause are greater than the benefits that it may provide. If you are at high risk for prostate cancer, your health care provider may recommend that you have screenings more  often or that you start screening at ayounger age. How is screening for prostate cancer done? The recommended prostate cancer screening test is a blood test called the prostate-specific antigen (PSA) test. PSA is a protein that is made in the prostate. As you age, your prostate naturally produces more PSA. Abnormally high PSA levels may be caused by: Prostate cancer. An enlarged prostate that is not caused by cancer (benign prostatic hyperplasia, BPH). This condition is very common in older men. A prostate gland infection (prostatitis). Depending on the PSA results, you may need more tests, such as: A physical exam to check the size of your prostate gland. Blood and imaging tests. A procedure to remove tissue samples from your prostate gland for testing (biopsy). What are the benefits of prostate cancer screening? Screening can help to identify cancer at an early stage, before symptoms start and when the cancer can be treated more easily. There is a small chance that screening may lower your risk of dying from prostate cancer. The chance is small because prostate cancer is a slow-growing cancer, and most men with prostate cancer die from a different cause. What are the risks of prostate cancer screening? The main risk of prostate cancer screening is diagnosing and treating prostate cancer that would never have caused any symptoms or problems. This is called overdiagnosisand overtreatment. PSA screening cannot tell you if your PSA is high due to cancer or a different cause. A prostate biopsy is the only procedure to diagnose prostate cancer. Even the results of a biopsy may not tell you if your cancer needs to be treated. Slow-growing prostate cancer may not need any treatment other than monitoring, so diagnosing and treating it may causeunnecessary stress or other side effects. A prostate biopsy may also cause: Infection or fever. A false negative. This is a result that shows that you do not have  prostate cancer when you actually do have prostate cancer. Questions to ask your health care provider When should I start prostate cancer screening? What is my risk for prostate cancer? How often do I need screening? What type of screening tests do I need? How do I get my test results? What do my results mean? Do I need treatment? Where to find more information The American Cancer Society: www.cancer.org American Urological Association: www.auanet.org Contact a health care provider if: You have difficulty urinating. You have pain when you urinate or ejaculate. You have blood in your urine or semen. You have pain in your back or in the area of your prostate. Summary Prostate cancer is a common type of cancer in men. The prostate gland is located below the bladder and in front of the rectum. This gland adds fluid to semen during ejaculation. Prostate cancer screening may identify cancer at an early stage, when the cancer can be treated more easily. The prostate-specific antigen (PSA) test is the recommended screening test for prostate cancer. Discuss the risks and benefits of prostate  cancer screening with your health care provider. If you are age 35 or older, the risks that screening can cause are greater than the benefits that it may provide. This information is not intended to replace advice given to you by your health care provider. Make sure you discuss any questions you have with your healthcare provider. Document Revised: 07/07/2020 Document Reviewed: 02/19/2019 Elsevier Patient Education  [place].

## 2021-01-04 NOTE — Progress Notes (Signed)
   01/04/2021 1:00 PM   Scott Spence 01-28-1951 [id number]  Reason for visit: Follow up BPH, elevated PSA  HPI: 70 year old male with long history of mildly elevated PSA as well as BPH on 0 point milligrams Flomax nightly.  PSA has ranged from 5-7 over the last few years including a negative prostate biopsy in August 2018 with Dr. Pilar Jarvis for PSA of 6.6 which showed a 50 g prostate with inflammation but no malignancy.  He has no family history of prostate cancer.  His PSA had increased to 8.9 with 19% free which prompted a 4K score, this showed a PSA of 7.38 and 11% risk of clinically significant prostate cancer.  We discussed options previously at length including biopsy, prostate MRI, repeat PSA, and he opted for a repeat PSA.  Most recent PSA has dropped down back to his baseline at 6.7.  We again options including prostate biopsy, prostate MRI, or trending the PSA.  The drop back down to his baseline 3 years ago when he had a negative biopsy is certainly reassuring.  Regarding his urinary symptoms, if he does not take the Flomax he has significant problems with voiding with weak stream and straining.  We briefly discussed options like UroLift or HOLEP as well as the risks and benefits, but he is not interested in outlet procedures at this point.  RTC 1 year with PSA prior, PVR Continue Flomax 0.8 mg nightly   Billey Co, MD  [facility] [address], [place],  [address] [phone]

## 2021-07-19 ENCOUNTER — Encounter: Payer: Self-pay | Admitting: *Deleted

## 2021-07-26 ENCOUNTER — Ambulatory Visit (INDEPENDENT_AMBULATORY_CARE_PROVIDER_SITE_OTHER): Payer: Medicare Other | Admitting: Podiatry

## 2021-07-26 ENCOUNTER — Ambulatory Visit (INDEPENDENT_AMBULATORY_CARE_PROVIDER_SITE_OTHER): Payer: Medicare Other

## 2021-07-26 ENCOUNTER — Other Ambulatory Visit: Payer: Self-pay

## 2021-07-26 ENCOUNTER — Encounter: Payer: Self-pay | Admitting: Podiatry

## 2021-07-26 DIAGNOSIS — D2371 Other benign neoplasm of skin of right lower limb, including hip: Secondary | ICD-10-CM | POA: Diagnosis not present

## 2021-07-26 DIAGNOSIS — M2042 Other hammer toe(s) (acquired), left foot: Secondary | ICD-10-CM | POA: Diagnosis not present

## 2021-07-26 DIAGNOSIS — M7661 Achilles tendinitis, right leg: Secondary | ICD-10-CM | POA: Diagnosis not present

## 2021-07-26 DIAGNOSIS — D2372 Other benign neoplasm of skin of left lower limb, including hip: Secondary | ICD-10-CM

## 2021-07-26 DIAGNOSIS — M2041 Other hammer toe(s) (acquired), right foot: Secondary | ICD-10-CM

## 2021-07-26 DIAGNOSIS — M7751 Other enthesopathy of right foot: Secondary | ICD-10-CM | POA: Diagnosis not present

## 2021-07-26 MED ORDER — TRIAMCINOLONE ACETONIDE 40 MG/ML IJ SUSP
20.0000 mg | Freq: Once | INTRAMUSCULAR | Status: AC
  Administered 2021-07-26: 20 mg

## 2021-07-26 NOTE — Progress Notes (Signed)
He presents today with chief complaint of pain to the lateral ankle right he states that sometime ago he lost muscle use of his lateral muscles he thought it was from an injury to his ankle.  He does relate back surgery as well.  He is also complaining of painful lesions plantar aspect of the right foot as well as a painful corn to the hallux left foot.  Objective: Vital signs are stable he is alert and oriented x3 pulses are palpable.  He has absolutely no peroneal tendon or muscle tone at all otherwise all of the other muscles are 5 out of 5 dorsiflexors plantar flexors inverters and everters left dorsiflexors inverters and plantar flexors on the right.  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  Right foot demonstrates hammertoe deformities.  Assessment benign skin lesions to the lateral hallux left and plantar forefoot right.  Osteoarthritis subtalar joint right.  Complete loss of motor function to the peroneal tendons most likely associated with back injury and surgery.  Plan: Discussed etiology pathology conservative surgical therapies offered him an injection of 10 mg Kenalog 5 mg Marcaine to the subtalar joint right foot.  Due to the arthritis it was very hard to get it into the subtalar joint completely.  But this was performed after sterile process and prepping of the skin.  We also discussed the inversion of his foot at resting.  We discussed possible brace or hightop boots.  He like to talk try hightop tennis shoes or boots at this point.  I debrided all benign skin lesions and placed padding.  We discussed the possible need for surgery for the exostectomy of the hallux left.  I will follow-up with him on an as-needed basis.  We did order some pads for him.

## 2021-08-21 ENCOUNTER — Other Ambulatory Visit: Payer: Self-pay

## 2021-08-21 ENCOUNTER — Ambulatory Visit: Payer: Medicare Other

## 2021-08-21 DIAGNOSIS — M7751 Other enthesopathy of right foot: Secondary | ICD-10-CM

## 2021-08-21 DIAGNOSIS — Z9889 Other specified postprocedural states: Secondary | ICD-10-CM

## 2021-08-21 DIAGNOSIS — M7661 Achilles tendinitis, right leg: Secondary | ICD-10-CM

## 2021-08-21 NOTE — Progress Notes (Signed)
SITUATION Patient Name:  Scott Spence MRN:   [id number] Reason for Visit: Evaluation for Articulated AFO  Patient Report: Chief Complaint:   Ankle rolls out [name] of Discomfort/Pain:  Ambulatory Standing Resting Location:    right lower extremity Onset & Duration:   Gradual and Present longer than 3 months Course:    gradually worsening Aggravating or Alleviating Factors: Ambulation  OBJECTIVE DATA & MEASUREMENTS Prognosis:    Good Duration of use:   5 years  Diagnosis:   ICD-10-CM   1. Achilles tendinitis of right lower extremity  M76.61     2. Capsulitis of right ankle  M77.51     3. Post-operative state  Z98.890      GOALS, NECESSITIES, & JUSTIFICATIONS Recommended Device: [place] Articulated AFO Color:    Black Closure:   Laces  Laterality HCPCS Code Description Justification  right [id number] Plastic orthosis, custom molded from a model of the patient, custom fabricated, includes casting and cast preparation. Necessary to provide triplanar support to the foot/ankle complex  right L2330 Addition to lower extremity, lacer molded to patient model Necessary to ensure secure hold of orthosis to patient's limb  right L2820 Addition to lower extremity orthosis, soft interface for molded plastic below knee section Necessary to relieve pressure on bony prominences    I certify that [name] qualifies for and will benefit from an ankle foot orthosis used during ambulation based on meeting all of the following criteria;   The patient is: - Ambulatory, and - Has weakness or deformity of the foot and ankle, and - Requires stabilization for medical reasons, and - Has the potential to benefit functionally  The patients medical record contains sufficient documentation of the patients medical condition to substantiate the necessity for the type and quantity of the items ordered.  The goals of this therapy: - Improve Mobility - Improve Lower Extremity Stability - Decrease Pain -  Facilitate Soft Tissue Healing - Facilitate Immobilization, healing and treatment of an injury  Necessity of Ankle Foot Orthotic molded to patient model: A custom (vs. prefabricated) ankle foot orthosis has been prescribed based on the following criteria which are specific to the condition of this patient; - The patient could not be fit with a prefabricated AFO - The condition necessitating the orthosis is expected to be permanent or of longstanding duration (more than 6 months) - There is need to control the ankle or foot in more than one plane - The patient has a documented neurological, circulatory, or orthopedic condition that requires custom fabrication over a model to prevent tissue injury - The patient has a healing fracture that lacks normal anatomical integrity or anthropometric proportions  I hereby certify that the ankle foot orthotic described above is a rigid or semi-rigid device which is used for the purpose of supporting a weak or deformed body member or restricting or eliminating motion in a diseased or injured part of the body. It is designed to provide support and counterforce on the limb or body part that is being braced. In my opinion, the custom molded ankle foot orthosis is both reasonable and necessary in reference to accepted standards of medical practice in the treatment  of the patient condition and rehabilitation.  ACTIONS PERFORMED Patient was evaluated and casted for Specialty AFO via [employer]. Procedure was explained to patient. Patient tolerated procedure. patient selected device color and closure method.   PLAN Patient to return in four to six weeks for fitting and delivery of device. Plan of  care was explained to and agreed upon by patient. All questions were answered and concerns addressed.

## 2021-09-22 ENCOUNTER — Telehealth: Payer: Self-pay

## 2021-09-22 NOTE — Telephone Encounter (Signed)
Lvm for pt to call back to schedule appt to pick up brace

## 2021-09-29 ENCOUNTER — Other Ambulatory Visit: Payer: Self-pay

## 2021-09-29 ENCOUNTER — Ambulatory Visit (INDEPENDENT_AMBULATORY_CARE_PROVIDER_SITE_OTHER): Payer: Medicare Other

## 2021-09-29 DIAGNOSIS — M7661 Achilles tendinitis, right leg: Secondary | ICD-10-CM

## 2021-09-29 DIAGNOSIS — M7751 Other enthesopathy of right foot: Secondary | ICD-10-CM | POA: Diagnosis not present

## 2021-09-29 DIAGNOSIS — M2450 Contracture, unspecified joint: Secondary | ICD-10-CM | POA: Diagnosis not present

## 2021-09-29 NOTE — Progress Notes (Signed)
SITUATION ?Reason for Visit: Dispensation and Fitting of Custom Molded Gauntlet ?Patient Report: Patient reports comfort in ambulation and understands all instructions. ? ?OBJECTIVE DATA ?Patient History / Diagnosis:   ?  ICD-10-CM   ?1. Achilles tendinitis of right lower extremity  M76.61   ?  ?2. Capsulitis of right ankle  M77.51   ?  ?3. 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  ? ? ?Provided Device:  Dietitian Gauntlet: [place] ?    Production Number: [id number] ? ?Goals of Orthosis: ?- Improve gait ?- Decrease energy expenditure during the gait cycle ?- Improve balance ?- Stabilize motion at ankle and subtalar joint ?- Compensate for muscle weakness ?- Facilitate motion ?- Provide triplanar ankle and foot stabilization for weight bearing activities ? ?Device Justification: ?- Patient is ambulatory  ?- Device is medically necessary as part of the overall treatment due to the patient's condition and related symptoms ?- It is anticipated that the patient will benefit functionally with use of the device.  ?- The custom device is utilized in an attempt to avoid the need for surgery and because a prefabricated device is inappropriate. ? ?Upon gait analysis, the device appeared to be fitting well and the patient states that the device is comfortable. ? ?ACTIONS PERFORMED ?Patient was fit with Dance movement psychotherapist. Patient tolerated fitting procedure. Fit of the device is good. Patient was able to apply properly and ambulate without distress. Device function is to restrict and limit motion and provide stabilization in the ankle joint.  ? ?Goals and function of this device were explained in detail to the patient. The patient was shown how to properly apply, wear, and care for the device. It was explained that the device will fit and function best in an adjustable-closure shoe with a firm heel counter and a wide base of support. When the device was dispensed, it was suitable for the patient's condition  and not substandard. No guarantees were given. Precautions were reviewed.  ? ?Written instructions, warranty information, and a copy of DMEPOS Supplier Standards were provided. All questions answered and concerns addressed. ? ?PLAN ?Patient is to follow up in one week or as necessary (PRN). Plan of care was discussed with and agreed upon by patient. ? ? ?

## 2021-10-10 ENCOUNTER — Other Ambulatory Visit: Payer: Self-pay

## 2021-10-10 ENCOUNTER — Ambulatory Visit: Payer: Medicare Other

## 2021-10-10 DIAGNOSIS — M7661 Achilles tendinitis, right leg: Secondary | ICD-10-CM

## 2021-10-10 DIAGNOSIS — M7751 Other enthesopathy of right foot: Secondary | ICD-10-CM

## 2021-10-10 NOTE — Progress Notes (Signed)
SITUATION ?Reason for Consult: Follow-up with right AFO ?Patient / Caregiver Report: Patient reports pain in base of 5th and lateral malleolus ? ?OBJECTIVE DATA ?History / Diagnosis:  ?  ICD-10-CM   ?1. Achilles tendinitis of right lower extremity  M76.61   ?  ?2. Capsulitis of right ankle  M77.51   ?  ? ? ?Change in Pathology: None ? ?ACTIONS PERFORMED ?Patient's equipment was checked for structural stability and fit. Heated and flared base of 5th and lateral ankle joint. Patient reports comfort. Device(s) intact and fit is excellent. All questions answered and concerns addressed. ? ?PLAN ?Follow-up as needed (PRN). Plan of care discussed with and agreed upon by patient / caregiver. ? ?

## 2021-10-20 ENCOUNTER — Ambulatory Visit: Payer: Medicare Other

## 2021-10-20 DIAGNOSIS — M7661 Achilles tendinitis, right leg: Secondary | ICD-10-CM

## 2021-10-20 DIAGNOSIS — Z9889 Other specified postprocedural states: Secondary | ICD-10-CM

## 2021-10-20 DIAGNOSIS — M7751 Other enthesopathy of right foot: Secondary | ICD-10-CM

## 2021-10-20 NOTE — Progress Notes (Signed)
SITUATION ?Reason for Consult: Follow-up with right [place] articulated aFO ?Patient / Caregiver Report: Patient reports there is still some pain in the base of 5th ? ?OBJECTIVE DATA ?History / Diagnosis:  ?  ICD-10-CM   ?1. Post-operative state  Z98.890   ?  ?2. Capsulitis of right ankle  M77.51   ?  ?3. Achilles tendinitis of right lower extremity  M76.61   ?  ? ? ?Change in Pathology: None ? ?ACTIONS PERFORMED ?Patient's equipment was checked for structural stability and fit. Heated and relieved orthosis. Device(s) intact and fit is excellent. All questions answered and concerns addressed. ? ?PLAN ?Follow-up as needed (PRN). Plan of care discussed with and agreed upon by patient / caregiver. ? ?

## 2022-01-02 ENCOUNTER — Other Ambulatory Visit: Payer: Self-pay

## 2022-01-02 ENCOUNTER — Other Ambulatory Visit: Payer: Medicare Other

## 2022-01-02 DIAGNOSIS — R972 Elevated prostate specific antigen [PSA]: Secondary | ICD-10-CM

## 2022-01-03 LAB — PSA: Prostate Specific Ag, Serum: 8.6 ng/mL — ABNORMAL HIGH (ref 0.0–4.0)

## 2022-01-04 ENCOUNTER — Ambulatory Visit: Payer: Self-pay | Admitting: Urology

## 2022-01-04 ENCOUNTER — Ambulatory Visit (INDEPENDENT_AMBULATORY_CARE_PROVIDER_SITE_OTHER): Payer: Medicare Other | Admitting: Urology

## 2022-01-04 ENCOUNTER — Encounter: Payer: Self-pay | Admitting: Urology

## 2022-01-04 VITALS — BP 117/77 | HR 92 | Ht 67.5 in

## 2022-01-04 DIAGNOSIS — R972 Elevated prostate specific antigen [PSA]: Secondary | ICD-10-CM

## 2022-01-04 DIAGNOSIS — N138 Other obstructive and reflux uropathy: Secondary | ICD-10-CM

## 2022-01-04 DIAGNOSIS — N401 Enlarged prostate with lower urinary tract symptoms: Secondary | ICD-10-CM | POA: Diagnosis not present

## 2022-01-04 LAB — BLADDER SCAN AMB NON-IMAGING

## 2022-01-04 MED ORDER — TAMSULOSIN HCL 0.4 MG PO CAPS: 0.8000 mg | ORAL_CAPSULE | Freq: Every day | ORAL | 6 refills | Status: DC

## 2022-01-04 NOTE — Progress Notes (Signed)
   01/04/2022 3:58 PM   Scott Spence [birth year] [id number]  Reason for visit: Follow up elevated PSA, BPH  HPI: 71 year old male with a long history of mildly elevated PSA, as well as BPH on 0.8 mg Flomax nightly.  PSA has ranged from 5-9 over the last few years including a negative prostate biopsy in August 2018 with Dr. Pilar Jarvis for a PSA of 6.6 which showed a 50 g prostate with inflammation but no malignancy.  No family history of prostate cancer.  PSA increased to 8.9 with 19% free which prompted a 4K score.  This showed a PSA of 7.38 and 11% risk of clinically significant prostate cancer.  We had again discussed options at that time including an MRI, repeat PSA, or biopsy, and he opted to continue to trend the PSA.  It dropped back down to 6.7 at that time in May 2022.  Most recent PSA in June 2023 stable at 8.6 from 6.7 last year, and 8.9 in August 2021.  We again discussed options including a prostate MRI for further evaluation but he would like to continue to trend the PSA with his prior negative work-up, which I think is reasonable.  DRE today 80 g, smooth, no nodules or masses.  He continues on the max dose Flomax, if he misses a dose he notices significant worsening of his urination.  He would like to try to get off medications if possible and is more interested in outlet procedures today.  We reviewed differences between UroLift and HOLEP, and that optimal procedures based on prostate volume anatomy and side effect profile.  We reviewed that UroLift is a 10-minute procedure done under anesthesia and typically does not require catheter.  Postop patients can expect 1 to 2 weeks of irritative voiding symptoms, but by 3 to 4 weeks most patients are doing well.  There is a 6% reintervention rate at 10 years. We discussed the risks and benefits of HoLEP at length.  The procedure requires general anesthesia and takes 1 to 2 hours, and a holmium laser is used to enucleate the prostate and push this  tissue into the bladder.  A morcellator is then used to remove this tissue, which is sent for pathology.  The vast majority(>95%) of patients are able to discharge the same day with a catheter in place for 2 to 3 days, and will follow-up in clinic for a voiding trial.  We specifically discussed the risks of bleeding, infection, retrograde ejaculation, temporary urgency and urge incontinence, very low risk of long-term incontinence, urethral stricture/bladder neck contracture, pathologic evaluation of prostate tissue and possible detection of prostate cancer or other malignancy, and possible need for additional procedures.  -Flomax refilled -He is interested in outlet procedures, and likely will pursue cystoscopy and TRUS in the next few months after an upcoming orthopedic procedure, okay to schedule when he desires -Repeat PSA with reflex to free 1 year   Billey Co, MD  [facility] [address] [place], [place] [address] [phone]

## 2022-01-04 NOTE — Patient Instructions (Signed)
Prostate Cancer Screening  Prostate cancer screening is testing that is done to check for the presence of prostate cancer in men. The prostate gland is a walnut-sized gland that is located below the bladder and in front of the rectum in males. The function of the prostate is to add fluid to semen during ejaculation. Prostate cancer is one of the most common types of cancer in men. Who should have prostate cancer screening? Screening recommendations vary based on age and other risk factors, as well as between the professional organizations who make the recommendations. In general, screening is recommended if: You are age 50 to 70 and have an average risk for prostate cancer. You should talk with your health care provider about your need for screening and how often screening should be done. Because most prostate cancers are slow growing and will not cause death, screening in this age group is generally reserved for men who have a 10- to 15-year life expectancy. You are younger than age 50, and you have these risk factors: Having a father, brother, or uncle who has been diagnosed with prostate cancer. The risk is higher if your family member's cancer occurred at an early age or if you have multiple family members with prostate cancer at an early age. Being a male who is Black or is of Caribbean or sub-Saharan African descent. In general, screening is not recommended if: You are younger than age 40. You are between the ages of 40 and 49 and you have no risk factors. You are 70 years of age or older. At this age, the risks that screening can cause are greater than the benefits that it may provide. If you are at high risk for prostate cancer, your health care provider may recommend that you have screenings more often or that you start screening at a younger age. How is screening for prostate cancer done? The recommended prostate cancer screening test is a blood test called the prostate-specific antigen  (PSA) test. PSA is a protein that is made in the prostate. As you age, your prostate naturally produces more PSA. Abnormally high PSA levels may be caused by: Prostate cancer. An enlarged prostate that is not caused by cancer (benign prostatic hyperplasia, or BPH). This condition is very common in older men. A prostate gland infection (prostatitis) or urinary tract infection. Certain medicines such as male hormones (like testosterone) or other medicines that raise testosterone levels. A rectal exam may be done as part of prostate cancer screening to help provide information about the size of your prostate gland. When a rectal exam is performed, it should be done after the PSA level is drawn to avoid any effect on the results. Depending on the PSA results, you may need more tests, such as: A physical exam to check the size of your prostate gland, if not done as part of screening. Blood and imaging tests. A procedure to remove tissue samples from your prostate gland for testing (biopsy). This is the only way to know for certain if you have prostate cancer. What are the benefits of prostate cancer screening? Screening can help to identify cancer at an early stage, before symptoms start and when the cancer can be treated more easily. There is a small chance that screening may lower your risk of dying from prostate cancer. The chance is small because prostate cancer is a slow-growing cancer, and most men with prostate cancer die from a different cause. What are the risks of prostate cancer screening? The   main risk of prostate cancer screening is diagnosing and treating prostate cancer that would never have caused any symptoms or problems. This is called overdiagnosisand overtreatment. PSA screening cannot tell you if your PSA is high due to cancer or a different cause. A prostate biopsy is the only procedure to diagnose prostate cancer. Even the results of a biopsy may not tell you if your cancer needs to  be treated. Slow-growing prostate cancer may not need any treatment other than monitoring, so diagnosing and treating it may cause unnecessary stress or other side effects. Questions to ask your health care provider When should I start prostate cancer screening? What is my risk for prostate cancer? How often do I need screening? What type of screening tests do I need? How do I get my test results? What do my results mean? Do I need treatment? Where to find more information The American Cancer Society: www.cancer.org American Urological Association: www.auanet.org Contact a health care provider if: You have difficulty urinating. You have pain when you urinate or ejaculate. You have blood in your urine or semen. You have pain in your back or in the area of your prostate. Summary Prostate cancer is a common type of cancer in men. The prostate gland is located below the bladder and in front of the rectum. This gland adds fluid to semen during ejaculation. Prostate cancer screening may identify cancer at an early stage, when the cancer can be treated more easily and is less likely to have spread to other areas of the body. The prostate-specific antigen (PSA) test is the recommended screening test for prostate cancer, but it has associated risks. Discuss the risks and benefits of prostate cancer screening with your health care provider. If you are age 5 or older, the risks that screening can cause are greater than the benefits that it may provide. This information is not intended to replace advice given to you by your health care provider. Make sure you discuss any questions you have with your health care provider. Document Revised: 01/02/2021 Document Reviewed: 01/02/2021 Elsevier Patient Education  [place].  Prostatic Urethral Lift  Prostatic urethral lift is a surgical procedure to treat symptoms of prostate gland enlargement that occurs with age (benign prostatic hypertrophy,  BPH). The urethra passes between the two lobes of the prostate. The urethra is the part of the body that drains urine from the bladder. As the prostate enlarges, it can push on the urethra and cause problems with urinating. This procedure involves placing an implant that holds the prostate away from the urethra. The procedure is done using a thin device called a cystoscope. The device is inserted through the tip of the penis and moved up the urethra to the prostate. This is less invasive than other procedures that require an incision. You may have this procedure if: You have symptoms of BPH. Your prostate is not severely enlarged. Medicines to treat BPH are not working or not tolerated. You want to avoid possible sexual side effects from medicines or other procedures that are used to treat BPH. Tell a health care provider about: Any allergies you have. All medicines you are taking, including vitamins, herbs, eye drops, creams, and over-the-counter medicines. Any problems you or family members have had with anesthetic medicines. Any bleeding problems you have. Any surgeries you have had. Any medical conditions you have. What are the risks? Generally, this is a safe procedure. However, problems may occur, including: Bleeding. Infection. Leaking of urine (incontinence). Allergic reactions  to medicines. Return of BPH symptoms after 2 years, requiring more treatment. What happens before the procedure? When to stop eating and drinking Follow instructions from your health care provider about what you may eat and drink before your procedure. These may include: 8 hours before your procedure Stop eating most foods. Do not eat meat, fried foods, or fatty foods. Eat only light foods, such as toast or crackers. All liquids are okay except energy drinks and alcohol. 6 hours before your procedure Stop eating. Drink only clear liquids, such as water, clear fruit juice, black coffee, plain tea, and  sports drinks. Do not drink energy drinks or alcohol. 2 hours before your procedure Stop drinking all liquids. You may be allowed to take medicines with small sips of water. If you do not follow your health care provider's instructions, your procedure may be delayed or canceled. Medicines Ask your health care provider about: Changing or stopping your regular medicines. This is especially important if you are taking diabetes medicines or blood thinners. Taking medicines such as aspirin and ibuprofen. These medicines can thin your blood. Do not take these medicines unless your health care provider tells you to take them. Taking over-the-counter medicines, vitamins, herbs, and supplements. Surgery safety Ask your health care provider what steps will be taken to help prevent infection. These steps may include: Removing hair at the surgery site. Washing skin with a germ-killing soap. Taking antibiotic medicine. General instructions Do not use any products that contain nicotine or tobacco for at least 4 weeks before the procedure. These products include cigarettes, chewing tobacco, and vaping devices, such as e-cigarettes. If you need help quitting, ask your health care provider. If you will be going home right after the procedure, plan to have a responsible adult: Take you home from the hospital or clinic. You will not be allowed to drive. Care for you for the time you are told. What happens during the procedure? An IV may be inserted into one of your veins. You will be given one or more of the following: A medicine to help you relax (sedative). A medicine that is injected into your urethra to numb the area (local anesthetic). A medicine to make you fall asleep (general anesthetic). A cystoscope will be inserted into your penis and moved through your urethra to your prostate. A device will be inserted through the cystoscope and used to press the lobes of your prostate away from your  urethra. Implants will be inserted through the device to hold the lobes of your prostate in the widened position. The device and cystoscope will be removed. The procedure may vary among health care providers and hospitals. What happens after the procedure? Your blood pressure, heart rate, breathing rate, and blood oxygen level be monitored until you leave the hospital or clinic. If you were given a sedative during the procedure, it can affect you for several hours. Do not drive or operate machinery until your health care provider says that it is safe. Summary Prostatic urethral lift is a surgical procedure to relieve symptoms of prostate gland enlargement that occurs with age (benign prostatic hypertrophy, BPH). The procedure is performed with a thin device called a cystoscope. This device is inserted through the tip of the penis and moved up the urethra to reach the prostate. This is less invasive than other procedures that require an incision. If you will be going home right after the procedure, plan to have a responsible adult take you home from the hospital or clinic.  You will not be allowed to drive. This information is not intended to replace advice given to you by your health care provider. Make sure you discuss any questions you have with your health care provider. Document Revised: 02/03/2021 Document Reviewed: 02/03/2021 Elsevier Patient Education  [place].  Holmium Laser Enucleation of the Prostate (HoLEP)  HoLEP is a treatment for men with benign prostatic hyperplasia (BPH). The laser surgery removed blockages of urine flow, and is done without any incisions on the body.     What is HoLEP?  HoLEP is a type of laser surgery used to treat obstruction (blockage) of urine flow as a result of benign prostatic hyperplasia (BPH). In men with BPH, the prostate gland is not cancerous, but has become enlarged. An enlarged prostate can result in a number of urinary tract symptoms  such as weak urinary stream, difficulty in starting urination, inability to urinate, frequent urination, or getting up at night to urinate.  HoLEP was developed in the 1990's as a more effective and less expensive surgical option for BPH, compared to other surgical options such as laser vaporization(PVP/greenlight laser), transurethral resection of the prostate(TURP), and open simple prostatectomy.   What happens during a HoLEP?  HoLEP requires general anesthesia ("asleep" throughout the procedure).   An antibiotic is given to reduce the risk of infection  A surgical instrument called a resectoscope is inserted through the urethra (the tube that carries urine from the bladder). The resectoscope has a camera that allows the surgeon to view the internal structure of the prostate gland, and to see where the incisions are being made during surgery.  The laser is inserted into the resectoscope and is used to enucleate (free up) the enlarged prostate tissue from the capsule (outer shell) and then to seal up any blood vessels. The tissue that has been removed is pushed back into the bladder.  A morcellator is placed through the resectoscope, and is used to suction out the prostate tissue that has been pushed into the bladder.  When the prostate tissue has been removed, the resectoscope is removed, and a foley catheter is placed to allow healing and drain the urine from the bladder.     What happens after a HoLEP?  More than 90% of patients go home the same day a few hours after surgery. Less than 10% will be admitted to the hospital overnight for observation to monitor the urine, or if they have other medical problems.  Fluid is flushed through the catheter for about 1 hour after surgery to clear any blood from the urine. It is normal to have some blood in the urine after surgery. The need for blood transfusion is extremely rare.  Eating and drinking are permitted after the procedure once the  patient has fully awakened from anesthesia.  The catheter is usually removed 2-3 days after surgery- the patient will come to clinic to have the catheter removed and make sure they can urinate on their own.  It is very important to drink lots of fluids after surgery for one week to keep the bladder flushed.  At first, there may be some burning with urination, but this typically improved within a few hours to days. Most patients do not have a significant amount of pain, and narcotic pain medications are rarely needed.  Symptoms of urinary frequency, urgency, and even leakage are NORMAL for the first few weeks after surgery as the bladder adjusts after having to work hard against blockage from the prostate  for many years. This will improve, but can sometimes take several months.  The use of pelvic floor exercises (Kegel exercises) can help improve problems with urinary incontinence.   After catheter removal, patients will be seen at 6 weeks and 6 months for symptom check  No heavy lifting for at least 2-3 weeks after surgery, however patients can walk and do light activities the first day after surgery. Return to work time depends on occupation.    What are the advantages of HoLEP?  HoLEP has been studied in many different parts of the world and has been shown to be a safe and effective procedure. Although there are many types of BPH surgeries available, HoLEP offers a unique advantage in being able to remove a large amount of tissue without any incisions on the body, even in very large prostates, while decreasing the risk of bleeding and providing tissue for pathology (to look for cancer). This decreases the need for blood transfusions during surgery, minimizes hospital stay, and reduces the risk of needing repeat treatment.  What are the side effects of HoLEP?  Temporary burning and bleeding during urination. Some blood may be seen in the urine for weeks after surgery and is part of the  healing process.  Urinary incontinence (inability to control urine flow) is expected in all patients immediately after surgery and they should wear pads for the first few days/weeks. This typically improves over the course of several weeks. Performing Kegel exercises can help decrease leakage from stress maneuvers such as coughing, sneezing, or lifting. The rate of long term leakage is very low. Patients may also have leakage with urgency and this may be treated with medication. The risk of urge incontinence can be dependent on several factors including age, prostate size, symptoms, and other medical problems.  Retrograde ejaculation or "backwards ejaculation." In 75% of cases, the patient will not see any fluid during ejaculation after surgery.  Erectile function is generally not significantly affected.   What are the risks of HoLEP?  Injury to the urethra or development of scar tissue at a later date  Injury to the capsule of the prostate (typically treated with longer catheterization).  Injury to the bladder or ureteral orifices (where the urine from the kidney drains out)  Infection of the bladder, testes, or kidneys  Return of urinary obstruction at a later date requiring another operation (<2%)  Need for blood transfusion or re-operation due to bleeding  Failure to relieve all symptoms and/or need for prolonged catheterization after surgery  5-15% of patients are found to have previously undiagnosed prostate cancer in their specimen. Prostate cancer can be treated after HoLEP.  Standard risks of anesthesia including blood clots, heart attacks, etc  When should I call my doctor?  Fever over 101.3 degrees  Inability to urinate, or large blood clots in the urine

## 2022-02-21 ENCOUNTER — Encounter: Payer: Self-pay | Admitting: Urology

## 2022-02-21 ENCOUNTER — Ambulatory Visit (INDEPENDENT_AMBULATORY_CARE_PROVIDER_SITE_OTHER): Payer: Medicare Other | Admitting: Urology

## 2022-02-21 VITALS — BP 172/94 | HR 78 | Ht 67.5 in | Wt 174.0 lb

## 2022-02-21 DIAGNOSIS — N138 Other obstructive and reflux uropathy: Secondary | ICD-10-CM | POA: Diagnosis not present

## 2022-02-21 DIAGNOSIS — R972 Elevated prostate specific antigen [PSA]: Secondary | ICD-10-CM

## 2022-02-21 DIAGNOSIS — N401 Enlarged prostate with lower urinary tract symptoms: Secondary | ICD-10-CM | POA: Diagnosis not present

## 2022-02-21 LAB — MICROSCOPIC EXAMINATION: Bacteria, UA: NONE SEEN

## 2022-02-21 LAB — URINALYSIS, COMPLETE
Bilirubin, UA: NEGATIVE
Glucose, UA: NEGATIVE
Ketones, UA: NEGATIVE
Leukocytes,UA: NEGATIVE
Nitrite, UA: NEGATIVE
Protein,UA: NEGATIVE
RBC, UA: NEGATIVE
Specific Gravity, UA: 1.02 (ref 1.005–1.030)
Urobilinogen, Ur: 0.2 mg/dL (ref 0.2–1.0)
pH, UA: 5 (ref 5.0–7.5)

## 2022-02-21 MED ORDER — LIDOCAINE HCL URETHRAL/MUCOSAL 2 % EX GEL
1.0000 | Freq: Once | CUTANEOUS | Status: AC
  Administered 2022-02-21: 1 via URETHRAL

## 2022-02-21 NOTE — Patient Instructions (Signed)

## 2022-02-21 NOTE — Progress Notes (Signed)
Cystoscopy Procedure Note:  Indication: Worsening lower urinary tract symptoms.  Also has a long history of mildly elevated PSA ranging from 5-9 over the last few years including a negative prostate biopsy in 2018 with Dr. Pilar Spence for PSA of 6.6 which showed a 50 g prostate with inflammation but no malignancy, 4K score previously showed an 11% risk of clinically significant prostate cancer.  I have offered prostate MRI and repeat biopsy multiple times, and he is opted to continue to trend the PSA.  PSA has overall been stable.  After informed consent and discussion of the procedure and its risks, Scott Spence was positioned and prepped in the standard fashion. Cystoscopy was performed with a flexible cystoscope. The urethra, bladder neck and entire bladder was visualized in a standard fashion. The prostate was large with obstructing lateral lobes and a moderate-sized median lobe with intravesical protrusion. The ureteral orifices were visualized in their normal location and orientation.  No suspicious bladder lesions, intravesical protrusion of the prostate on retroflexion.  Imaging: The transrectal ultrasound probe was inserted into the rectum and measurements taken for a calculated prostate volume of 84g.  Reassuring PSA density of 0.10  Findings: Large prostate with median lobe  ---------------------------------------------------------------------------------------------  Assessment and Plan: 71 year old male with long history of elevated PSA ranging from 5-9 over the last few years, including a negative prostate biopsy in 2018 with Dr. Pilar Spence, 4K score showing 11% risk of clinically significant prostate cancer.  We have discussed prostate MRI or repeat biopsy previously, and he opted to continue to trend the PSA.  He has worsening urinary symptoms despite max dose Flomax nightly, and noticed significant worsening in the urination if he stops the Flomax.  Cystoscopy today shows a large obstructive  prostate with a moderate-sized median lobe, and prostate volume 84 g.  We will reviewed the reassuring PSA density of 0.1, and that his symptoms are most likely secondary to BPH with outlet obstruction.  Based on his prostate volume, I think he would benefit most from HOLEP.  We reviewed alternatives to surgery including continuing Flomax or addition of finasteride.  We discussed the risks and benefits of HoLEP at length.  The procedure requires general anesthesia and takes 1 to 2 hours, and a holmium laser is used to enucleate the prostate and push this tissue into the bladder.  A morcellator is then used to remove this tissue, which is sent for pathology.  The vast majority(>95%) of patients are able to discharge the same day with a catheter in place for 2 to 3 days, and will follow-up in clinic for a voiding trial.  We specifically discussed the risks of bleeding, infection, retrograde ejaculation, temporary urgency and urge incontinence, very low risk of long-term incontinence, urethral stricture/bladder neck contracture, pathologic evaluation of prostate tissue and possible detection of prostate cancer or other malignancy, and possible need for additional procedures.  He is leaning toward HOLEP, and will call to schedule  Scott Madrid, MD 02/21/2022

## 2022-02-27 ENCOUNTER — Telehealth: Payer: Self-pay

## 2022-02-27 NOTE — Telephone Encounter (Signed)
Incoming call on triage line from pt looking to schedule Holep w/ Dr Diamantina Providence. Advised pt surgery coordinator would return his call.

## 2022-02-28 ENCOUNTER — Other Ambulatory Visit: Payer: Medicare Other

## 2022-02-28 ENCOUNTER — Other Ambulatory Visit: Payer: Self-pay | Admitting: Urology

## 2022-02-28 ENCOUNTER — Other Ambulatory Visit: Payer: Self-pay

## 2022-02-28 ENCOUNTER — Telehealth: Payer: Self-pay

## 2022-02-28 DIAGNOSIS — N138 Other obstructive and reflux uropathy: Secondary | ICD-10-CM

## 2022-02-28 LAB — URINALYSIS, COMPLETE
Bilirubin, UA: NEGATIVE
Glucose, UA: NEGATIVE
Leukocytes,UA: NEGATIVE
Nitrite, UA: NEGATIVE
RBC, UA: NEGATIVE
Specific Gravity, UA: 1.025 (ref 1.005–1.030)
Urobilinogen, Ur: 0.2 mg/dL (ref 0.2–1.0)
pH, UA: 5 (ref 5.0–7.5)

## 2022-02-28 LAB — MICROSCOPIC EXAMINATION

## 2022-02-28 NOTE — Telephone Encounter (Signed)
I spoke with Scott Spence. We have discussed possible surgery dates and Friday August 18th, 2023 was agreed upon by all parties. Patient given information about surgery date, what to expect pre-operatively and post operatively.  We discussed that a Pre-Admission Testing office will be calling to set up the pre-op visit that will take place prior to surgery, and that these appointments are typically done over the phone with a Pre-Admissions RN.  Informed patient that our office will communicate any additional care to be provided after surgery. Patients questions or concerns were discussed during our call. Advised to call our office should there be any additional information, questions or concerns that arise. Patient verbalized understanding.

## 2022-02-28 NOTE — Progress Notes (Signed)
[facility] Posting Form   Surgery Date/Time: Date: 03/09/2022  Surgeon: Dr. 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

## 2022-02-28 NOTE — Progress Notes (Signed)
Surgical Physician Order Form [facility]  * Scheduling expectation : Next Available, patient preference  *Length of Case: 1.5 hours  *Clearance needed: no  *Anticoagulation Instructions: Hold all anticoagulants  *Aspirin Instructions: Hold Aspirin and Plavix  *Post-op visit Date/Instructions:  1-3 day cath removal  *Diagnosis: BPH w/urinary obstruction  *Procedure:     HOLEP (15973)   Additional orders: N/A  -Admit type: OUTpatient  -Anesthesia: General  -VTE Prophylaxis Standing Order SCD's       Other:   -Standing Lab Orders Per Anesthesia    Lab other: UA&Urine Culture  -Standing Test orders EKG/Chest x-ray per Anesthesia       Test other:   - Medications:  Ancef 2gm IV  -Other orders:  N/A

## 2022-03-03 LAB — CULTURE, URINE COMPREHENSIVE

## 2022-03-05 ENCOUNTER — Other Ambulatory Visit: Payer: Self-pay

## 2022-03-05 ENCOUNTER — Encounter
Admission: RE | Admit: 2022-03-05 | Discharge: 2022-03-05 | Disposition: A | Discharge status: Home / Self Care | Payer: Medicare Other | Source: Ambulatory Visit | Attending: Urology | Admitting: Urology

## 2022-03-05 DIAGNOSIS — I10 Essential (primary) hypertension: Secondary | ICD-10-CM

## 2022-03-05 NOTE — Patient Instructions (Signed)
Your procedure is scheduled on: 03/09/2022 Report to the Registration Desk on the 1st floor of the [facility]. To find out your arrival time, please call [phone] between 1PM - 3PM on: 03/08/2022  If your arrival time is 6:00 am, do not arrive prior to that time as the [facility] entrance doors do not open until 6:00 am.  REMEMBER: Instructions that are not followed completely may result in serious medical risk, up to and including death; or upon the discretion of your surgeon and anesthesiologist your surgery may need to be rescheduled.  Do not eat food after midnight the night before surgery.  No gum chewing, lozengers or hard candies.    TAKE THESE MEDICATIONS THE MORNING OF SURGERY WITH A SIP OF WATER:  Loratadine Omeprazole-(take one the night before and one on the morning of surgery - helps to prevent nausea after surgery.) Rosuvastatin tamsulosin    Follow recommendations from Cardiologist, Pulmonologist or PCP regarding stopping Aspirin, Coumadin, Plavix, Eliquis, Pradaxa, or Pletal.  One week prior to surgery: Stop Anti-inflammatories (NSAIDS) such as Advil, Aleve, Ibuprofen, Motrin, Naproxen, Naprosyn and Aspirin based products such as Excedrin, Goodys Powder, BC Powder. Stop ANY OVER THE COUNTER supplements until after surgery. calcium gluconate, Coenzyme, Cyanocobalamin, Glucosamine-Chondroit-Vit , Magnesium, Multiple Vitamin  You may however, continue to take Tylenol if needed for pain up until the day of surgery.  No Alcohol for 24 hours before or after surgery.  No Smoking including e-cigarettes for 24 hours prior to surgery.  No chewable tobacco products for at least 6 hours prior to surgery.  No nicotine patches on the day of surgery.  Do not use any "recreational" drugs for at least a week prior to your surgery.  Please be advised that the combination of cocaine and anesthesia may have negative outcomes, up to and including death. If you test positive  for cocaine, your surgery will be cancelled.  On the morning of surgery brush your teeth with toothpaste and water, you may rinse your mouth with mouthwash if you wish. Do not swallow any toothpaste or mouthwash.  You need to shower on day of surgery   Do not wear jewelry, make-up, hairpins, clips or nail polish.  Do not wear lotions, powders, or perfumes.   Do not shave body from the neck down 48 hours prior to surgery just in case you cut yourself which could leave a site for infection.  Also, freshly shaved skin may become irritated if using the CHG soap.  Contact lenses, hearing aids and dentures may not be worn into surgery.  Do not bring valuables to the hospital. [facility] is not responsible for any missing/lost belongings or valuables.      Notify your doctor if there is any change in your medical condition (cold, fever, infection).  Wear comfortable clothing (specific to your surgery type) to the hospital.  After surgery, you can help prevent lung complications by doing breathing exercises.  Take deep breaths and cough every 1-2 hours. Your doctor may order a device called an Incentive Spirometer to help you take deep breaths. .  If you are being admitted to the hospital overnight, leave your suitcase in the car. After surgery it may be brought to your room.  If you are being discharged the day of surgery, you will not be allowed to drive home. You will need a responsible adult (18 years or older) to drive you home and stay with you that night.   If you are taking  public transportation, you will need to have a responsible adult (18 years or older) with you. Please confirm with your physician that it is acceptable to use public transportation.   Please call the [facility] Dept. at [phone] if you have any questions about these instructions.  Surgery Visitation Policy:  Patients undergoing a surgery or procedure may have two family members or  support persons with them as long as the person is not COVID-19 positive or experiencing its symptoms.   Inpatient Visitation:    Visiting hours are 7 a.m. to 8 p.m. Up to four visitors are allowed at one time in a patient room, including children. The visitors may rotate out with other people during the day. One designated support person (adult) may remain overnight.

## 2022-03-06 ENCOUNTER — Encounter
Admission: RE | Admit: 2022-03-06 | Discharge: 2022-03-06 | Disposition: A | Discharge status: Home / Self Care | Payer: Medicare Other | Source: Ambulatory Visit | Attending: Urology | Admitting: Urology

## 2022-03-06 DIAGNOSIS — I10 Essential (primary) hypertension: Secondary | ICD-10-CM | POA: Diagnosis not present

## 2022-03-06 DIAGNOSIS — Z01810 Encounter for preprocedural cardiovascular examination: Secondary | ICD-10-CM | POA: Diagnosis present

## 2022-03-08 MED ORDER — CHLORHEXIDINE GLUCONATE 0.12 % MT SOLN: 15.0000 mL | Freq: Once | OROMUCOSAL | Status: AC

## 2022-03-08 MED ORDER — LACTATED RINGERS IV SOLN: INTRAVENOUS | Status: DC

## 2022-03-08 MED ORDER — CEFAZOLIN SODIUM-DEXTROSE 2-4 GM/100ML-% IV SOLN
2.0000 g | INTRAVENOUS | Status: AC
  Administered 2022-03-09: 2 g via INTRAVENOUS

## 2022-03-08 MED ORDER — ORAL CARE MOUTH RINSE: 15.0000 mL | Freq: Once | OROMUCOSAL | Status: AC

## 2022-03-09 ENCOUNTER — Ambulatory Visit
Admission: RE | Admit: 2022-03-09 | Discharge: 2022-03-09 | Disposition: A | Discharge status: Home / Self Care | Payer: Medicare Other | Attending: Urology | Admitting: Urology

## 2022-03-09 ENCOUNTER — Other Ambulatory Visit: Payer: Self-pay

## 2022-03-09 ENCOUNTER — Encounter: Payer: Self-pay | Admitting: Urology

## 2022-03-09 ENCOUNTER — Ambulatory Visit: Payer: Medicare Other | Admitting: Urgent Care

## 2022-03-09 ENCOUNTER — Encounter
Admission: RE | Disposition: A | Discharge status: Home / Self Care | Payer: Self-pay | Source: Home / Self Care | Attending: Urology

## 2022-03-09 DIAGNOSIS — N401 Enlarged prostate with lower urinary tract symptoms: Secondary | ICD-10-CM | POA: Insufficient documentation

## 2022-03-09 DIAGNOSIS — N138 Other obstructive and reflux uropathy: Secondary | ICD-10-CM | POA: Insufficient documentation

## 2022-03-09 DIAGNOSIS — I129 Hypertensive chronic kidney disease with stage 1 through stage 4 chronic kidney disease, or unspecified chronic kidney disease: Secondary | ICD-10-CM | POA: Insufficient documentation

## 2022-03-09 DIAGNOSIS — N189 Chronic kidney disease, unspecified: Secondary | ICD-10-CM | POA: Insufficient documentation

## 2022-03-09 DIAGNOSIS — K219 Gastro-esophageal reflux disease without esophagitis: Secondary | ICD-10-CM | POA: Insufficient documentation

## 2022-03-09 DIAGNOSIS — C61 Malignant neoplasm of prostate: Secondary | ICD-10-CM | POA: Diagnosis not present

## 2022-03-09 HISTORY — PX: HOLEP-LASER ENUCLEATION OF THE PROSTATE WITH MORCELLATION: SHX6641

## 2022-03-09 SURGERY — ENUCLEATION, PROSTATE, USING LASER, WITH MORCELLATION
Anesthesia: General | Site: Prostate

## 2022-03-09 MED ORDER — CEFAZOLIN SODIUM-DEXTROSE 2-4 GM/100ML-% IV SOLN
INTRAVENOUS | Status: AC
  Filled 2022-03-09: qty 100

## 2022-03-09 MED ORDER — ROCURONIUM BROMIDE 100 MG/10ML IV SOLN
INTRAVENOUS | Status: DC | PRN
  Administered 2022-03-09: 20 mg via INTRAVENOUS
  Administered 2022-03-09: 50 mg via INTRAVENOUS
  Administered 2022-03-09: 10 mg via INTRAVENOUS

## 2022-03-09 MED ORDER — SODIUM CHLORIDE 0.9 % IR SOLN
Status: DC | PRN
  Administered 2022-03-09 (×11): 3000 mL via INTRAVESICAL

## 2022-03-09 MED ORDER — PROPOFOL 10 MG/ML IV BOLUS
INTRAVENOUS | Status: AC
  Filled 2022-03-09: qty 20

## 2022-03-09 MED ORDER — OXYCODONE HCL 5 MG PO TABS
ORAL_TABLET | ORAL | Status: AC
  Administered 2022-03-09: 5 mg via ORAL
  Filled 2022-03-09: qty 1

## 2022-03-09 MED ORDER — ACETAMINOPHEN 10 MG/ML IV SOLN: 1000.0000 mg | Freq: Once | INTRAVENOUS | Status: DC | PRN

## 2022-03-09 MED ORDER — DEXMEDETOMIDINE (PRECEDEX) IN NS 20 MCG/5ML (4 MCG/ML) IV SYRINGE
PREFILLED_SYRINGE | INTRAVENOUS | Status: DC | PRN
  Administered 2022-03-09 (×2): 4 ug via INTRAVENOUS

## 2022-03-09 MED ORDER — ONDANSETRON HCL 4 MG/2ML IJ SOLN: 4.0000 mg | Freq: Once | INTRAMUSCULAR | Status: DC | PRN

## 2022-03-09 MED ORDER — LACTATED RINGERS IV SOLN: INTRAVENOUS | Status: DC

## 2022-03-09 MED ORDER — PROPOFOL 10 MG/ML IV BOLUS
INTRAVENOUS | Status: DC | PRN
  Administered 2022-03-09: 150 mg via INTRAVENOUS

## 2022-03-09 MED ORDER — LIDOCAINE HCL (CARDIAC) PF 100 MG/5ML IV SOSY
PREFILLED_SYRINGE | INTRAVENOUS | Status: DC | PRN
  Administered 2022-03-09: 80 mg via INTRAVENOUS

## 2022-03-09 MED ORDER — SUGAMMADEX SODIUM 200 MG/2ML IV SOLN
INTRAVENOUS | Status: DC | PRN
  Administered 2022-03-09: 200 mg via INTRAVENOUS

## 2022-03-09 MED ORDER — CHLORHEXIDINE GLUCONATE 0.12 % MT SOLN
OROMUCOSAL | Status: AC
  Administered 2022-03-09: 15 mL via OROMUCOSAL
  Filled 2022-03-09: qty 15

## 2022-03-09 MED ORDER — OXYCODONE HCL 5 MG/5ML PO SOLN: 5.0000 mg | Freq: Once | ORAL | Status: AC | PRN

## 2022-03-09 MED ORDER — FENTANYL CITRATE (PF) 100 MCG/2ML IJ SOLN
INTRAMUSCULAR | Status: AC
  Filled 2022-03-09: qty 2

## 2022-03-09 MED ORDER — OXYCODONE HCL 5 MG PO TABS: 5.0000 mg | ORAL_TABLET | Freq: Once | ORAL | Status: AC | PRN

## 2022-03-09 MED ORDER — FENTANYL CITRATE (PF) 100 MCG/2ML IJ SOLN
INTRAMUSCULAR | Status: DC | PRN
  Administered 2022-03-09 (×2): 50 ug via INTRAVENOUS

## 2022-03-09 MED ORDER — DEXAMETHASONE SODIUM PHOSPHATE 10 MG/ML IJ SOLN
INTRAMUSCULAR | Status: DC | PRN
  Administered 2022-03-09: 5 mg via INTRAVENOUS

## 2022-03-09 MED ORDER — PHENYLEPHRINE HCL (PRESSORS) 10 MG/ML IV SOLN
INTRAVENOUS | Status: DC | PRN
  Administered 2022-03-09: 80 ug via INTRAVENOUS
  Administered 2022-03-09: 160 ug via INTRAVENOUS

## 2022-03-09 MED ORDER — EPHEDRINE SULFATE (PRESSORS) 50 MG/ML IJ SOLN
INTRAMUSCULAR | Status: DC | PRN
  Administered 2022-03-09: 5 mg via INTRAVENOUS
  Administered 2022-03-09: 10 mg via INTRAVENOUS
  Administered 2022-03-09: 5 mg via INTRAVENOUS

## 2022-03-09 MED ORDER — FENTANYL CITRATE (PF) 100 MCG/2ML IJ SOLN: 25.0000 ug | INTRAMUSCULAR | Status: DC | PRN

## 2022-03-09 MED ORDER — ONDANSETRON HCL 4 MG/2ML IJ SOLN
INTRAMUSCULAR | Status: DC | PRN
  Administered 2022-03-09: 4 mg via INTRAVENOUS

## 2022-03-09 MED ORDER — HYDROCODONE-ACETAMINOPHEN 5-325 MG PO TABS: 1.0000 | ORAL_TABLET | Freq: Four times a day (QID) | ORAL | 0 refills | Status: AC | PRN

## 2022-03-09 SURGICAL SUPPLY — 35 items
ADAPTER IRRIG TUBE 2 SPIKE SOL (ADAPTER) ×2 IMPLANT
ADPR TBG 2 SPK PMP STRL ASCP (ADAPTER) ×3
BAG DRN LRG CPC RND TRDRP CNTR (MISCELLANEOUS) ×1
BAG URO DRAIN 4000ML (MISCELLANEOUS) ×1 IMPLANT
CATH FOLEY 3WAY 30CC 24FR (CATHETERS) ×1
CATH URETL OPEN END 4X70 (CATHETERS) ×1 IMPLANT
CATH URTH STD 24FR FL 3W 2 (CATHETERS) ×1 IMPLANT
CONTAINER COLLECT MORCELLATR (MISCELLANEOUS) ×1 IMPLANT
DRAPE UTILITY 15X26 TOWEL STRL (DRAPES) IMPLANT
ELECT BIVAP BIPO 22/24 DONUT (ELECTROSURGICAL)
ELECTRD BIVAP BIPO 22/24 DONUT (ELECTROSURGICAL) IMPLANT
FIBER LASER MOSES 550 DFL (Laser) ×1 IMPLANT
FILTER OVERFLOW MORCELLATOR (FILTER) ×1 IMPLANT
GLOVE SURG UNDER POLY LF SZ7.5 (GLOVE) ×1 IMPLANT
GOWN STRL REUS W/ TWL LRG LVL3 (GOWN DISPOSABLE) ×1 IMPLANT
GOWN STRL REUS W/ TWL XL LVL3 (GOWN DISPOSABLE) ×1 IMPLANT
GOWN STRL REUS W/TWL LRG LVL3 (GOWN DISPOSABLE) ×1
GOWN STRL REUS W/TWL XL LVL3 (GOWN DISPOSABLE) ×1
HOLDER FOLEY CATH W/STRAP (MISCELLANEOUS) ×1 IMPLANT
IV NS IRRIG 3000ML ARTHROMATIC (IV SOLUTION) ×5 IMPLANT
KIT TURNOVER CYSTO (KITS) ×1 IMPLANT
MBRN O SEALING YLW 17 FOR INST (MISCELLANEOUS) ×1
MEMBRANE SLNG YLW 17 FOR INST (MISCELLANEOUS) ×1 IMPLANT
MORCELLATOR COLLECT CONTAINER (MISCELLANEOUS) ×1
MORCELLATOR OVERFLOW FILTER (FILTER) ×1
MORCELLATOR ROTATION 4.75 335 (MISCELLANEOUS) ×1 IMPLANT
PACK CYSTO AR (MISCELLANEOUS) ×1 IMPLANT
SET CYSTO W/LG BORE CLAMP LF (SET/KITS/TRAYS/PACK) ×1 IMPLANT
SET IRRIG Y TYPE TUR BLADDER L (SET/KITS/TRAYS/PACK) ×1 IMPLANT
SLEEVE PROTECTION STRL DISP (MISCELLANEOUS) ×2 IMPLANT
SURGILUBE 2OZ TUBE FLIPTOP (MISCELLANEOUS) ×1 IMPLANT
SYR TOOMEY IRRIG 70ML (MISCELLANEOUS) ×1
SYRINGE TOOMEY IRRIG 70ML (MISCELLANEOUS) ×1 IMPLANT
TUBE PUMP MORCELLATOR PIRANHA (TUBING) ×1 IMPLANT
WATER STERILE IRR 1000ML POUR (IV SOLUTION) ×1 IMPLANT

## 2022-03-09 NOTE — Transfer of Care (Signed)
Immediate Anesthesia Transfer of Care Note  Patient: Scott Spence  Procedure(s) Performed: HOLEP-LASER ENUCLEATION OF THE PROSTATE WITH MORCELLATION (Prostate)  Patient Location: PACU  Anesthesia Type:General  Level of Consciousness: drowsy  Airway & Oxygen Therapy: Patient Spontanous Breathing and Patient connected to face mask oxygen  Post-op Assessment: Report given to RN  Post vital signs: stable  Last Vitals:  Vitals Value Taken Time  BP    Temp    Pulse 77 03/09/22 0846  Resp 22 03/09/22 0846  SpO2 97 % 03/09/22 0846  Vitals shown include unvalidated device data.  Last Pain:  Vitals:   03/09/22 0617  TempSrc: Tympanic         Complications: No notable events documented.

## 2022-03-09 NOTE — Anesthesia Procedure Notes (Signed)
Procedure Name: Intubation Date/Time: 03/09/2022 7:35 AM  Performed by: Lerry Liner, CRNAPre-anesthesia Checklist: Patient identified, Emergency Drugs available, Suction available and Patient being monitored Patient Re-evaluated:Patient Re-evaluated prior to induction Oxygen Delivery Method: Circle system utilized Preoxygenation: Pre-oxygenation with 100% oxygen Induction Type: IV induction Ventilation: Mask ventilation without difficulty Laryngoscope Size: McGraph and 4 Grade View: Grade I Tube type: Oral Tube size: 7.5 mm Number of attempts: 1 Airway Equipment and Method: Stylet and Oral airway Placement Confirmation: ETT inserted through vocal cords under direct vision, positive ETCO2 and breath sounds checked- equal and bilateral Secured at: 22 cm Tube secured with: Tape Dental Injury: Teeth and Oropharynx as per pre-operative assessment

## 2022-03-09 NOTE — Discharge Instructions (Signed)
AMBULATORY SURGERY  ?DISCHARGE INSTRUCTIONS ? ? ?The drugs that you were given will stay in your system until tomorrow so for the next 24 hours you should not: ? ?Drive an automobile ?Make any legal decisions ?Drink any alcoholic beverage ? ? ?You may resume regular meals tomorrow.  Today it is better to start with liquids and gradually work up to solid foods. ? ?You may eat anything you prefer, but it is better to start with liquids, then soup and crackers, and gradually work up to solid foods. ? ? ?Please notify your doctor immediately if you have any unusual bleeding, trouble breathing, redness and pain at the surgery site, drainage, fever, or pain not relieved by medication. ? ? ? ?Additional Instructions: ? ? ? ?Please contact your physician with any problems or Same Day Surgery at [phone], Monday through Friday 6 am to 4 pm, or [place] [facility] Main number at [phone].  ?

## 2022-03-09 NOTE — Op Note (Signed)
Date of procedure: 03/09/22  Preoperative diagnosis:  BPH with obstruction  Postoperative diagnosis:  Same  Procedure: HoLEP (Holmium Laser Enucleation of the Prostate)  Surgeon: [name], MD  Anesthesia: General  Complications: None  Intraoperative findings:  Moderate bladder trabeculations, large prostate with median lobe, ureteral orifices very close to bladder neck Left ureteral orifice intact, right ureteral orifice very close to resection edge but appeared intact Excellent hemostasis  EBL: Minimal  Specimens: Prostate chips  Enucleation time: 29 minutes  Morcellation time: 6 minutes  Intra-op weight: 40 g+  Drains: 24 French three-way, 60 cc in balloon  Indication: Scott Spence is a 71 y.o. patient with BPH and obstructive symptoms refractory to max dose Flomax who opted for surgical intervention.  After reviewing the management options for treatment, they elected to proceed with the above surgical procedure(s). We have discussed the potential benefits and risks of the procedure, side effects of the proposed treatment, the likelihood of the patient achieving the goals of the procedure, and any potential problems that might occur during the procedure or recuperation.  We specifically discussed the risks of bleeding, infection, hematuria and clot retention, need for additional procedures, possible overnight hospital stay, temporary urgency and incontinence, rare long-term incontinence, and retrograde ejaculation.  Informed consent has been obtained.   Description of procedure:  The patient was taken to the operating room and general anesthesia was induced.  The patient was placed in the dorsal lithotomy position, prepped and draped in the usual sterile fashion, and preoperative antibiotics(Ancef) were administered.  SCDs were placed for DVT prophylaxis.  A preoperative time-out was performed.   Scott Spence sounds were used to gently dilated the urethra up to 57F. The 81  French continuous flow resectoscope was inserted into the urethra using the visual obturator  The prostate was large with obstructing lateral lobes and a moderate sized median lobe. The bladder was thoroughly inspected and notable for moderate trabeculations.  The ureteral orifices were located in orthotopic position, and extremely close to the bladder neck.  The laser was set to 2 J and 60 Hz and was used to make a lambda incision just proximal to the verumontanum down to the level of the capsule. Early apical release was performed by making a circumferential mucosal incision proximal to the sphincter. A 5-7 o'clock incisions were then made down to the level of the capsule from the bladder neck to the verumontanum, and the median lobe enucleated into the bladder.  The lateral lobes were then incised circumferentially until they were disconnected from the surrounding tissue.  The capsule was examined and laser was used for meticulous hemostasis.  The right ureteral orifice was extremely close to the bladder neck and edge of resection, but appeared intact.  The 32 French resectoscope was then switched out for the 53 French nephroscope and the lobes were morcellated and the tissue sent to pathology.  A 24 French three-way catheter was inserted easily with the aid of a catheter guide, and 60 cc were placed in the balloon.  Urine was faint pink.  The catheter irrigated easily with a Toomey syringe.  CBI was initiated.   The patient tolerated the procedure well without any immediate complications and was extubated and transferred to the recovery room in stable condition.  Urine was clear on fast CBI.  Disposition: Stable to PACU  Plan: Wean CBI in PACU, anticipate discharge home today with Foley removal in clinic in 2-3 days  [name], MD 03/09/2022

## 2022-03-09 NOTE — Anesthesia Preprocedure Evaluation (Addendum)
Anesthesia Evaluation  Patient identified by MRN, date of birth, ID band Patient awake    Reviewed: Allergy & Precautions, NPO status , Patient's Chart, lab work & pertinent test results  History of Anesthesia Complications (+) PROLONGED EMERGENCE and history of anesthetic complications  Airway Mallampati: IV   Neck ROM: Full    Dental  (+) Chipped   Pulmonary neg pulmonary ROS,    Pulmonary exam normal breath sounds clear to auscultation       Cardiovascular hypertension, Normal cardiovascular exam Rhythm:Regular Rate:Normal  ECG 03/06/22:  Normal sinus rhythm Septal infarct (cited on or before 17-Mar-2018) Abnormal ECG When compared with ECG of 17-Mar-2018 13:29, No significant change was found   Neuro/Psych negative neurological ROS     GI/Hepatic GERD  ,  Endo/Other  negative endocrine ROS  Renal/GU Renal disease (CKD; nephrolithiasis)   BPH    Musculoskeletal Gout    Abdominal   Peds  Hematology negative hematology ROS (+)   Anesthesia Other Findings   Reproductive/Obstetrics                            Anesthesia Physical Anesthesia Plan  ASA: 2  Anesthesia Plan: General   Post-op Pain Management:    Induction: Intravenous  PONV Risk Score and Plan: 2 and Ondansetron, Dexamethasone and Treatment may vary due to age or medical condition  Airway Management Planned: Oral ETT  Additional Equipment:   Intra-op Plan:   Post-operative Plan: Extubation in OR  Informed Consent: I have reviewed the patients History and Physical, chart, labs and discussed the procedure including the risks, benefits and alternatives for the proposed anesthesia with the patient or authorized representative who has indicated his/her understanding and acceptance.     Dental advisory given  Plan Discussed with: CRNA  Anesthesia Plan Comments: (Patient consented for risks of anesthesia  including but not limited to:  - adverse reactions to medications - damage to eyes, teeth, lips or other oral mucosa - nerve damage due to positioning  - sore throat or hoarseness - damage to heart, brain, nerves, lungs, other parts of body or loss of life  Informed patient about role of CRNA in peri- and intra-operative care.  Patient voiced understanding.)        Anesthesia Quick Evaluation

## 2022-03-09 NOTE — Anesthesia Postprocedure Evaluation (Signed)
Anesthesia Post Note  Patient: Scott Spence  Procedure(s) Performed: HOLEP-LASER ENUCLEATION OF THE PROSTATE WITH MORCELLATION (Prostate)  Patient location during evaluation: PACU Anesthesia Type: General Level of consciousness: awake and alert, oriented and patient cooperative Pain management: pain level controlled Vital Signs Assessment: post-procedure vital signs reviewed and stable Respiratory status: spontaneous breathing, nonlabored ventilation and respiratory function stable Cardiovascular status: blood pressure returned to baseline and stable Postop Assessment: adequate PO intake Anesthetic complications: no   No notable events documented.   Last Vitals:  Vitals:   03/09/22 0900 03/09/22 0915  BP: 104/74 121/74  Pulse: 72 64  Resp: 16 19  Temp:    SpO2: 91% 92%    Last Pain:  Vitals:   03/09/22 0617  TempSrc: Tympanic                 Darrin Nipper

## 2022-03-09 NOTE — H&P (Signed)
03/09/22 7:09 AM   Scott Spence 08/27/1950 [id number]  CC: BPH  HPI: 71 year old male with long history of elevated PSA ranging from 5-9 over the last few years, including a negative prostate biopsy in 2018 with Dr. Pilar Jarvis, 4K score showing 11% risk of clinically significant prostate cancer.  We have discussed prostate MRI or repeat biopsy previously, and he opted to continue to trend the PSA.   He has worsening urinary symptoms despite max dose Flomax nightly, and noticed significant worsening in the urination if he stops the Flomax.  Cystoscopy shows a large obstructive prostate with a moderate-sized median lobe, and prostate volume 84 g.  We will reviewed the reassuring PSA density of 0.1, and that his symptoms are most likely secondary to BPH with outlet obstruction.  Based on his prostate volume, I think he would benefit most from HOLEP.  We reviewed alternatives to surgery including continuing Flomax or addition of finasteride.   PMH: Past Medical History:  Diagnosis Date   Chronic kidney disease    hemorrhagic nephritis as a child   Complication of anesthesia    collapsed lung with local for LUE surgery   GERD (gastroesophageal reflux disease)    Gout    History of kidney stones    Hypertension     Surgical History: Past Surgical History:  Procedure Laterality Date   APPENDECTOMY     HERNIA REPAIR     KIDNEY STONE SURGERY     TOTAL HIP ARTHROPLASTY Right 04/01/2018   Procedure: TOTAL HIP ARTHROPLASTY ANTERIOR APPROACH;  Surgeon: Hessie Knows, MD;  Location: [facility] ORS;  Service: Orthopedics;  Laterality: Right;     Family History: Family History  Problem Relation Age of Onset   Prostate cancer Neg Hx    Bladder Cancer Neg Hx    Kidney cancer Neg Hx     Social History:  reports that he has never smoked. He has never been exposed to tobacco smoke. He has never used smokeless tobacco. He reports current alcohol use of about 14.0 standard drinks of alcohol per week.  He reports that he does not use drugs.  Physical Exam: BP (!) 149/94   Pulse 65   Temp 97.8 F (36.6 C) (Tympanic)   Ht 5' 7.5" (1.715 m)   Wt 79.4 kg   SpO2 96%   BMI 27.00 kg/m    Constitutional:  Alert and oriented, No acute distress. Cardiovascular: RRR Respiratory: CTA bilaterally GI: Abdomen is soft, nontender, nondistended, no abdominal masses   Laboratory Data: Urine culture 8/9 no growth  Assessment & Plan:   71 year old male with long history of BPH and symptoms refractory to medical management, elevated PSA with negative biopsy, reassuring PSA density who opted for HOLEP.  We discussed the risks and benefits of HoLEP at length.  The procedure requires general anesthesia and takes 1 to 2 hours, and a holmium laser is used to enucleate the prostate and push this tissue into the bladder.  A morcellator is then used to remove this tissue, which is sent for pathology.  The vast majority(>95%) of patients are able to discharge the same day with a catheter in place for 2 to 3 days, and will follow-up in clinic for a voiding trial.  We specifically discussed the risks of bleeding, infection, retrograde ejaculation, temporary urgency and urge incontinence, very low risk of long-term incontinence, urethral stricture/bladder neck contracture, pathologic evaluation of prostate tissue and possible detection of prostate cancer or other malignancy, and possible need for additional  procedures.  Schedule HOLEP  [name], MD 03/09/2022  [facility] [address] [place], [place] [address] [phone]

## 2022-03-12 ENCOUNTER — Ambulatory Visit (INDEPENDENT_AMBULATORY_CARE_PROVIDER_SITE_OTHER): Payer: Medicare Other | Admitting: Physician Assistant

## 2022-03-12 DIAGNOSIS — N401 Enlarged prostate with lower urinary tract symptoms: Secondary | ICD-10-CM

## 2022-03-12 DIAGNOSIS — N138 Other obstructive and reflux uropathy: Secondary | ICD-10-CM

## 2022-03-12 NOTE — Progress Notes (Signed)
Catheter Removal  Patient is present today for a catheter removal.  55m of water was drained from the balloon. A 24FR three-way foley cath was removed from the bladder no complications were noted . Patient tolerated well.  Performed by: SDebroah Loop PA-C   Additional notes: Counseled patient on normal postoperative findings including dysuria, gross hematuria, and urinary urgency/leakage. Counseled patient to begin Kegel exercises 3x10 sets daily to increase urinary control and wear absorbent products as needed for security. Written and verbal resources provided today. Surgical pathology pending; will defer to Dr. SDiamantina Providenceto share results when available.   Follow up: 12 week postop f/u with Dr. SDiamantina Providence

## 2022-03-12 NOTE — Patient Instructions (Signed)

## 2022-03-14 LAB — SURGICAL PATHOLOGY

## 2022-03-21 ENCOUNTER — Other Ambulatory Visit: Payer: Self-pay

## 2022-03-21 ENCOUNTER — Encounter (HOSPITAL_BASED_OUTPATIENT_CLINIC_OR_DEPARTMENT_OTHER): Payer: Self-pay | Admitting: Orthopedic Surgery

## 2022-03-26 ENCOUNTER — Other Ambulatory Visit (HOSPITAL_COMMUNITY): Payer: Self-pay | Admitting: Orthopedic Surgery

## 2022-03-29 ENCOUNTER — Ambulatory Visit (HOSPITAL_BASED_OUTPATIENT_CLINIC_OR_DEPARTMENT_OTHER)
Admission: RE | Admit: 2022-03-29 | Discharge: 2022-03-29 | Disposition: A | Discharge status: Home / Self Care | Payer: Medicare Other | Attending: Orthopedic Surgery | Admitting: Orthopedic Surgery

## 2022-03-29 ENCOUNTER — Encounter (HOSPITAL_BASED_OUTPATIENT_CLINIC_OR_DEPARTMENT_OTHER)
Admission: RE | Disposition: A | Discharge status: Home / Self Care | Payer: Self-pay | Source: Home / Self Care | Attending: Orthopedic Surgery

## 2022-03-29 ENCOUNTER — Encounter (HOSPITAL_BASED_OUTPATIENT_CLINIC_OR_DEPARTMENT_OTHER): Payer: Self-pay | Admitting: Orthopedic Surgery

## 2022-03-29 ENCOUNTER — Ambulatory Visit (HOSPITAL_BASED_OUTPATIENT_CLINIC_OR_DEPARTMENT_OTHER): Payer: Medicare Other

## 2022-03-29 ENCOUNTER — Ambulatory Visit (HOSPITAL_BASED_OUTPATIENT_CLINIC_OR_DEPARTMENT_OTHER): Payer: Medicare Other | Admitting: Certified Registered"

## 2022-03-29 ENCOUNTER — Other Ambulatory Visit: Payer: Self-pay

## 2022-03-29 DIAGNOSIS — Z79899 Other long term (current) drug therapy: Secondary | ICD-10-CM | POA: Diagnosis not present

## 2022-03-29 DIAGNOSIS — Z01818 Encounter for other preprocedural examination: Secondary | ICD-10-CM

## 2022-03-29 DIAGNOSIS — K219 Gastro-esophageal reflux disease without esophagitis: Secondary | ICD-10-CM | POA: Insufficient documentation

## 2022-03-29 DIAGNOSIS — M19071 Primary osteoarthritis, right ankle and foot: Secondary | ICD-10-CM | POA: Diagnosis present

## 2022-03-29 DIAGNOSIS — I10 Essential (primary) hypertension: Secondary | ICD-10-CM | POA: Insufficient documentation

## 2022-03-29 HISTORY — DX: Benign prostatic hyperplasia with lower urinary tract symptoms: N13.8

## 2022-03-29 HISTORY — DX: Benign prostatic hyperplasia with lower urinary tract symptoms: N40.1

## 2022-03-29 HISTORY — PX: FOOT ARTHRODESIS: SHX1655

## 2022-03-29 SURGERY — FUSION, JOINT, FOOT
Anesthesia: General | Site: Foot | Laterality: Right

## 2022-03-29 MED ORDER — CEFAZOLIN SODIUM-DEXTROSE 2-4 GM/100ML-% IV SOLN
2.0000 g | INTRAVENOUS | Status: AC
  Administered 2022-03-29: 2 g via INTRAVENOUS

## 2022-03-29 MED ORDER — FENTANYL CITRATE (PF) 100 MCG/2ML IJ SOLN
100.0000 ug | Freq: Once | INTRAMUSCULAR | Status: AC
  Administered 2022-03-29: 100 ug via INTRAVENOUS

## 2022-03-29 MED ORDER — HYDROMORPHONE HCL 1 MG/ML IJ SOLN: 0.2500 mg | INTRAMUSCULAR | Status: DC | PRN

## 2022-03-29 MED ORDER — SODIUM CHLORIDE 0.9 % IV SOLN: INTRAVENOUS | Status: DC

## 2022-03-29 MED ORDER — ASPIRIN 81 MG PO TBEC: 81.0000 mg | DELAYED_RELEASE_TABLET | Freq: Two times a day (BID) | ORAL | 0 refills | Status: DC

## 2022-03-29 MED ORDER — ONDANSETRON HCL 4 MG/2ML IJ SOLN
INTRAMUSCULAR | Status: DC | PRN
  Administered 2022-03-29: 4 mg via INTRAVENOUS

## 2022-03-29 MED ORDER — SENNA 8.6 MG PO TABS
2.0000 | ORAL_TABLET | Freq: Two times a day (BID) | ORAL | 0 refills | Status: DC
Start: 2022-03-29 — End: 2022-06-07

## 2022-03-29 MED ORDER — LACTATED RINGERS IV SOLN: INTRAVENOUS | Status: DC

## 2022-03-29 MED ORDER — 0.9 % SODIUM CHLORIDE (POUR BTL) OPTIME
TOPICAL | Status: DC | PRN
  Administered 2022-03-29: 200 mL

## 2022-03-29 MED ORDER — OXYCODONE HCL 5 MG PO TABS: 5.0000 mg | ORAL_TABLET | Freq: Once | ORAL | Status: DC | PRN

## 2022-03-29 MED ORDER — PROPOFOL 500 MG/50ML IV EMUL
INTRAVENOUS | Status: DC | PRN
  Administered 2022-03-29: 25 ug/kg/min via INTRAVENOUS

## 2022-03-29 MED ORDER — VANCOMYCIN HCL 500 MG IV SOLR
INTRAVENOUS | Status: DC | PRN
  Administered 2022-03-29: 500 mg via TOPICAL

## 2022-03-29 MED ORDER — ONDANSETRON HCL 4 MG/2ML IJ SOLN: 4.0000 mg | Freq: Once | INTRAMUSCULAR | Status: DC | PRN

## 2022-03-29 MED ORDER — OXYCODONE HCL 5 MG PO TABS: 5.0000 mg | ORAL_TABLET | ORAL | 0 refills | Status: DC | PRN

## 2022-03-29 MED ORDER — EPHEDRINE SULFATE (PRESSORS) 50 MG/ML IJ SOLN
INTRAMUSCULAR | Status: DC | PRN
  Administered 2022-03-29 (×2): 10 mg via INTRAVENOUS

## 2022-03-29 MED ORDER — DOCUSATE SODIUM 100 MG PO CAPS: 100.0000 mg | ORAL_CAPSULE | Freq: Two times a day (BID) | ORAL | 0 refills | Status: DC

## 2022-03-29 MED ORDER — FENTANYL CITRATE (PF) 100 MCG/2ML IJ SOLN
INTRAMUSCULAR | Status: AC
  Filled 2022-03-29: qty 2

## 2022-03-29 MED ORDER — LIDOCAINE 2% (20 MG/ML) 5 ML SYRINGE
INTRAMUSCULAR | Status: DC | PRN
  Administered 2022-03-29: 30 mg via INTRAVENOUS

## 2022-03-29 MED ORDER — CEFAZOLIN SODIUM-DEXTROSE 2-4 GM/100ML-% IV SOLN
INTRAVENOUS | Status: AC
  Filled 2022-03-29: qty 100

## 2022-03-29 MED ORDER — MIDAZOLAM HCL 2 MG/2ML IJ SOLN
INTRAMUSCULAR | Status: AC
  Filled 2022-03-29: qty 2

## 2022-03-29 MED ORDER — PHENYLEPHRINE HCL (PRESSORS) 10 MG/ML IV SOLN
INTRAVENOUS | Status: DC | PRN
  Administered 2022-03-29 (×3): 40 ug via INTRAVENOUS

## 2022-03-29 MED ORDER — DEXAMETHASONE SODIUM PHOSPHATE 10 MG/ML IJ SOLN
INTRAMUSCULAR | Status: DC | PRN
  Administered 2022-03-29: 4 mg via INTRAVENOUS

## 2022-03-29 MED ORDER — BUPIVACAINE-EPINEPHRINE (PF) 0.5% -1:200000 IJ SOLN
INTRAMUSCULAR | Status: DC | PRN
  Administered 2022-03-29: 30 mL via PERINEURAL

## 2022-03-29 MED ORDER — PROPOFOL 10 MG/ML IV BOLUS
INTRAVENOUS | Status: DC | PRN
  Administered 2022-03-29: 150 mg via INTRAVENOUS

## 2022-03-29 MED ORDER — OXYCODONE HCL 5 MG/5ML PO SOLN: 5.0000 mg | Freq: Once | ORAL | Status: DC | PRN

## 2022-03-29 SURGICAL SUPPLY — 67 items
APL PRP STRL LF DISP 70% ISPRP (MISCELLANEOUS) ×1
BANDAGE ESMARK 6X9 LF (GAUZE/BANDAGES/DRESSINGS) IMPLANT
BIT DRILL 4.8X200 CANN (BIT) IMPLANT
BIT TREPHINE CORING 8 (BIT) IMPLANT
BLADE AVERAGE 25X9 (BLADE) IMPLANT
BLADE MICRO SAGITTAL (BLADE) IMPLANT
BLADE SURG 15 STRL LF DISP TIS (BLADE) ×2 IMPLANT
BLADE SURG 15 STRL SS (BLADE) ×3
BNDG CMPR 9X6 STRL LF SNTH (GAUZE/BANDAGES/DRESSINGS)
BNDG ELASTIC 4X5.8 VLCR STR LF (GAUZE/BANDAGES/DRESSINGS) ×1 IMPLANT
BNDG ELASTIC 6X5.8 VLCR STR LF (GAUZE/BANDAGES/DRESSINGS) ×1 IMPLANT
BNDG ESMARK 6X9 LF (GAUZE/BANDAGES/DRESSINGS)
CHLORAPREP W/TINT 26 (MISCELLANEOUS) ×1 IMPLANT
COVER BACK TABLE 60X90IN (DRAPES) ×1 IMPLANT
CUFF TOURN SGL QUICK 34 (TOURNIQUET CUFF)
CUFF TRNQT CYL 34X4.125X (TOURNIQUET CUFF) IMPLANT
DRAPE EXTREMITY T 121X128X90 (DISPOSABLE) ×1 IMPLANT
DRAPE OEC MINIVIEW 54X84 (DRAPES) ×1 IMPLANT
DRAPE U-SHAPE 47X51 STRL (DRAPES) ×1 IMPLANT
DRSG MEPITEL 4X7.2 (GAUZE/BANDAGES/DRESSINGS) ×1 IMPLANT
ELECT REM PT RETURN 9FT ADLT (ELECTROSURGICAL) ×1
ELECTRODE REM PT RTRN 9FT ADLT (ELECTROSURGICAL) ×1 IMPLANT
GAUZE PAD ABD 8X10 STRL (GAUZE/BANDAGES/DRESSINGS) ×2 IMPLANT
GAUZE SPONGE 4X4 12PLY STRL (GAUZE/BANDAGES/DRESSINGS) ×1 IMPLANT
GLOVE BIO SURGEON STRL SZ8 (GLOVE) ×1 IMPLANT
GLOVE BIOGEL PI IND STRL 7.0 (GLOVE) IMPLANT
GLOVE BIOGEL PI IND STRL 8 (GLOVE) ×2 IMPLANT
GLOVE ECLIPSE 8.0 STRL XLNG CF (GLOVE) ×1 IMPLANT
GLOVE SURG SS PI 7.0 STRL IVOR (GLOVE) IMPLANT
GOWN STRL REUS W/ TWL LRG LVL3 (GOWN DISPOSABLE) ×1 IMPLANT
GOWN STRL REUS W/ TWL XL LVL3 (GOWN DISPOSABLE) ×2 IMPLANT
GOWN STRL REUS W/TWL LRG LVL3 (GOWN DISPOSABLE) ×2
GOWN STRL REUS W/TWL XL LVL3 (GOWN DISPOSABLE) ×2
GUIDE PIN DRILL TIP 2.8X450HIP (PIN) ×2
LOOP VESSEL MAXI BLUE (MISCELLANEOUS) IMPLANT
LOOP VESSEL MINI RED (MISCELLANEOUS) IMPLANT
NDL SAFETY ECLIP 18X1.5 (MISCELLANEOUS) IMPLANT
NEEDLE HYPO 22GX1.5 SAFETY (NEEDLE) ×1 IMPLANT
PACK BASIN DAY SURGERY FS (CUSTOM PROCEDURE TRAY) ×1 IMPLANT
PAD CAST 4YDX4 CTTN HI CHSV (CAST SUPPLIES) ×1 IMPLANT
PADDING CAST COTTON 4X4 STRL (CAST SUPPLIES) ×1
PADDING CAST COTTON 6X4 STRL (CAST SUPPLIES) ×1 IMPLANT
PENCIL SMOKE EVACUATOR (MISCELLANEOUS) ×1 IMPLANT
PIN GUIDE DRILL TIP 2.8X450HIP (PIN) IMPLANT
SANITIZER HAND PURELL 535ML FO (MISCELLANEOUS) ×1 IMPLANT
SCREW CANN 6.5X90 (Screw) ×1 IMPLANT
SCREW CANN 6.5X90 16MM THD (Screw) IMPLANT
SCREW CANN THRD 6.5X80 (Screw) IMPLANT
SCREW CANNULATED 6.5X90MM (Screw) ×1 IMPLANT
SHEET MEDIUM DRAPE 40X70 STRL (DRAPES) ×1 IMPLANT
SLEEVE SCD COMPRESS KNEE MED (STOCKING) ×1 IMPLANT
SPLINT PLASTER CAST FAST 5X30 (CAST SUPPLIES) ×20 IMPLANT
SPONGE SURGIFOAM ABS GEL 12-7 (HEMOSTASIS) IMPLANT
SPONGE T-LAP 18X18 ~~LOC~~+RFID (SPONGE) ×1 IMPLANT
STOCKINETTE 6  STRL (DRAPES) ×1
STOCKINETTE 6 STRL (DRAPES) ×1 IMPLANT
SUCTION FRAZIER HANDLE 10FR (MISCELLANEOUS) ×1
SUCTION TUBE FRAZIER 10FR DISP (MISCELLANEOUS) ×1 IMPLANT
SUT ETHILON 3 0 PS 1 (SUTURE) ×1 IMPLANT
SUT MNCRL AB 3-0 PS2 18 (SUTURE) ×1 IMPLANT
SUT VIC AB 2-0 SH 27 (SUTURE) ×1
SUT VIC AB 2-0 SH 27XBRD (SUTURE) ×1 IMPLANT
SUT VICRYL 0 SH 27 (SUTURE) IMPLANT
SYR BULB EAR ULCER 3OZ GRN STR (SYRINGE) ×1 IMPLANT
TOWEL GREEN STERILE FF (TOWEL DISPOSABLE) ×2 IMPLANT
TUBE CONNECTING 20X1/4 (TUBING) ×1 IMPLANT
UNDERPAD 30X36 HEAVY ABSORB (UNDERPADS AND DIAPERS) ×1 IMPLANT

## 2022-03-29 NOTE — Anesthesia Preprocedure Evaluation (Addendum)
Anesthesia Evaluation  Patient identified by MRN, date of birth, ID band Patient awake    Reviewed: Allergy & Precautions, NPO status , Patient's Chart, lab work & pertinent test results  History of Anesthesia Complications (+) history of anesthetic complications  Airway Mallampati: II  TM Distance: >3 FB Neck ROM: Full    Dental  (+) Caps, Chipped, Dental Advisory Given   Pulmonary neg pulmonary ROS,    Pulmonary exam normal breath sounds clear to auscultation       Cardiovascular hypertension, Pt. on medications Normal cardiovascular exam Rhythm:Regular Rate:Normal     Neuro/Psych negative neurological ROS  negative psych ROS   GI/Hepatic GERD  Medicated and Controlled,  Endo/Other  Gout Hyperlipidemia  Renal/GU Renal diseaseHx/o nephrolithiasis   BPH    Musculoskeletal  (+) Arthritis , Osteoarthritis,  OA right foot/ankle   Abdominal   Peds  Hematology  (+) Blood dyscrasia, anemia ,   Anesthesia Other Findings   Reproductive/Obstetrics                            Anesthesia Physical Anesthesia Plan  ASA: 2  Anesthesia Plan:    Post-op Pain Management: Regional block* and Minimal or no pain anticipated   Induction: Intravenous  PONV Risk Score and Plan: 2 and Treatment may vary due to age or medical condition, Ondansetron and Dexamethasone  Airway Management Planned: LMA  Additional Equipment: None  Intra-op Plan:   Post-operative Plan: Extubation in OR  Informed Consent: I have reviewed the patients History and Physical, chart, labs and discussed the procedure including the risks, benefits and alternatives for the proposed anesthesia with the patient or authorized representative who has indicated his/her understanding and acceptance.     Dental advisory given  Plan Discussed with: CRNA and Anesthesiologist  Anesthesia Plan Comments:         Anesthesia Quick  Evaluation

## 2022-03-29 NOTE — Anesthesia Procedure Notes (Signed)
Anesthesia Regional Block: Popliteal block   Pre-Anesthetic Checklist: , timeout performed,  Correct Patient, Correct Site, Correct Laterality,  Correct Procedure, Correct Position, site marked,  Risks and benefits discussed,  Surgical consent,  Pre-op evaluation,  At surgeon's request and post-op pain management  Laterality: Right  Prep: chloraprep       Needles:  Injection technique: Single-shot      Additional Needles:   Procedures:,,,, ultrasound used (permanent image in chart),,    Narrative:  Start time: 03/29/2022 9:50 AM End time: 03/29/2022 9:55 AM Injection made incrementally with aspirations every 5 mL.  Performed by: Personally  Anesthesiologist: Josephine Igo, MD  Additional Notes: Timeout performed. Patient sedated. Relevant anatomy ID'd using [place]. Incremental 2-6m injection of LA with frequent aspiration. Patient tolerated procedure well.     Right Popliteal Block

## 2022-03-29 NOTE — Transfer of Care (Signed)
Immediate Anesthesia Transfer of Care Note  Patient: Scott Spence  Procedure(s) Performed: Gwyneth Revels ARTHRODESIS (Right: Foot)  Patient Location: PACU  Anesthesia Type:GA combined with regional for post-op pain  Level of Consciousness: drowsy and patient cooperative  Airway & Oxygen Therapy: Patient Spontanous Breathing and Patient connected to face mask oxygen  Post-op Assessment: Report given to RN and Post -op Vital signs reviewed and stable  Post vital signs: Reviewed and stable  Last Vitals:  Vitals Value Taken Time  BP 99/67 03/29/22 1151  Temp    Pulse 85 03/29/22 1153  Resp 13 03/29/22 1153  SpO2 96 % 03/29/22 1153  Vitals shown include unvalidated device data.  Last Pain:  Vitals:   03/29/22 0851  TempSrc: Oral  PainSc: 0-No pain         Complications: No notable events documented.

## 2022-03-29 NOTE — Anesthesia Procedure Notes (Signed)
Procedure Name: LMA Insertion Date/Time: 03/29/2022 10:34 AM  Performed by: Avanti Jetter, Ernesta Amble, CRNAPre-anesthesia Checklist: Patient identified, Emergency Drugs available, Suction available and Patient being monitored Patient Re-evaluated:Patient Re-evaluated prior to induction Oxygen Delivery Method: Circle system utilized Preoxygenation: Pre-oxygenation with 100% oxygen Induction Type: IV induction Ventilation: Mask ventilation without difficulty LMA: LMA inserted LMA Size: 4.0 Number of attempts: 1 Airway Equipment and Method: Bite block Placement Confirmation: positive ETCO2 Tube secured with: Tape Dental Injury: Teeth and Oropharynx as per pre-operative assessment

## 2022-03-29 NOTE — Op Note (Signed)
03/29/2022  11:59 AM  PATIENT:  Scott Spence  71 y.o. male  PRE-OPERATIVE DIAGNOSIS: Right foot subtalar joint arthritis  POST-OPERATIVE DIAGNOSIS: Same  Procedure(s):  1.  Right subtalar joint arthrodesis   2.  Right foot AP, lateral and Harris heel xrays   3.  Right ankle AP and lateral xrays  SURGEON:  Wylene Simmer, MD  ASSISTANT: Mechele Claude, PA-C  ANESTHESIA:   General, regional  EBL:  minimal   TOURNIQUET:   Total Tourniquet Time Documented: Thigh (Right) - 53 minutes Total: Thigh (Right) - 53 minutes  COMPLICATIONS:  None apparent  DISPOSITION:  Extubated, awake and stable to recovery.  INDICATION FOR PROCEDURE: 71 year old male with a long history of right lateral hindfoot pain.  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  He has failed nonoperative treatment to date.  He presents today for right subtalar joint arthrodesis.  The risks and benefits of the alternative treatment options have been discussed in detail.  The patient wishes to proceed with surgery and specifically understands risks of bleeding, infection, nerve damage, blood clots, need for additional surgery, amputation and death.   PROCEDURE IN DETAIL: After preoperative consent was obtained and the correct operative site was identified, the patient was brought the operating room and placed upon the operating table.  General anesthesia was induced.  Preoperative antibiotics were administered.  Surgical timeout was taken.  The right lower extremity was prepped and draped in standard sterile fashion with a tourniquet around the thigh.  The extremity was elevated and the tourniquet was inflated to 250 mmHg.  A curvilinear incision was made over the sinus Tarsi.  Dissection was carried sharply down through the subcutaneous tissues.  The sinus Tarsi was entered.  Osteophytes at the anterolateral subtalar joint  line were removed with an osteotome exposing the posterior facet.  The remaining articular cartilage was removed from both sides of the joint at the posterior facet and the middle facet with curettes and a rondure.  The wound was then irrigated copiously.  A small drill bit was used to perforate the subchondral bone on both sides of the joint.  The subchondral bone was then further broken up with a small curved osteotome.  The joint was reduced.  A guidepin for a [employer] 6.5 mm cannulated titanium screw was inserted from the calcaneus tuberosity across the posterior facet and into the talus.  AP and lateral radiographs of the ankle as well as AP, lateral and Harris heel radiographs of the foot showed appropriate position of the guidepin.  A second guidepin was then placed just medial to the first and advanced across the subtalar joint into the head of the talus.  Radiographs again confirmed appropriate position of the guidepin.  The lateral guide pin was overdrilled and a partially-threaded 6.5 mm cannulated screw was inserted.  It was used to compress the posterior facet and was noted of excellent purchase.  A second screw was placed medial to the first after predrilling over the guidepin.  Radiographs confirmed appropriate reduction of the subtalar joint and appropriate position and length of both screws.  The wound was irrigated.  Vancomycin powder was sprinkled in the wound.  Deep subcutaneous tissues were approximated with Vicryl.  The skin incision was closed with horizontal mattress sutures of 3-0 nylon.  The posterior incision was closed with nylon as well.  Sterile dressings were applied followed by well-padded  short leg splint.  The tourniquet was released after application of the dressings.  The patient was awakened from anesthesia and transported to the recovery room in stable condition.   FOLLOW UP PLAN: Nonweightbearing on the right lower extremity.  Follow-up in the office in 2 weeks for  suture removal and conversion to a short leg cast.  Xarelto for DVT prophylaxis.  Plan nonweightbearing immobilization for 6 weeks postop.   RADIOGRAPHS:    Mechele Claude PA-C was present and scrubbed for the duration of the operative case. His assistance was essential in positioning the patient, prepping and draping, gaining and maintaining exposure, performing the operation, closing and dressing the wounds and applying the splint.

## 2022-03-29 NOTE — Discharge Instructions (Addendum)
Wylene Simmer, MD [facility]  Please read the following information regarding your care after surgery.  Medications  You only need a prescription for the narcotic pain medicine (ex. oxycodone, Percocet, Norco).  All of the other medicines listed below are available over the counter. ? Aleve 2 pills twice a day for the first 3 days after surgery. ? acetominophen (Tylenol) 650 mg every 4-6 hours as you need for minor to moderate pain ? oxycodone as prescribed for severe pain  Narcotic pain medicine (ex. oxycodone, Percocet, Vicodin) will cause constipation.  To prevent this problem, take the following medicines while you are taking any pain medicine. ? docusate sodium (Colace) 100 mg twice a day ? senna (Senokot) 2 tablets twice a day  ? To help prevent blood clots, take a baby aspirin (81 mg) twice a day for six weeks after surgery.  You should also get up every hour while you are awake to move around.    Weight Bearing ? Do not bear any weight on the operated leg or foot.  Cast / Splint / Dressing ? Keep your splint, cast or dressing clean and dry.  Don't put anything (coat hanger, pencil, etc) down inside of it.  If it gets damp, use a hair dryer on the cool setting to dry it.  If it gets soaked, call the office to schedule an appointment for a cast change.   After your dressing, cast or splint is removed; you may shower, but do not soak or scrub the wound.  Allow the water to run over it, and then gently pat it dry.  Swelling It is normal for you to have swelling where you had surgery.  To reduce swelling and pain, keep your toes above your nose for at least 3 days after surgery.  It may be necessary to keep your foot or leg elevated for several weeks.  If it hurts, it should be elevated.  Follow Up Call my office at [phone] when you are discharged from the hospital or surgery center to schedule an appointment to be seen two weeks after surgery.  Call my office at [phone] if  you develop a fever >101.5 F, nausea, vomiting, bleeding from the surgical site or severe pain.     Post Anesthesia Home Care Instructions  Activity: Get plenty of rest for the remainder of the day. A responsible individual must stay with you for 24 hours following the procedure.  For the next 24 hours, DO NOT: -Drive a car -Paediatric nurse -Drink alcoholic beverages -Take any medication unless instructed by your physician -Make any legal decisions or sign important papers.  Meals: Start with liquid foods such as gelatin or soup. Progress to regular foods as tolerated. Avoid greasy, spicy, heavy foods. If nausea and/or vomiting occur, drink only clear liquids until the nausea and/or vomiting subsides. Call your physician if vomiting continues.  Special Instructions/Symptoms: Your throat may feel dry or sore from the anesthesia or the breathing tube placed in your throat during surgery. If this causes discomfort, gargle with warm salt water. The discomfort should disappear within 24 hours.  If you had a scopolamine patch placed behind your ear for the management of post- operative nausea and/or vomiting:  1. The medication in the patch is effective for 72 hours, after which it should be removed.  Wrap patch in a tissue and discard in the trash. Wash hands thoroughly with soap and water. 2. You may remove the patch earlier than 72 hours if you experience  unpleasant side effects which may include dry mouth, dizziness or visual disturbances. 3. Avoid touching the patch. Wash your hands with soap and water after contact with the patch.    Regional Anesthesia Blocks  1. Numbness or the inability to move the "blocked" extremity may last from 3-48 hours after placement. The length of time depends on the medication injected and your individual response to the medication. If the numbness is not going away after 48 hours, call your surgeon.  2. The extremity that is blocked will need to be  protected until the numbness is gone and the  Strength has returned. Because you cannot feel it, you will need to take extra care to avoid injury. Because it may be weak, you may have difficulty moving it or using it. You may not know what position it is in without looking at it while the block is in effect.  3. For blocks in the legs and feet, returning to weight bearing and walking needs to be done carefully. You will need to wait until the numbness is entirely gone and the strength has returned. You should be able to move your leg and foot normally before you try and bear weight or walk. You will need someone to be with you when you first try to ensure you do not fall and possibly risk injury.  4. Bruising and tenderness at the needle site are common side effects and will resolve in a few days.  5. Persistent numbness or new problems with movement should be communicated to the surgeon or the [place] [phone] [place] [phone]).

## 2022-03-29 NOTE — Progress Notes (Signed)
Assisted Dr. Royce Macadamia with right, popliteal, ultrasound guided block. Side rails up, monitors on throughout procedure. See vital signs in flow sheet. Tolerated Procedure well.

## 2022-03-29 NOTE — Anesthesia Postprocedure Evaluation (Signed)
Anesthesia Post Note  Patient: Scott Spence  Procedure(s) Performed: SUBTALAR ARTHRODESIS (Right: Foot)     Patient location during evaluation: PACU Anesthesia Type: General Level of consciousness: awake and alert and oriented Pain management: pain level controlled Vital Signs Assessment: post-procedure vital signs reviewed and stable Respiratory status: spontaneous breathing, nonlabored ventilation and respiratory function stable Cardiovascular status: blood pressure returned to baseline and stable Postop Assessment: no apparent nausea or vomiting Anesthetic complications: no   No notable events documented.  Last Vitals:  Vitals:   03/29/22 1200 03/29/22 1215  BP: 123/76 130/83  Pulse: 79 86  Resp: 13 (!) 24  Temp:    SpO2: 97% 100%    Last Pain:  Vitals:   03/29/22 1215  TempSrc:   PainSc: 0-No pain        RLE Motor Response: No movement due to regional block (03/29/22 1215) RLE Sensation: Numbness (03/29/22 1215)      Tekla Malachowski A.

## 2022-03-29 NOTE — H&P (Signed)
Scott Spence is an 71 y.o. male.   Chief Complaint: Right ankle and hindfoot pain HPI: 71 year old male without significant past medical history complains of a long history of right ankle pain and weakness.  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  MRI reveals fatty infiltration of the muscle bellies.  He has failed nonoperative treatment including bracing, activity modification and shoewear modification.  We discussed treatment options including attempted reconstruction of the tendons versus arthrodesis of the subtalar joint.  Given the fatty infiltration evident at the muscle bellies arthrodesis was selected as the better option.  He presents now for surgical treatment of his right lower extremity.  Past Medical History:  Diagnosis Date   BPH with urinary obstruction    Chronic kidney disease    hemorrhagic nephritis as a child   Complication of anesthesia    collapsed lung with local for LUE surgery   GERD (gastroesophageal reflux disease)    Gout    History of kidney stones    Hypertension     Past Surgical History:  Procedure Laterality Date   APPENDECTOMY     HERNIA REPAIR     HOLEP-LASER ENUCLEATION OF THE PROSTATE WITH MORCELLATION N/A 03/09/2022   Procedure: HOLEP-LASER ENUCLEATION OF THE PROSTATE WITH MORCELLATION;  Surgeon: Billey Co, MD;  Location: [facility] ORS;  Service: Urology;  Laterality: N/A;   KIDNEY STONE SURGERY     TOTAL HIP ARTHROPLASTY Right 04/01/2018   Procedure: TOTAL HIP ARTHROPLASTY ANTERIOR APPROACH;  Surgeon: Hessie Knows, MD;  Location: [facility] ORS;  Service: Orthopedics;  Laterality: Right;    Family History  Problem Relation Age of Onset   Prostate cancer Neg Hx    Bladder Cancer Neg Hx    Kidney cancer Neg Hx    Social History:  reports that he has never smoked. He has never been exposed to tobacco smoke. He has never used smokeless tobacco. He reports current alcohol use of about 14.0 standard drinks of alcohol per week. He  reports that he does not use drugs.  Allergies:  Allergies  Allergen Reactions   Meperidine Other (See Comments)    GI Upset   Sulfa Antibiotics Hives   Gabapentin Other (See Comments)    Migraine HA   Lisinopril Cough    Medications Prior to Admission  Medication Sig Dispense Refill   allopurinol (ZYLOPRIM) 300 MG tablet Take 300 mg by mouth daily.      calcium gluconate 500 MG tablet Take 1 tablet by mouth daily.     Coenzyme Q-10 200 MG CAPS Take 200 mg by mouth daily.      Cyanocobalamin (VITAMIN B 12 PO) Take 3,000 mcg by mouth daily.      Ergocalciferol (VITAMIN D2) 2000 units TABS Take 2,000 Units by mouth daily.     Glucosamine-Chondroit-Vit C-Mn (GLUCOSAMINE 1500 COMPLEX PO) Take 1,500 mg by mouth daily.     ibuprofen (ADVIL) 200 MG tablet Take 200 mg by mouth daily.     loratadine (CLARITIN) 10 MG tablet Take 10 mg by mouth daily.     losartan (COZAAR) 100 MG tablet Take 100 mg by mouth daily.      Magnesium 250 MG TABS Take 250 mg by mouth daily.     mometasone (NASONEX) 50 MCG/ACT nasal spray Place 1 spray into the nose 2 (two) times daily.      Multiple Vitamin (MULTIVITAMIN) capsule Take 1 capsule by mouth daily.      omeprazole (PRILOSEC) 20 MG  capsule Take 20 mg by mouth daily.      RESTASIS 0.05 % ophthalmic emulsion Place 2 drops into both eyes 2 (two) times daily.      rosuvastatin (CRESTOR) 5 MG tablet Take 5 mg by mouth daily.     calcium carbonate (TUMS - DOSED IN MG ELEMENTAL CALCIUM) 500 MG chewable tablet Chew 1 tablet by mouth daily. (Patient not taking: Reported on 03/05/2022)      No results found for this or any previous visit (from the past 48 hour(s)). DG MINI C-ARM IMAGE ONLY  Result Date: 2022-04-22 There is no interpretation for this exam.  This order is for images obtained during a surgical procedure.  Please See "Surgeries" Tab for more information regarding the procedure.    Review of Systems no recent fever, chills, nausea, vomiting or changes  in his appetite  Blood pressure (!) 152/96, pulse 69, temperature 98.2 F (36.8 C), temperature source Oral, resp. rate 13, height 5' 7.5" (1.715 m), weight 79.4 kg, SpO2 96 %. Physical Exam  Well-nourished well-developed man in no apparent distress.  Alert and oriented.  Normal mood and affect.  Gait is antalgic to the right.  He has 0 out of 5 strength in eversion.  5 out of 5 in plantarflexion and inversion and 5 out of 5 in dorsiflexion.  I can passively correct his hindfoot to a neutral position.  Skin is healthy and intact.  Pulses are palpable in the foot.  Intact sensibility to light touch in the sural nerve distribution.  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  The risks and benefits of the alternative treatment options have been discussed in detail.  The patient wishes to proceed with surgery and specifically understands risks of bleeding, infection, nerve damage, blood clots, need for additional surgery, amputation and death.   Wylene Simmer, MD 2022-04-22, 10:10 AM

## 2022-03-30 ENCOUNTER — Encounter (HOSPITAL_BASED_OUTPATIENT_CLINIC_OR_DEPARTMENT_OTHER): Payer: Self-pay | Admitting: Orthopedic Surgery

## 2022-05-05 ENCOUNTER — Inpatient Hospital Stay
Admission: EM | Admit: 2022-05-05 | Discharge: 2022-05-07 | DRG: 696 | Disposition: A | Discharge status: Home / Self Care | Payer: Medicare Other | Attending: Internal Medicine | Admitting: Internal Medicine

## 2022-05-05 ENCOUNTER — Emergency Department: Payer: Medicare Other

## 2022-05-05 ENCOUNTER — Other Ambulatory Visit: Payer: Self-pay

## 2022-05-05 DIAGNOSIS — N3289 Other specified disorders of bladder: Secondary | ICD-10-CM | POA: Diagnosis present

## 2022-05-05 DIAGNOSIS — Z888 Allergy status to other drugs, medicaments and biological substances status: Secondary | ICD-10-CM

## 2022-05-05 DIAGNOSIS — R339 Retention of urine, unspecified: Secondary | ICD-10-CM | POA: Diagnosis present

## 2022-05-05 DIAGNOSIS — R310 Gross hematuria: Secondary | ICD-10-CM | POA: Diagnosis not present

## 2022-05-05 DIAGNOSIS — Z9079 Acquired absence of other genital organ(s): Secondary | ICD-10-CM

## 2022-05-05 DIAGNOSIS — M109 Gout, unspecified: Secondary | ICD-10-CM | POA: Diagnosis present

## 2022-05-05 DIAGNOSIS — Z79899 Other long term (current) drug therapy: Secondary | ICD-10-CM

## 2022-05-05 DIAGNOSIS — Z96641 Presence of right artificial hip joint: Secondary | ICD-10-CM | POA: Diagnosis present

## 2022-05-05 DIAGNOSIS — N200 Calculus of kidney: Secondary | ICD-10-CM | POA: Diagnosis present

## 2022-05-05 DIAGNOSIS — Z7982 Long term (current) use of aspirin: Secondary | ICD-10-CM

## 2022-05-05 DIAGNOSIS — I10 Essential (primary) hypertension: Secondary | ICD-10-CM | POA: Diagnosis present

## 2022-05-05 DIAGNOSIS — D72829 Elevated white blood cell count, unspecified: Secondary | ICD-10-CM | POA: Diagnosis present

## 2022-05-05 DIAGNOSIS — R319 Hematuria, unspecified: Secondary | ICD-10-CM | POA: Diagnosis present

## 2022-05-05 DIAGNOSIS — Z87442 Personal history of urinary calculi: Secondary | ICD-10-CM

## 2022-05-05 DIAGNOSIS — R338 Other retention of urine: Secondary | ICD-10-CM

## 2022-05-05 DIAGNOSIS — K219 Gastro-esophageal reflux disease without esophagitis: Secondary | ICD-10-CM | POA: Diagnosis present

## 2022-05-05 DIAGNOSIS — Z981 Arthrodesis status: Secondary | ICD-10-CM

## 2022-05-05 DIAGNOSIS — Z882 Allergy status to sulfonamides status: Secondary | ICD-10-CM

## 2022-05-05 LAB — COMPREHENSIVE METABOLIC PANEL
ALT: 27 U/L (ref 0–44)
AST: 29 U/L (ref 15–41)
Albumin: 4.5 g/dL (ref 3.5–5.0)
Alkaline Phosphatase: 90 U/L (ref 38–126)
Anion gap: 10 (ref 5–15)
BUN: 21 mg/dL (ref 8–23)
CO2: 19 mmol/L — ABNORMAL LOW (ref 22–32)
Calcium: 9.7 mg/dL (ref 8.9–10.3)
Chloride: 110 mmol/L (ref 98–111)
Creatinine, Ser: 1.08 mg/dL (ref 0.61–1.24)
GFR, Estimated: >60 mL/min (ref >60)
Glucose, Bld: 130 mg/dL — ABNORMAL HIGH (ref 70–99)
Potassium: 4 mmol/L (ref 3.5–5.1)
Sodium: 139 mmol/L (ref 135–145)
Total Bilirubin: 0.8 mg/dL (ref 0.3–1.2)
Total Protein: 7.5 g/dL (ref 6.5–8.1)

## 2022-05-05 LAB — CBC WITH DIFFERENTIAL/PLATELET
Abs Immature Granulocytes: 0.04 10*3/uL (ref 0.00–0.07)
Basophils Absolute: 0.1 10*3/uL (ref 0.0–0.1)
Basophils Relative: 1 %
Eosinophils Absolute: 0.2 10*3/uL (ref 0.0–0.5)
Eosinophils Relative: 2 %
HCT: 45.6 % (ref 39.0–52.0)
Hemoglobin: 15.5 g/dL (ref 13.0–17.0)
Immature Granulocytes: 0 %
Lymphocytes Relative: 33 %
Lymphs Abs: 3.2 10*3/uL (ref 0.7–4.0)
MCH: 32 pg (ref 26.0–34.0)
MCHC: 34 g/dL (ref 30.0–36.0)
MCV: 94.2 fL (ref 80.0–100.0)
Monocytes Absolute: 0.7 10*3/uL (ref 0.1–1.0)
Monocytes Relative: 8 %
Neutro Abs: 5.5 10*3/uL (ref 1.7–7.7)
Neutrophils Relative %: 56 %
Platelets: 183 10*3/uL (ref 150–400)
RBC: 4.84 MIL/uL (ref 4.22–5.81)
RDW: 12.7 % (ref 11.5–15.5)
WBC: 9.8 10*3/uL (ref 4.0–10.5)
nRBC: 0 % (ref 0.0–0.2)

## 2022-05-05 LAB — TYPE AND SCREEN
ABO/RH(D): A POS
Antibody Screen: NEGATIVE

## 2022-05-05 MED ORDER — IOHEXOL 300 MG/ML  SOLN
100.0000 mL | Freq: Once | INTRAMUSCULAR | Status: AC | PRN
  Administered 2022-05-05: 100 mL via INTRAVENOUS

## 2022-05-05 NOTE — ED Provider Triage Note (Signed)
Emergency Medicine Provider Triage Evaluation Note  Scott Spence , a 71 y.o. male  was evaluated in triage.  Pt complains of hematuria. Patient with frank bloody urine. No flank pain. HX prostrate resection. No abd pain. No HX nephrolithiasis.  Review of Systems  Positive: hematuria Negative: Flank pain, abd pain, N/V  Physical Exam  BP (!) 168/126   Pulse (!) 103   Temp 98.4 F (36.9 C) (Oral)   Resp 18   Wt 79.4 kg   SpO2 94%   BMI 27.00 kg/m  Gen:   Awake, no distress   Resp:  Normal effort  MSK:   Moves extremities without difficulty  Other:    Medical Decision Making  Medically screening exam initiated at 9:55 PM.  Appropriate orders placed.  Scott Spence was informed that the remainder of the evaluation will be completed by another provider, this initial triage assessment does not replace that evaluation, and the importance of remaining in the ED until their evaluation is complete.  Patient will have urinalysis, labs, CT scan   Darletta Moll, PA-C 05/05/22 2155

## 2022-05-05 NOTE — ED Triage Notes (Signed)
Pt coming pov from home today with hematuria. Pt had prostate removed in august for precancerous prostate. Pt stating today suddenly developed bloody urine. Pt endorsing urgency and frequency- pt denies any issues or pain prior to bleeding tonight.

## 2022-05-06 DIAGNOSIS — N200 Calculus of kidney: Secondary | ICD-10-CM | POA: Diagnosis present

## 2022-05-06 DIAGNOSIS — I10 Essential (primary) hypertension: Secondary | ICD-10-CM | POA: Diagnosis present

## 2022-05-06 DIAGNOSIS — R310 Gross hematuria: Secondary | ICD-10-CM

## 2022-05-06 DIAGNOSIS — Z87442 Personal history of urinary calculi: Secondary | ICD-10-CM | POA: Diagnosis not present

## 2022-05-06 DIAGNOSIS — Z888 Allergy status to other drugs, medicaments and biological substances status: Secondary | ICD-10-CM | POA: Diagnosis not present

## 2022-05-06 DIAGNOSIS — Z96641 Presence of right artificial hip joint: Secondary | ICD-10-CM | POA: Diagnosis present

## 2022-05-06 DIAGNOSIS — Z981 Arthrodesis status: Secondary | ICD-10-CM | POA: Diagnosis not present

## 2022-05-06 DIAGNOSIS — R339 Retention of urine, unspecified: Secondary | ICD-10-CM | POA: Diagnosis not present

## 2022-05-06 DIAGNOSIS — K219 Gastro-esophageal reflux disease without esophagitis: Secondary | ICD-10-CM | POA: Diagnosis present

## 2022-05-06 DIAGNOSIS — R338 Other retention of urine: Secondary | ICD-10-CM

## 2022-05-06 DIAGNOSIS — Z882 Allergy status to sulfonamides status: Secondary | ICD-10-CM | POA: Diagnosis not present

## 2022-05-06 DIAGNOSIS — Z79899 Other long term (current) drug therapy: Secondary | ICD-10-CM | POA: Diagnosis not present

## 2022-05-06 DIAGNOSIS — Z7982 Long term (current) use of aspirin: Secondary | ICD-10-CM | POA: Diagnosis not present

## 2022-05-06 DIAGNOSIS — M109 Gout, unspecified: Secondary | ICD-10-CM | POA: Diagnosis present

## 2022-05-06 DIAGNOSIS — Z9079 Acquired absence of other genital organ(s): Secondary | ICD-10-CM

## 2022-05-06 DIAGNOSIS — D72829 Elevated white blood cell count, unspecified: Secondary | ICD-10-CM | POA: Diagnosis present

## 2022-05-06 DIAGNOSIS — N3289 Other specified disorders of bladder: Secondary | ICD-10-CM | POA: Diagnosis present

## 2022-05-06 LAB — CBC
HCT: 40.9 % (ref 39.0–52.0)
HCT: 39.9 % (ref 39.0–52.0)
Hemoglobin: 13.8 g/dL (ref 13.0–17.0)
Hemoglobin: 13.6 g/dL (ref 13.0–17.0)
MCH: 31.9 pg (ref 26.0–34.0)
MCH: 32.3 pg (ref 26.0–34.0)
MCHC: 33.7 g/dL (ref 30.0–36.0)
MCHC: 34.1 g/dL (ref 30.0–36.0)
MCV: 94.7 fL (ref 80.0–100.0)
MCV: 94.8 fL (ref 80.0–100.0)
Platelets: 144 10*3/uL — ABNORMAL LOW (ref 150–400)
Platelets: 152 10*3/uL (ref 150–400)
RBC: 4.32 MIL/uL (ref 4.22–5.81)
RBC: 4.21 MIL/uL — ABNORMAL LOW (ref 4.22–5.81)
RDW: 12.9 % (ref 11.5–15.5)
RDW: 13 % (ref 11.5–15.5)
WBC: 8 10*3/uL (ref 4.0–10.5)
WBC: 10.2 10*3/uL (ref 4.0–10.5)
nRBC: 0 % (ref 0.0–0.2)
nRBC: 0 % (ref 0.0–0.2)

## 2022-05-06 MED ORDER — ONDANSETRON HCL 4 MG/2ML IJ SOLN: 4.0000 mg | Freq: Four times a day (QID) | INTRAMUSCULAR | Status: DC | PRN

## 2022-05-06 MED ORDER — MORPHINE SULFATE (PF) 4 MG/ML IV SOLN
4.0000 mg | Freq: Once | INTRAVENOUS | Status: AC
  Administered 2022-05-06: 4 mg via INTRAVENOUS
  Filled 2022-05-06: qty 1

## 2022-05-06 MED ORDER — ONDANSETRON HCL 4 MG PO TABS: 4.0000 mg | ORAL_TABLET | Freq: Four times a day (QID) | ORAL | Status: DC | PRN

## 2022-05-06 MED ORDER — OXYCODONE HCL 5 MG PO TABS: 5.0000 mg | ORAL_TABLET | ORAL | Status: DC | PRN

## 2022-05-06 MED ORDER — OXYBUTYNIN CHLORIDE 5 MG PO TABS
5.0000 mg | ORAL_TABLET | Freq: Three times a day (TID) | ORAL | Status: DC | PRN
  Administered 2022-05-06: 5 mg via ORAL
  Filled 2022-05-06: qty 1

## 2022-05-06 MED ORDER — SODIUM CHLORIDE 0.9 % IR SOLN
3000.0000 mL | Status: DC
  Administered 2022-05-06 – 2022-05-07 (×7): 3000 mL

## 2022-05-06 MED ORDER — MORPHINE SULFATE (PF) 2 MG/ML IV SOLN
2.0000 mg | INTRAVENOUS | Status: DC | PRN
  Administered 2022-05-06 – 2022-05-07 (×3): 2 mg via INTRAVENOUS
  Filled 2022-05-06 (×3): qty 1

## 2022-05-06 MED ORDER — ACETAMINOPHEN 650 MG RE SUPP: 650.0000 mg | Freq: Four times a day (QID) | RECTAL | Status: DC | PRN

## 2022-05-06 MED ORDER — ACETAMINOPHEN 325 MG PO TABS
650.0000 mg | ORAL_TABLET | Freq: Four times a day (QID) | ORAL | Status: DC | PRN
  Administered 2022-05-07: 650 mg via ORAL
  Filled 2022-05-06: qty 2

## 2022-05-06 NOTE — Progress Notes (Signed)
No charge progress note.  Scott Spence is a 71 y.o. male with medical history significant for BPH status post HoLEP in August 2023, status post right ankle surgery September 2023 currently on aspirin who presents to the ED with gross hematuria, difficulty voiding and suprapubic discomfort.  The symptoms started at around 6:30 PM on 10/14.  He denied fever or chills.  States he had been doing well since his procedure in August. ED course and data review: BP 139/82 with pulse 103 and otherwise normal vitals. CBC and CMP mostly unremarkable.  Urinalysis pending. CT abdomen and pelvis showing the following: IMPRESSION: Moderate hemorrhage within the bladder. Consider cystoscopy as clinically warranted.  Multiple bilateral nonobstructing renal calculi measuring up to 2 mm. No ureteral calculi or hydronephrosis.  Status post prostatectomy.   Patient was treated with 3 L of continuous bladder irrigation but continued to have pinkish-red drainage.  Urology was consulted.  Patient was seen and examined today.  Urine started clearing out, still pinkish urine in tubing.  Pending urology evaluation. Hemoglobin on repeat was 13.8, decreased from 15. Rest of the exam was unremarkable. We will continue monitoring hemoglobin. Will appreciate urology recommendations.

## 2022-05-06 NOTE — ED Provider Notes (Signed)
[facility] Provider Note    Event Date/Time   First MD Initiated Contact with Patient 05/05/22 2322     (approximate)   History   Chief Complaint: Hematuria   HPI  Scott Spence is a 71 y.o. male with a history of HoLEP procedure on August 18 of this year by Dr. Diamantina Providence who comes to the ED with gross hematuria that started today, associated with severe pelvic pain and a feeling of being unable to empty his bladder.  Denies fever chest pain shortness of breath.  No trauma.  Does not take blood thinners.     Physical Exam   Triage Vital Signs: ED Triage Vitals  Enc Vitals Group     BP 05/05/22 2152 (!) 168/126     Pulse Rate 05/05/22 2147 (!) 103     Resp 05/05/22 2147 18     Temp 05/05/22 2147 98.4 F (36.9 C)     Temp Source 05/05/22 2147 Oral     SpO2 05/05/22 2147 94 %     Weight 05/05/22 2149 175 lb (79.4 kg)     Height --      Head Circumference --      Peak Flow --      Pain Score 05/05/22 2149 0     Pain Loc --      Pain Edu? --      Excl. in [place]? --     Most recent vital signs: Vitals:   05/05/22 2147 05/05/22 2152  BP:  (!) 168/126  Pulse: (!) 103   Resp: 18   Temp: 98.4 F (36.9 C)   SpO2: 94%     General: Awake, no distress.  CV:  Good peripheral perfusion.  Resp:  Normal effort.  Abd:  No distention.  Soft with suprapubic pain and fullness Other:  No lower extremity edema, no pallor   ED Results / Procedures / Treatments   Labs (all labs ordered are listed, but only abnormal results are displayed) Labs Reviewed  COMPREHENSIVE METABOLIC PANEL - Abnormal; Notable for the following components:      Result Value   CO2 19 (*)    Glucose, Bld 130 (*)    All other components within normal limits  CBC WITH DIFFERENTIAL/PLATELET  URINALYSIS, ROUTINE W REFLEX MICROSCOPIC  TYPE AND SCREEN     EKG    RADIOLOGY CT abdomen pelvis interpreted by me, shows distended bladder with urine and a focal area of  hemorrhage.   PROCEDURES:  Procedures   MEDICATIONS ORDERED IN ED: Medications  sodium chloride irrigation 0.9 % 3,000 mL (3,000 mLs Irrigation New Bag/Given 05/06/22 0143)  iohexol (OMNIPAQUE) 300 MG/ML solution 100 mL (100 mLs Intravenous Contrast Given 05/05/22 2332)  morphine (PF) 4 MG/ML injection 4 mg (4 mg Intravenous Given 05/06/22 0054)     IMPRESSION / MDM / ASSESSMENT AND PLAN / ED COURSE  I reviewed the triage vital signs and the nursing notes.                              Differential diagnosis includes, but is not limited to, ureterolithiasis, bladder hemorrhage, bladder malignancy, anemia, electrolyte abnormality, AKI  Patient's presentation is most consistent with acute presentation with potential threat to life or bodily function.  Patient presents with gross hematuria.  Labs unremarkable.  Imaging reveals a bladder hemorrhage.  Case d/w Dr. Bernardo Heater, who agrees with CBI to see if bleeding will  stop and clear quickly.    Three-way Foley catheter inserted for CBI.  After 3 L of irrigation, patient had immediate return to frank hematuria with irrigation was paused.  Case discussed with hospitalist for further management pending urological evaluation.       FINAL CLINICAL IMPRESSION(S) / ED DIAGNOSES   Final diagnoses:  Bladder hemorrhage     Rx / DC Orders   ED Discharge Orders     None        Note:  This document was prepared using Dragon voice recognition software and may include unintentional dictation errors.   Carrie Mew, MD 05/06/22 [phone]

## 2022-05-06 NOTE — Consult Note (Signed)
Urology Consult  Requesting physician: Judd Gaudier, MD  Reason for consultation: Gross hematuria  Chief Complaint: Blood in urine  History of Present Illness: Scott Spence is a 71 y.o. male s/p HoLEP by Dr. Diamantina Providence 03/09/2022 with prostate volume 40+ grams with incidental finding of low volume Gleason 3+3 adenocarcinoma.  Catheter was removed 3 days postop and he was doing well until yesterday when he presented to the ED last night with acute onset of total gross painless hematuria and subsequent urinary retention.  Takes ASA 81 mg daily but no anticoagulant/antiplatelet medication.  CT abdomen/pelvis with and without contrast was performed which showed bilateral, nonobstructing renal calculi.  No other upper tract abnormalities or hydronephrosis.  Clot was noted in the bladder with bladder distention.  A 20 French three-way Foley catheter was placed and irrigated and he was started on CBI.  He was admitted to the hospitalist service secondary to continued hematuria  No complaints since the catheter was placed.  Hgb last night 15.5 and 13.8 this morning.  Past Medical History:  Diagnosis Date   BPH with urinary obstruction    Chronic kidney disease    hemorrhagic nephritis as a child   Complication of anesthesia    collapsed lung with local for LUE surgery   GERD (gastroesophageal reflux disease)    Gout    History of kidney stones    Hypertension     Past Surgical History:  Procedure Laterality Date   APPENDECTOMY     FOOT ARTHRODESIS Right 03/29/2022   Procedure: SUBTALAR ARTHRODESIS;  Surgeon: Wylene Simmer, MD;  Location: [place];  Service: Orthopedics;  Laterality: Right;   HERNIA REPAIR     HOLEP-LASER ENUCLEATION OF THE PROSTATE WITH MORCELLATION N/A 03/09/2022   Procedure: HOLEP-LASER ENUCLEATION OF THE PROSTATE WITH MORCELLATION;  Surgeon: Billey Co, MD;  Location: [facility];  Service: Urology;  Laterality: N/A;   KIDNEY STONE SURGERY      PROSTATECTOMY     TOTAL HIP ARTHROPLASTY Right 04/01/2018   Procedure: TOTAL HIP ARTHROPLASTY ANTERIOR APPROACH;  Surgeon: Hessie Knows, MD;  Location: [facility];  Service: Orthopedics;  Laterality: Right;    Home Medications:  No outpatient medications have been marked as taking for the 05/05/22 encounter [facility] Encounter).    Allergies:  Allergies  Allergen Reactions   Meperidine Other (See Comments)    GI Upset   Sulfa Antibiotics Hives   Gabapentin Other (See Comments)    Migraine HA   Lisinopril Cough    Family History  Problem Relation Age of Onset   Prostate cancer Neg Hx    Bladder Cancer Neg Hx    Kidney cancer Neg Hx     Social History:  reports that he has never smoked. He has never been exposed to tobacco smoke. He has never used smokeless tobacco. He reports current alcohol use of about 14.0 standard drinks of alcohol per week. He reports that he does not use drugs.  ROS: A complete review of systems was performed.  All systems are negative except for pertinent findings as noted.  Physical Exam:  Vital signs in last 24 hours: Temp:  [97.8 F (36.6 C)-98.4 F (36.9 C)] 97.9 F (36.6 C) (10/15 0913) Pulse Rate:  [77-103] 77 (10/15 0913) Resp:  [16-18] 18 (10/15 0913) BP: (135-168)/(82-126) 160/93 (10/15 0913) SpO2:  [93 %-96 %] 93 % (10/15 0913) Weight:  [79.4 kg-81.3 kg] 81.3 kg (10/15 0913) Constitutional:  Alert and oriented, No acute distress HEENT: [place]  AT, moist mucus membranes.  Trachea midline Respiratory: Normal respiratory effort GU: CBI effluent pink-tinged on moderate inflow.  The catheter was irrigated with a [place] syringe and only a few small clots were obtained Psychiatric: Normal mood and affect   Laboratory Data:  Recent Labs    05/05/22 2207 05/06/22 1112  WBC 9.8 8.0  HGB 15.5 13.8  HCT 45.6 40.9   Recent Labs    05/05/22 2207  NA 139  K 4.0  CL 110  CO2 19*  GLUCOSE 130*  BUN 21  CREATININE 1.08  CALCIUM 9.7   No  results for input(s): "LABPT", "INR" in the last 72 hours. No results for input(s): "LABURIN" in the last 72 hours. Results for orders placed or performed in visit on 02/28/22  CULTURE, URINE COMPREHENSIVE     Status: None   Collection Time: 02/28/22  1:26 PM   Specimen: Urine   UR  Result Value Ref Range Status   Urine Culture, Comprehensive Final report  Final   Organism ID, Bacteria Comment  Final    Comment: No growth in 36 - 48 hours.  Microscopic Examination     Status: Abnormal   Collection Time: 02/28/22  1:26 PM   Urine  Result Value Ref Range Status   WBC, UA 0-5 0 - 5 /hpf Final   RBC, Urine 0-2 0 - 2 /hpf Final   Epithelial Cells (non renal) 0-10 0 - 10 /hpf Final   Casts Present (A) None seen /lpf Final   Cast Type Granular casts (A) N/A Final    Comment: Hyaline casts   Mucus, UA Present (A) Not Estab. Final   Bacteria, UA Few None seen/Few Final     Radiologic Imaging: CT images were personally reviewed and interpreted  CT ABDOMEN PELVIS W WO CONTRAST  Result Date: 05/06/2022 CLINICAL DATA:  Hematuria, status post prostatectomy EXAM: CT ABDOMEN AND PELVIS WITHOUT AND WITH CONTRAST TECHNIQUE: Multidetector CT imaging of the abdomen and pelvis was performed following the standard protocol before and following the bolus administration of intravenous contrast. RADIATION DOSE REDUCTION: This exam was performed according to the departmental dose-optimization program which includes automated exposure control, adjustment of the mA and/or kV according to patient size and/or use of iterative reconstruction technique. CONTRAST:  151m OMNIPAQUE IOHEXOL 300 MG/ML  SOLN COMPARISON:  None Available. FINDINGS: Lower chest: Lung bases are clear. Hepatobiliary: Unenhanced liver is unremarkable. Gallbladder is unremarkable. No intrahepatic or extrahepatic duct dilatation. Pancreas: Within normal limits. Spleen: Within normal limits. Adrenals/Urinary Tract: Adrenal glands are within  normal limits. Multiple bilateral nonobstructing renal calculi measuring up to 2 mm, proximally 4 on the right and 6 on the left. No ureteral calculi or hydronephrosis. Moderate hemorrhage within the bladder (series 2/image 78). Stomach/Bowel: Stomach is within normal limits. No evidence of bowel obstruction. Appendix is not discretely visualized. No colonic wall thickening or inflammatory changes. Vascular/Lymphatic: No evidence of abdominal aortic aneurysm. Atherosclerotic calcifications of the abdominal aorta and branch vessels. No suspicious abdominopelvic lymphadenopathy. Reproductive: Status post prostatectomy. Other: No abdominopelvic ascites. Musculoskeletal: Degenerative changes of the visualized thoracolumbar spine. Right hip arthroplasty, without evidence of complication. IMPRESSION: Moderate hemorrhage within the bladder. Consider cystoscopy as clinically warranted. Multiple bilateral nonobstructing renal calculi measuring up to 2 mm. No ureteral calculi or hydronephrosis. Status post prostatectomy. Electronically Signed   By: SJulian HyM.D.   On: 05/06/2022 00:00    Assessment:  Gross hematuria with clot retention. Uncommon occurrence 2 months postop  Recommendation:  Continue CBI today Will recheck in a.m. and if CBI pink-clear will stop CBI and DC Foley a few hours after stopping CBI if he does not have significant recurrent hematuria   05/06/2022, 12:56 PM  John Giovanni,  MD

## 2022-05-06 NOTE — ED Notes (Signed)
The pt was resting comfortably in room. The pt's vitals were assessed. The pt denied having any further needs.

## 2022-05-06 NOTE — ED Notes (Signed)
Advised nurse that patient has ready bed 

## 2022-05-06 NOTE — TOC Initial Note (Signed)
Transition of Care [facility]) - Initial/Assessment Note    Patient Details  Name: Scott Spence MRN: [id number] Date of Birth: 1951/03/30  Transition of Care [facility]) CM/SW Contact:    Beverly Sessions, RN Phone Number: 05/06/2022, 1:11 PM  Clinical Narrative:                     Transition of Care [facility]) Screening Note   Patient Details  Name: Scott Spence Date of Birth: 1951-01-21   Transition of Care [facility]) CM/SW Contact:    Beverly Sessions, RN Phone Number: 05/06/2022, 1:11 PM    Transition of Care Department [facility]) has reviewed patient and no TOC needs have been identified at this time. We will continue to monitor patient advancement through interdisciplinary progression rounds. If new patient transition needs arise, please place a TOC consult.       Patient Goals and CMS Choice        Expected Discharge Plan and Services                                                Prior Living Arrangements/Services                       Activities of Daily Living Home Assistive Devices/Equipment: Environmental consultant (specify type), Other (Comment) ADL Screening (condition at time of admission) Patient's cognitive ability adequate to safely complete daily activities?: Yes Is the patient deaf or have difficulty hearing?: No Does the patient have difficulty seeing, even when wearing glasses/contacts?: Yes (reading glass at bedside) Does the patient have difficulty concentrating, remembering, or making decisions?: No Patient able to express need for assistance with ADLs?: Yes Does the patient have difficulty dressing or bathing?: No Independently performs ADLs?: Yes (appropriate for developmental age) Does the patient have difficulty walking or climbing stairs?: Yes (uses rolling walker due to indwelling cath.) Weakness of Legs: None Weakness of Arms/Hands: None  Permission Sought/Granted                  Emotional Assessment              Admission  diagnosis:  Bladder hemorrhage [N32.89] Gross hematuria [R31.0] Patient Active Problem List   Diagnosis Date Noted   Gross hematuria 05/06/2022   Acute urinary retention 05/06/2022   BPH S/P HoLEP prostatectomy 02/2022 05/06/2022   Status post total hip replacement, right 04/01/2018   Primary osteoarthritis of right hip 01/15/2018   Serrated adenoma of colon 12/30/2014   Anemia, iron deficiency 07/06/2013   Benign prostatic hyperplasia 07/06/2013   Bilateral sensorineural hearing loss 06/29/2010   Tinnitus 06/20/2010   Gouty arthropathy 05/25/2010   Esophageal reflux 12/27/2001   Essential hypertension 12/27/2001   PCP:  Maryland Pink, MD Pharmacy:   [id number] [place]Phone: [phone]: [phone]Phone: [phone]: [phone]    Social Determinants of Health (SDOH) Interventions    Readmission Risk Interventions     No data to display

## 2022-05-06 NOTE — Care Management Obs Status (Signed)
[place] NOTIFICATION   Patient Details  Name: Scott Spence MRN: [id number] Date of Birth: 1950/09/16   Medicare Observation Status Notification Given:  Yes    Beverly Sessions, RN 05/06/2022, 3:33 PM

## 2022-05-06 NOTE — Assessment & Plan Note (Addendum)
BPH s/p HoLEP prostatectomy 02/2022 Acute urinary retention Continue CBI Urology consult Pain control Hold aspirin

## 2022-05-06 NOTE — H&P (Signed)
History and Physical    Patient: Scott Spence [id number] DOB: 1951/05/22 DOA: 05/05/2022 DOS: the patient was seen and examined on 05/06/2022 PCP: Maryland Pink, MD  Patient coming from: Home  Chief Complaint:  Chief Complaint  Patient presents with   Hematuria    HPI: Scott Spence is a 71 y.o. male with medical history significant for BPH status post HoLEP in August 2023, status post right ankle surgery September 2023 currently on aspirin who presents to the ED with gross hematuria, difficulty voiding and suprapubic discomfort.  The symptoms started at around 6:30 PM on 10/14.  He denied fever or chills.  States he had been doing well since his procedure in August. ED course and data review: BP 139/82 with pulse 103 and otherwise normal vitals CBC and CMP mostly unremarkable.  Urinalysis pending. CT abdomen and pelvis showing the following: IMPRESSION: Moderate hemorrhage within the bladder. Consider cystoscopy as clinically warranted.   Multiple bilateral nonobstructing renal calculi measuring up to 2 mm. No ureteral calculi or hydronephrosis.   Status post prostatectomy.  Patient was treated with 3 L of continuous bladder irrigation but continued to have pinkish-red drainage.  The ED provider spoke with urologist, Dr. Bernardo Heater who recommended admission to hospitalist service.   Review of Systems: As mentioned in the history of present illness. All other systems reviewed and are negative.  Past Medical History:  Diagnosis Date   BPH with urinary obstruction    Chronic kidney disease    hemorrhagic nephritis as a child   Complication of anesthesia    collapsed lung with local for LUE surgery   GERD (gastroesophageal reflux disease)    Gout    History of kidney stones    Hypertension    Past Surgical History:  Procedure Laterality Date   APPENDECTOMY     FOOT ARTHRODESIS Right 03/29/2022   Procedure: SUBTALAR ARTHRODESIS;  Surgeon: Wylene Simmer, MD;  Location: [place];  Service: Orthopedics;  Laterality: Right;   HERNIA REPAIR     HOLEP-LASER ENUCLEATION OF THE PROSTATE WITH MORCELLATION N/A 03/09/2022   Procedure: HOLEP-LASER ENUCLEATION OF THE PROSTATE WITH MORCELLATION;  Surgeon: Billey Co, MD;  Location: [facility];  Service: Urology;  Laterality: N/A;   KIDNEY STONE SURGERY     PROSTATECTOMY     TOTAL HIP ARTHROPLASTY Right 04/01/2018   Procedure: TOTAL HIP ARTHROPLASTY ANTERIOR APPROACH;  Surgeon: Hessie Knows, MD;  Location: [facility];  Service: Orthopedics;  Laterality: Right;   Social History:  reports that he has never smoked. He has never been exposed to tobacco smoke. He has never used smokeless tobacco. He reports current alcohol use of about 14.0 standard drinks of alcohol per week. He reports that he does not use drugs.  Allergies  Allergen Reactions   Meperidine Other (See Comments)    GI Upset   Sulfa Antibiotics Hives   Gabapentin Other (See Comments)    Migraine HA   Lisinopril Cough    Family History  Problem Relation Age of Onset   Prostate cancer Neg Hx    Bladder Cancer Neg Hx    Kidney cancer Neg Hx     Prior to Admission medications   Medication Sig Start Date End Date Taking? Authorizing Provider  allopurinol (ZYLOPRIM) 300 MG tablet Take 300 mg by mouth daily.  10/10/16   [provider]  aspirin EC 81 MG tablet Take 1 tablet (81 mg total) by mouth 2 (two) times daily. 03/29/22   Mechele Claude  Pike, PA-C  calcium carbonate (TUMS - DOSED IN MG ELEMENTAL CALCIUM) 500 MG chewable tablet Chew 1 tablet by mouth daily. Patient not taking: Reported on 03/05/2022    [provider]  calcium gluconate 500 MG tablet Take 1 tablet by mouth daily.    [provider]  Coenzyme Q-10 200 MG CAPS Take 200 mg by mouth daily.     [provider]  Cyanocobalamin (VITAMIN B 12 PO) Take 3,000 mcg by mouth daily.     [provider]  docusate sodium (COLACE) 100 MG capsule  Take 1 capsule (100 mg total) by mouth 2 (two) times daily. While taking narcotic pain medicine. 03/29/22   Corky Sing, PA-C  Ergocalciferol (VITAMIN D2) 2000 units TABS Take 2,000 Units by mouth daily.    [provider]  Glucosamine-Chondroit-Vit C-Mn (GLUCOSAMINE 1500 COMPLEX PO) Take 1,500 mg by mouth daily.    [provider]  ibuprofen (ADVIL) 200 MG tablet Take 200 mg by mouth daily.    [provider]  loratadine (CLARITIN) 10 MG tablet Take 10 mg by mouth daily.    [provider]  losartan (COZAAR) 100 MG tablet Take 100 mg by mouth daily.  10/10/16   [provider]  Magnesium 250 MG TABS Take 250 mg by mouth daily.    [provider]  mometasone (NASONEX) 50 MCG/ACT nasal spray Place 1 spray into the nose 2 (two) times daily.  10/10/16   [provider]  Multiple Vitamin (MULTIVITAMIN) capsule Take 1 capsule by mouth daily.     [provider]  omeprazole (PRILOSEC) 20 MG capsule Take 20 mg by mouth daily.  10/10/16   [provider]  oxyCODONE (ROXICODONE) 5 MG immediate release tablet Take 1 tablet (5 mg total) by mouth every 4 (four) hours as needed for severe pain. 03/29/22   Corky Sing, PA-C  RESTASIS 0.05 % ophthalmic emulsion Place 2 drops into both eyes 2 (two) times daily.  06/28/17   [provider]  rosuvastatin (CRESTOR) 5 MG tablet Take 5 mg by mouth daily. 11/30/19   [provider]  senna (SENOKOT) 8.6 MG TABS tablet Take 2 tablets (17.2 mg total) by mouth 2 (two) times daily. 03/29/22   Corky Sing, PA-C    Physical Exam: Vitals:   05/05/22 2147 05/05/22 2149 05/05/22 2152 05/06/22 0309  BP:   (!) 168/126 139/82  Pulse: (!) 103   99  Resp: 18   16  Temp: 98.4 F (36.9 C)   98.2 F (36.8 C)  TempSrc: Oral   Oral  SpO2: 94%   94%  Weight:  79.4 kg     Physical Exam Vitals and nursing note reviewed.  Constitutional:      General: He is not in acute  distress. HENT:     Head: Normocephalic and atraumatic.  Cardiovascular:     Rate and Rhythm: Normal rate and regular rhythm.     Heart sounds: Normal heart sounds.  Pulmonary:     Effort: Pulmonary effort is normal.     Breath sounds: Normal breath sounds.  Abdominal:     Palpations: Abdomen is soft.     Tenderness: There is no abdominal tenderness.  Musculoskeletal:     Comments: Right ankle in cast  Neurological:     Mental Status: Mental status is at baseline.     Labs on Admission: I have personally reviewed following labs and imaging studies  CBC: Recent Labs  Lab 05/05/22 2207  WBC 9.8  NEUTROABS 5.5  HGB 15.5  HCT 45.6  MCV 94.2  PLT 960   Basic Metabolic Panel: Recent Labs  Lab 05/05/22 2207  NA 139  K 4.0  CL 110  CO2 19*  GLUCOSE 130*  BUN 21  CREATININE 1.08  CALCIUM 9.7   GFR: Estimated Creatinine Clearance: 59.7 mL/min (by C-G formula based on SCr of 1.08 mg/dL). Liver Function Tests: Recent Labs  Lab 05/05/22 2207  AST 29  ALT 27  ALKPHOS 90  BILITOT 0.8  PROT 7.5  ALBUMIN 4.5   No results for input(s): "LIPASE", "AMYLASE" in the last 168 hours. No results for input(s): "AMMONIA" in the last 168 hours. Coagulation Profile: No results for input(s): "INR", "PROTIME" in the last 168 hours. Cardiac Enzymes: No results for input(s): "CKTOTAL", "CKMB", "CKMBINDEX", "TROPONINI" in the last 168 hours. BNP (last 3 results) No results for input(s): "PROBNP" in the last 8760 hours. HbA1C: No results for input(s): "HGBA1C" in the last 72 hours. CBG: No results for input(s): "GLUCAP" in the last 168 hours. Lipid Profile: No results for input(s): "CHOL", "HDL", "LDLCALC", "TRIG", "CHOLHDL", "LDLDIRECT" in the last 72 hours. Thyroid Function Tests: No results for input(s): "TSH", "T4TOTAL", "FREET4", "T3FREE", "THYROIDAB" in the last 72 hours. Anemia Panel: No results for input(s): "VITAMINB12", "FOLATE", "FERRITIN", "TIBC", "IRON",  "RETICCTPCT" in the last 72 hours. Urine analysis:    Component Value Date/Time   COLORURINE YELLOW (A) 03/17/2018 1329   APPEARANCEUR Clear 02/28/2022 1326   LABSPEC 1.018 03/17/2018 1329   PHURINE 5.0 03/17/2018 1329   GLUCOSEU Negative 02/28/2022 1326   HGBUR NEGATIVE 03/17/2018 1329   BILIRUBINUR Negative 02/28/2022 1326   KETONESUR NEGATIVE 03/17/2018 1329   PROTEINUR 1+ (A) 02/28/2022 1326   PROTEINUR NEGATIVE 03/17/2018 1329   NITRITE Negative 02/28/2022 1326   NITRITE NEGATIVE 03/17/2018 1329   LEUKOCYTESUR Negative 02/28/2022 1326    Radiological Exams on Admission: CT ABDOMEN PELVIS W WO CONTRAST  Result Date: 05/06/2022 CLINICAL DATA:  Hematuria, status post prostatectomy EXAM: CT ABDOMEN AND PELVIS WITHOUT AND WITH CONTRAST TECHNIQUE: Multidetector CT imaging of the abdomen and pelvis was performed following the standard protocol before and following the bolus administration of intravenous contrast. RADIATION DOSE REDUCTION: This exam was performed according to the departmental dose-optimization program which includes automated exposure control, adjustment of the mA and/or kV according to patient size and/or use of iterative reconstruction technique. CONTRAST:  137m OMNIPAQUE IOHEXOL 300 MG/ML  SOLN COMPARISON:  None Available. FINDINGS: Lower chest: Lung bases are clear. Hepatobiliary: Unenhanced liver is unremarkable. Gallbladder is unremarkable. No intrahepatic or extrahepatic duct dilatation. Pancreas: Within normal limits. Spleen: Within normal limits. Adrenals/Urinary Tract: Adrenal glands are within normal limits. Multiple bilateral nonobstructing renal calculi measuring up to 2 mm, proximally 4 on the right and 6 on the left. No ureteral calculi or hydronephrosis. Moderate hemorrhage within the bladder (series 2/image 78). Stomach/Bowel: Stomach is within normal limits. No evidence of bowel obstruction. Appendix is not discretely visualized. No colonic wall thickening or  inflammatory changes. Vascular/Lymphatic: No evidence of abdominal aortic aneurysm. Atherosclerotic calcifications of the abdominal aorta and branch vessels. No suspicious abdominopelvic lymphadenopathy. Reproductive: Status post prostatectomy. Other: No abdominopelvic ascites. Musculoskeletal: Degenerative changes of the visualized thoracolumbar spine. Right hip arthroplasty, without evidence of complication. IMPRESSION: Moderate hemorrhage within the bladder. Consider cystoscopy as clinically warranted. Multiple bilateral nonobstructing renal calculi measuring up to 2 mm. No ureteral calculi or hydronephrosis. Status post prostatectomy. Electronically Signed  By: Julian Hy M.D.   On: 05/06/2022 00:00     Data Reviewed: Relevant notes from primary care and specialist visits, past discharge summaries as available in EHR, including Care Everywhere. Prior diagnostic testing as pertinent to current admission diagnoses Updated medications and problem lists for reconciliation ED course, including vitals, labs, imaging, treatment and response to treatment Triage notes, nursing and pharmacy notes and ED provider's notes Notable results as noted in HPI   Assessment and Plan: * Gross hematuria BPH s/p HoLEP prostatectomy 02/2022 Acute urinary retention Continue CBI Urology consult Pain control Hold aspirin        DVT prophylaxis: SCD  Consults: Urology  Advance Care Planning:   Code Status: Prior   Family Communication: none  Disposition Plan: Back to previous home environment  Severity of Illness: The appropriate patient status for this patient is OBSERVATION. Observation status is judged to be reasonable and necessary in order to provide the required intensity of service to ensure the patient's safety. The patient's presenting symptoms, physical exam findings, and initial radiographic and laboratory data in the context of their medical condition is felt to place them at  decreased risk for further clinical deterioration. Furthermore, it is anticipated that the patient will be medically stable for discharge from the hospital within 2 midnights of admission.   Author: Athena Masse, MD 05/06/2022 3:22 AM  For on call review [email or web address].

## 2022-05-06 NOTE — Hospital Course (Addendum)
Taken from H&P.  Scott Spence is a 71 y.o. male with medical history significant for BPH status post HoLEP in August 2023, status post right ankle surgery September 2023 currently on aspirin who presents to the ED with gross hematuria, difficulty voiding and suprapubic discomfort.  The symptoms started at around 6:30 PM on 10/14.  He denied fever or chills.  States he had been doing well since his procedure in August. ED course and data review: BP 139/82 with pulse 103 and otherwise normal vitals. CBC and CMP mostly unremarkable.  Urinalysis pending. CT abdomen and pelvis showing the following: IMPRESSION: Moderate hemorrhage within the bladder. Consider cystoscopy as clinically warranted.  Multiple bilateral nonobstructing renal calculi measuring up to 2 mm. No ureteral calculi or hydronephrosis.  Status post prostatectomy.   Patient was treated with 3 L of continuous bladder irrigation but continued to have pinkish-red drainage.  Urology was consulted.

## 2022-05-07 ENCOUNTER — Inpatient Hospital Stay: Payer: Medicare Other

## 2022-05-07 DIAGNOSIS — R319 Hematuria, unspecified: Secondary | ICD-10-CM | POA: Diagnosis present

## 2022-05-07 DIAGNOSIS — R310 Gross hematuria: Secondary | ICD-10-CM | POA: Diagnosis not present

## 2022-05-07 DIAGNOSIS — R339 Retention of urine, unspecified: Secondary | ICD-10-CM | POA: Diagnosis not present

## 2022-05-07 LAB — CBC
HCT: 41.6 % (ref 39.0–52.0)
HCT: 42.1 % (ref 39.0–52.0)
Hemoglobin: 14.1 g/dL (ref 13.0–17.0)
Hemoglobin: 14.2 g/dL (ref 13.0–17.0)
MCH: 32.4 pg (ref 26.0–34.0)
MCH: 31.6 pg (ref 26.0–34.0)
MCHC: 33.9 g/dL (ref 30.0–36.0)
MCHC: 33.7 g/dL (ref 30.0–36.0)
MCV: 95.6 fL (ref 80.0–100.0)
MCV: 93.8 fL (ref 80.0–100.0)
Platelets: 151 10*3/uL (ref 150–400)
Platelets: 155 10*3/uL (ref 150–400)
RBC: 4.35 MIL/uL (ref 4.22–5.81)
RBC: 4.49 MIL/uL (ref 4.22–5.81)
RDW: 12.7 % (ref 11.5–15.5)
RDW: 12.8 % (ref 11.5–15.5)
WBC: 10.9 10*3/uL — ABNORMAL HIGH (ref 4.0–10.5)
WBC: 8.9 10*3/uL (ref 4.0–10.5)
nRBC: 0 % (ref 0.0–0.2)
nRBC: 0 % (ref 0.0–0.2)

## 2022-05-07 LAB — PROCALCITONIN: Procalcitonin: <0.1 ng/mL

## 2022-05-07 MED ORDER — CYCLOSPORINE 0.05 % OP EMUL
2.0000 [drp] | Freq: Two times a day (BID) | OPHTHALMIC | Status: DC
  Filled 2022-05-07 (×2): qty 30

## 2022-05-07 MED ORDER — GLUCOSAMINE 1500 COMPLEX PO CAPS: ORAL_CAPSULE | Freq: Every day | ORAL | Status: DC

## 2022-05-07 MED ORDER — COENZYME Q-10 200 MG PO CAPS: 200.0000 mg | ORAL_CAPSULE | Freq: Every day | ORAL | Status: DC

## 2022-05-07 MED ORDER — SENNA 8.6 MG PO TABS: 2.0000 | ORAL_TABLET | Freq: Two times a day (BID) | ORAL | Status: DC | PRN

## 2022-05-07 MED ORDER — LOSARTAN POTASSIUM 50 MG PO TABS
100.0000 mg | ORAL_TABLET | Freq: Every day | ORAL | Status: DC
  Administered 2022-05-07: 100 mg via ORAL
  Filled 2022-05-07: qty 2

## 2022-05-07 MED ORDER — ROSUVASTATIN CALCIUM 10 MG PO TABS: 5.0000 mg | ORAL_TABLET | Freq: Every evening | ORAL | Status: DC

## 2022-05-07 MED ORDER — PANTOPRAZOLE SODIUM 40 MG PO TBEC
40.0000 mg | DELAYED_RELEASE_TABLET | Freq: Every day | ORAL | Status: DC
  Administered 2022-05-07: 40 mg via ORAL
  Filled 2022-05-07: qty 1

## 2022-05-07 MED ORDER — CALCIUM CARBONATE ANTACID 500 MG PO CHEW
1.0000 | CHEWABLE_TABLET | Freq: Every day | ORAL | Status: DC
  Filled 2022-05-07: qty 1

## 2022-05-07 MED ORDER — CEFUROXIME AXETIL 500 MG PO TABS: 500.0000 mg | ORAL_TABLET | Freq: Two times a day (BID) | ORAL | 0 refills | Status: AC

## 2022-05-07 MED ORDER — LORATADINE 10 MG PO TABS: 10.0000 mg | ORAL_TABLET | Freq: Every day | ORAL | Status: DC | PRN

## 2022-05-07 MED ORDER — CALCIUM GLUCONATE 500 MG PO TABS: 1.0000 | ORAL_TABLET | Freq: Every day | ORAL | Status: DC

## 2022-05-07 MED ORDER — FLUTICASONE PROPIONATE 50 MCG/ACT NA SUSP: 1.0000 | Freq: Every day | NASAL | Status: DC | PRN

## 2022-05-07 MED ORDER — OXYBUTYNIN CHLORIDE 5 MG PO TABS: 5.0000 mg | ORAL_TABLET | Freq: Three times a day (TID) | ORAL | 0 refills | Status: AC | PRN

## 2022-05-07 MED ORDER — VITAMIN B 12 250 MCG PO LOZG: 3000.0000 ug | LOZENGE | Freq: Every day | ORAL | Status: DC

## 2022-05-07 MED ORDER — DOCUSATE SODIUM 100 MG PO CAPS: 100.0000 mg | ORAL_CAPSULE | Freq: Two times a day (BID) | ORAL | Status: DC | PRN

## 2022-05-07 MED ORDER — ADULT MULTIVITAMIN W/MINERALS CH
1.0000 | ORAL_TABLET | Freq: Every day | ORAL | Status: DC
  Administered 2022-05-07: 1 via ORAL
  Filled 2022-05-07: qty 1

## 2022-05-07 MED ORDER — MAGNESIUM OXIDE -MG SUPPLEMENT 400 (240 MG) MG PO TABS
400.0000 mg | ORAL_TABLET | Freq: Every day | ORAL | Status: DC
  Administered 2022-05-07: 400 mg via ORAL
  Filled 2022-05-07: qty 1

## 2022-05-07 MED ORDER — VITAMIN D 25 MCG (1000 UNIT) PO TABS
2000.0000 [IU] | ORAL_TABLET | Freq: Every day | ORAL | Status: DC
  Administered 2022-05-07: 2000 [IU] via ORAL
  Filled 2022-05-07: qty 2

## 2022-05-07 MED ORDER — SODIUM CHLORIDE 0.9 % IV SOLN
1.0000 g | INTRAVENOUS | Status: DC
  Administered 2022-05-07: 1 g via INTRAVENOUS
  Filled 2022-05-07: qty 1

## 2022-05-07 NOTE — Progress Notes (Signed)
Progress Note   Patient: Scott Spence WEX:[id number] DOB: 30-Oct-1950 DOA: 05/05/2022     1 DOS: the patient was seen and examined on 05/07/2022   Brief hospital course: Taken from H&P.  Scott Spence is a 71 y.o. male with medical history significant for BPH status post HoLEP in August 2023, status post right ankle surgery September 2023 currently on aspirin who presents to the ED with gross hematuria, difficulty voiding and suprapubic discomfort.  The symptoms started at around 6:30 PM on 10/14.  He denied fever or chills.  States he had been doing well since his procedure in August. ED course and data review: BP 139/82 with pulse 103 and otherwise normal vitals. CBC and CMP mostly unremarkable.  Urinalysis pending. CT abdomen and pelvis showing the following: IMPRESSION: Moderate hemorrhage within the bladder. Consider cystoscopy as clinically warranted.  Multiple bilateral nonobstructing renal calculi measuring up to 2 mm. No ureteral calculi or hydronephrosis.  Status post prostatectomy.   Patient was treated with 3 L of continuous bladder irrigation but continued to have pinkish-red drainage.  Urology was consulted.  10/16: Patient still continues to have hematuria, urologist following and they have continued CBI.  Pelvic ultrasound has been ordered currently pending.  Hemoglobin hemodynamic stable.  Trending up of leukocytosis with mild positive procalcitonin hence started on empiric antibiotic therapy with ceftriaxone given that the patient had recent orthopedic surgery with hardware.   Assessment and Plan: * Gross hematuria BPH s/p HoLEP prostatectomy 02/2022 Acute urinary retention -Urologist evaluated the patient and there was some improvement initially with urine turning light pink. - However again it started to become red - Urologist following advised to continue CBI - Pelvic ultrasound has been ordered. - Patient's hemoglobin hemodynamics currently stable. - There was some  mild trending up of leukocytosis with some mildly elevated procalcitonin and with the recent orthopedic surgery empiric antibiotics of ceftriaxone has been initiated. - Continue to hold aspirin.           Subjective: Patient was seen and examined at bedside today.  Patient was sleeping easily arousable.  He denies having any pain fevers and his only worry is about hematuria.  Physical Exam: Vitals:   05/06/22 2003 05/07/22 0530 05/07/22 0823 05/07/22 1147  BP: 134/77 129/83 (!) 157/81 136/80  Pulse: 83 71 71   Resp: '17 17 16   '$ Temp: 98.1 F (36.7 C) 97.9 F (36.6 C) (!) 97.5 F (36.4 C)   TempSrc:   Oral   SpO2: 96% 93% 97%   Weight:      Height:       Constitutional:      General: He is not in acute distress. HENT:     Head: Normocephalic and atraumatic.  Cardiovascular:     Rate and Rhythm: Normal rate and regular rhythm.     Heart sounds: Normal heart sounds.  Pulmonary:     Effort: Pulmonary effort is normal.     Breath sounds: Normal breath sounds.  Abdominal:     Palpations: Abdomen is soft.     Tenderness: There is no abdominal tenderness.  Genitourinary: Foley catheter in place draining reddish-pink urine Musculoskeletal:     Comments: Right ankle in cast  Neurological:     Mental Status: Mental status is at baseline.   Data Reviewed:  There are no new results to review at this time.  Repeat hemoglobin is 14.1, WBC count increased to 10.9.  Family Communication: Patient is alert and oriented  Disposition: Status is:  Inpatient Remains inpatient appropriate because: Patient still continues to have hematuria.  Planned Discharge Destination: Home    Time spent: 35 minutes  Author: Carlyle Lipa, MD 05/07/2022 4:41 PM  For on call review [email or web address].

## 2022-05-07 NOTE — Progress Notes (Signed)
Pt discharged per MD order. IV removed. Discharge instructions reviewed with pt. Pt verbalized understanding. Foley education provided by Zara Council, PA. Pt wheeled out in wheelchair by staff.

## 2022-05-07 NOTE — Progress Notes (Signed)
Urology Consult Follow Up  Subjective: Patient states that he has had a couple of severe bladder spasms during the night secondary to obstruction of the catheter by clot material.  VSS, afebrile   WBC count 10.9, hemoglobin 14.1 which is up from 13.6 yesterday and hematocrit of 41.6 which is up from 39/9.    Anti-infectives: Anti-infectives (From admission, onward)    None       Current Facility-Administered Medications  Medication Dose Route Frequency Provider Last Rate Last Admin   acetaminophen (TYLENOL) tablet 650 mg  650 mg Oral Q6H PRN Athena Masse, MD   650 mg at 05/07/22 0532   Or   acetaminophen (TYLENOL) suppository 650 mg  650 mg Rectal Q6H PRN Athena Masse, MD       morphine (PF) 2 MG/ML injection 2 mg  2 mg Intravenous Q2H PRN Athena Masse, MD   2 mg at 05/07/22 0245   ondansetron (ZOFRAN) tablet 4 mg  4 mg Oral Q6H PRN Athena Masse, MD       Or   ondansetron [facility]) injection 4 mg  4 mg Intravenous Q6H PRN Athena Masse, MD       oxybutynin (DITROPAN) tablet 5 mg  5 mg Oral Q8H PRN Abbie Sons, MD   5 mg at 05/06/22 2251   oxyCODONE (Oxy IR/ROXICODONE) immediate release tablet 5 mg  5 mg Oral Q4H PRN Athena Masse, MD       sodium chloride irrigation 0.9 % 3,000 mL  3,000 mL Irrigation Continuous Carrie Mew, MD   3,000 mL at 05/07/22 0530     Objective: Vital signs in last 24 hours: Temp:  [97.5 F (36.4 C)-98.1 F (36.7 C)] 97.5 F (36.4 C) (10/16 0823) Pulse Rate:  [71-89] 71 (10/16 0823) Resp:  [16-19] 16 (10/16 0823) BP: (129-160)/(77-93) 157/81 (10/16 0823) SpO2:  [93 %-97 %] 97 % (10/16 0823) Weight:  [81.3 kg] 81.3 kg (10/15 0913)  Intake/Output from previous day: 10/15 0701 - 10/16 0700 In: 27000  Out: 79892 [Urine:46400] Intake/Output this shift: No intake/output data recorded.   Physical Exam Constitutional:  Well nourished. Alert and oriented, No acute distress. HEENT: [place] AT, moist mucus membranes.  Trachea  midline Cardiovascular: No clubbing, cyanosis, or edema. Respiratory: Normal respiratory effort, no increased work of breathing. GU: No CVA tenderness.  No bladder fullness or masses.  Patient 3-way Foley in place draining dark pink urine.  Scrotum without lesions, cysts, rashes and/or edema.   Neurologic: Grossly intact, no focal deficits, moving all 4 extremities. Psychiatric: Normal mood and affect.   Lab Results:  Recent Labs    05/06/22 1729 05/07/22 0520  WBC 10.2 10.9*  HGB 13.6 14.1  HCT 39.9 41.6  PLT 152 151   BMET Recent Labs    05/05/22 2207  NA 139  K 4.0  CL 110  CO2 19*  GLUCOSE 130*  BUN 21  CREATININE 1.08  CALCIUM 9.7   PT/INR No results for input(s): "LABPROT", "INR" in the last 72 hours. ABG No results for input(s): "PHART", "HCO3" in the last 72 hours.  Invalid input(s): "PCO2", "PO2"  Studies/Results: CT ABDOMEN PELVIS W WO CONTRAST  Result Date: 05/06/2022 CLINICAL DATA:  Hematuria, status post prostatectomy EXAM: CT ABDOMEN AND PELVIS WITHOUT AND WITH CONTRAST TECHNIQUE: Multidetector CT imaging of the abdomen and pelvis was performed following the standard protocol before and following the bolus administration of intravenous contrast. RADIATION DOSE REDUCTION: This exam was performed according  to the departmental dose-optimization program which includes automated exposure control, adjustment of the mA and/or kV according to patient size and/or use of iterative reconstruction technique. CONTRAST:  162m OMNIPAQUE IOHEXOL 300 MG/ML  SOLN COMPARISON:  None Available. FINDINGS: Lower chest: Lung bases are clear. Hepatobiliary: Unenhanced liver is unremarkable. Gallbladder is unremarkable. No intrahepatic or extrahepatic duct dilatation. Pancreas: Within normal limits. Spleen: Within normal limits. Adrenals/Urinary Tract: Adrenal glands are within normal limits. Multiple bilateral nonobstructing renal calculi measuring up to 2 mm, proximally 4 on the right  and 6 on the left. No ureteral calculi or hydronephrosis. Moderate hemorrhage within the bladder (series 2/image 78). Stomach/Bowel: Stomach is within normal limits. No evidence of bowel obstruction. Appendix is not discretely visualized. No colonic wall thickening or inflammatory changes. Vascular/Lymphatic: No evidence of abdominal aortic aneurysm. Atherosclerotic calcifications of the abdominal aorta and branch vessels. No suspicious abdominopelvic lymphadenopathy. Reproductive: Status post prostatectomy. Other: No abdominopelvic ascites. Musculoskeletal: Degenerative changes of the visualized thoracolumbar spine. Right hip arthroplasty, without evidence of complication. IMPRESSION: Moderate hemorrhage within the bladder. Consider cystoscopy as clinically warranted. Multiple bilateral nonobstructing renal calculi measuring up to 2 mm. No ureteral calculi or hydronephrosis. Status post prostatectomy. Electronically Signed   By: SJulian HyM.D.   On: 05/06/2022 00:00     Assessment and Plan: 71year old male who is status post HoLEP on 03/09/2022 who presented to the urgency department and clot retention.  CT abdomen/pelvis with and without contrast was performed which showed bilateral, nonobstructing renal calculi.  No other upper tract abnormalities or hydronephrosis.  Clot was noted in the bladder with bladder distention.  Three-way catheter was placed by emergency room staff and he was irrigated and placed on CBI.  Nursing staff stated they had to irrigate a couple times during the night and he also went into bladder spasms secondary to retained clots.     CBI was running on a moderate drip and the urine was a dark pink.  Patient was cleaned and prepped in a sterile fashion. 1800 mL of sterile water was instilled into the bladder with a 776mToomey syringe through the catheter in place.  1800 mL of urine return was cleared from the bladder with evacuation of 50 cc's of old clot material of  small and moderate sizes.  Efflux cleared from cherry red to light pink with the procedure and the catheter irrigated easily. Upon completion, the catheter was draining well and was reattached to the CBI at a slow moderate drip.  Patient tolerated well.  I also placed 10 cc more of sterile water into the 30 cc, 3-way 20 Fr cathter's balloon and place the catheter on traction.    His WBC counts are slightly increasing and with the amount of catheter manipulation, I recommend starting him on a short course of antibiotics.    I will check back this afternoon to see if the urine is clear enough to stop the CBI.       LOS: 1 day    [facility]CC[facility] Association0/16/2023

## 2022-05-07 NOTE — Discharge Summary (Signed)
Physician Discharge Summary   Patient: Scott Spence MRN: [id number] DOB: [birth year]  Admit date:     05/05/2022  Discharge date: 05/07/22  Discharge Physician: Carlyle Lipa   PCP: [place] Pink, MD   Recommendations at discharge:   Follow-up with PCP in 1 week 2.  Follow-up with urologist in 3 days  Discharge Diagnoses: Principal Problem:   Gross hematuria Active Problems:   Acute urinary retention   BPH S/P HoLEP prostatectomy 02/2022   Hematuria   Hospital Course: Taken from H&P.  Scott Spence is a 71 y.o. male with medical history significant for BPH status post HoLEP in August 2023, status post right ankle surgery September 2023 currently on aspirin who presents to the ED with gross hematuria, difficulty voiding and suprapubic discomfort.  The symptoms started at around 6:30 PM on 10/14.  He denied fever or chills.  States he had been doing well since his procedure in August. ED course and data review: BP 139/82 with pulse 103 and otherwise normal vitals. CBC and CMP mostly unremarkable.  Urinalysis pending. CT abdomen and pelvis showing the following: IMPRESSION: Moderate hemorrhage within the bladder. Consider cystoscopy as clinically warranted.  Multiple bilateral nonobstructing renal calculi measuring up to 2 mm. No ureteral calculi or hydronephrosis.  Status post prostatectomy.   Patient was treated with 3 L of continuous bladder irrigation but continued to have pinkish-red drainage.  Urology was consulted.  With this bladder irrigation patient's hematuria continued to improve and almost resolved.  Urologist evaluated the patient patient continued to be hemodynamically stable and hemoglobin hemoglobin also was stable.  Urologist cleared the patient for discharge to advise that the patient should be discharged with a Foley catheter and also with antibiotics cefuroxime for 3 days.  Patient will follow-up with the PCP in 1 week and urologist in 3 days.   Assessment  and Plan: * Gross hematuria BPH s/p HoLEP prostatectomy 02/2022 Acute urinary retention Continue CBI Urology consult Pain control Hold aspirin        Consultants: Urology Procedures performed: Continuous bladder irrigation Disposition: Home Diet recommendation:  Discharge Diet Orders (From admission, onward)     Start     Ordered   05/07/22 0000  Diet - low sodium heart healthy        05/07/22 1746           Cardiac diet DISCHARGE MEDICATION: Allergies as of 05/07/2022       Reactions   Meperidine Other (See Comments)   GI Upset   Sulfa Antibiotics Hives   Gabapentin Other (See Comments)   Migraine HA   Lisinopril Cough        Medication List     STOP taking these medications    aspirin EC 81 MG tablet   calcium carbonate 500 MG chewable tablet Commonly known as: TUMS - dosed in mg elemental calcium   docusate sodium 100 MG capsule Commonly known as: Colace   ibuprofen 200 MG tablet Commonly known as: ADVIL   loratadine 10 MG tablet Commonly known as: CLARITIN   oxyCODONE 5 MG immediate release tablet Commonly known as: Roxicodone   VITAMIN B 12 PO       TAKE these medications    allopurinol 300 MG tablet Commonly known as: ZYLOPRIM Take 300 mg by mouth daily.   calcium gluconate 500 MG tablet Take 1 tablet by mouth daily.   cefUROXime 500 MG tablet Commonly known as: CEFTIN Take 1 tablet (500 mg total) by mouth 2 (two)  times daily with a meal for 3 days.   Coenzyme Q-10 200 MG Caps Take 200 mg by mouth daily.   GLUCOSAMINE 1500 COMPLEX PO Take 1,500 mg by mouth daily.   losartan 100 MG tablet Commonly known as: COZAAR Take 100 mg by mouth daily.   Magnesium 250 MG Tabs Take 250 mg by mouth daily.   mometasone 50 MCG/ACT nasal spray Commonly known as: NASONEX Place 1 spray into the nose 2 (two) times daily.   multivitamin capsule Take 1 capsule by mouth daily.   omeprazole 20 MG capsule Commonly known as:  PRILOSEC Take 20 mg by mouth daily.   oxybutynin 5 MG tablet Commonly known as: DITROPAN Take 1 tablet (5 mg total) by mouth every 8 (eight) hours as needed for up to 5 days for bladder spasms.   Restasis 0.05 % ophthalmic emulsion Generic drug: cycloSPORINE Place 2 drops into both eyes 2 (two) times daily.   rosuvastatin 5 MG tablet Commonly known as: CRESTOR Take 5 mg by mouth daily.   senna 8.6 MG Tabs tablet Commonly known as: SENOKOT Take 2 tablets (17.2 mg total) by mouth 2 (two) times daily.   Vitamin D2 50 MCG (2000 UT) Tabs Take 2,000 Units by mouth daily.        Follow-up Information     [place] Pink, MD Follow up in 1 week(s).   Specialty: Family Medicine Contact information: [address] [place] [address] [phone]         Abbie Sons, MD Follow up in 3 day(s).   Specialty: Urology Contact information: [place] [address] [phone]                Discharge Exam: Danley Danker Weights   05/05/22 2149 05/06/22 0913  Weight: 79.4 kg 81.3 kg   Patient was seen and examined bedside today. CVS S1-S2 positive Respiratory: Bilateral clear and equal breath sounds.  Condition at discharge: fair  The results of significant diagnostics from this hospitalization (including imaging, microbiology, ancillary and laboratory) are listed below for reference.   Imaging Studies: US PELVIS LIMITED (TRANSABDOMINAL ONLY)  Result Date: 05/07/2022 CLINICAL DATA:  Gross hematuria EXAM: LIMITED ULTRASOUND OF PELVIS TECHNIQUE: Limited transabdominal ultrasound examination of the pelvis was performed. COMPARISON:  CT 05/05/2022 FINDINGS: Bladder is decompressed by Foley catheter. Possible small amount of echogenic material around the Foley balloon. Pre-void volume: Unable to measure Post-void volume: Unable to measure Other findings:  None. IMPRESSION: Empty urinary bladder with Foley catheter in  place. Possible small amount of echogenic/hemorrhagic material around the Foley balloon. Electronically Signed   By: Donavan Foil M.D.   On: 05/07/2022 16:58   CT ABDOMEN PELVIS W WO CONTRAST  Result Date: 05/06/2022 CLINICAL DATA:  Hematuria, status post prostatectomy EXAM: CT ABDOMEN AND PELVIS WITHOUT AND WITH CONTRAST TECHNIQUE: Multidetector CT imaging of the abdomen and pelvis was performed following the standard protocol before and following the bolus administration of intravenous contrast. RADIATION DOSE REDUCTION: This exam was performed according to the departmental dose-optimization program which includes automated exposure control, adjustment of the mA and/or kV according to patient size and/or use of iterative reconstruction technique. CONTRAST:  131m OMNIPAQUE IOHEXOL 300 MG/ML  SOLN COMPARISON:  None Available. FINDINGS: Lower chest: Lung bases are clear. Hepatobiliary: Unenhanced liver is unremarkable. Gallbladder is unremarkable. No intrahepatic or extrahepatic duct dilatation. Pancreas: Within normal limits. Spleen: Within normal limits. Adrenals/Urinary Tract: Adrenal glands are within normal limits.  Multiple bilateral nonobstructing renal calculi measuring up to 2 mm, proximally 4 on the right and 6 on the left. No ureteral calculi or hydronephrosis. Moderate hemorrhage within the bladder (series 2/image 78). Stomach/Bowel: Stomach is within normal limits. No evidence of bowel obstruction. Appendix is not discretely visualized. No colonic wall thickening or inflammatory changes. Vascular/Lymphatic: No evidence of abdominal aortic aneurysm. Atherosclerotic calcifications of the abdominal aorta and branch vessels. No suspicious abdominopelvic lymphadenopathy. Reproductive: Status post prostatectomy. Other: No abdominopelvic ascites. Musculoskeletal: Degenerative changes of the visualized thoracolumbar spine. Right hip arthroplasty, without evidence of complication. IMPRESSION: Moderate  hemorrhage within the bladder. Consider cystoscopy as clinically warranted. Multiple bilateral nonobstructing renal calculi measuring up to 2 mm. No ureteral calculi or hydronephrosis. Status post prostatectomy. Electronically Signed   By: Julian Hy M.D.   On: 05/06/2022 00:00    Microbiology: Results for orders placed or performed in visit on 02/28/22  CULTURE, URINE COMPREHENSIVE     Status: None   Collection Time: 02/28/22  1:26 PM   Specimen: Urine   UR  Result Value Ref Range Status   Urine Culture, Comprehensive Final report  Final   Organism ID, Bacteria Comment  Final    Comment: No growth in 36 - 48 hours.  Microscopic Examination     Status: Abnormal   Collection Time: 02/28/22  1:26 PM   Urine  Result Value Ref Range Status   WBC, UA 0-5 0 - 5 /hpf Final   RBC, Urine 0-2 0 - 2 /hpf Final   Epithelial Cells (non renal) 0-10 0 - 10 /hpf Final   Casts Present (A) None seen /lpf Final   Cast Type Granular casts (A) N/A Final    Comment: Hyaline casts   Mucus, UA Present (A) Not Estab. Final   Bacteria, UA Few None seen/Few Final    Labs: CBC: Recent Labs  Lab 05/05/22 2207 05/06/22 1112 05/06/22 1729 05/07/22 0520 05/07/22 1700  WBC 9.8 8.0 10.2 10.9* 8.9  NEUTROABS 5.5  --   --   --   --   HGB 15.5 13.8 13.6 14.1 14.2  HCT 45.6 40.9 39.9 41.6 42.1  MCV 94.2 94.7 94.8 95.6 93.8  PLT 183 144* 152 151 384   Basic Metabolic Panel: Recent Labs  Lab 05/05/22 2207  NA 139  K 4.0  CL 110  CO2 19*  GLUCOSE 130*  BUN 21  CREATININE 1.08  CALCIUM 9.7   Liver Function Tests: Recent Labs  Lab 05/05/22 2207  AST 29  ALT 27  ALKPHOS 90  BILITOT 0.8  PROT 7.5  ALBUMIN 4.5   CBG: No results for input(s): "GLUCAP" in the last 168 hours.  Discharge time spent: greater than 30 minutes.  Signed: Carlyle Lipa, MD [facility] 05/07/2022

## 2022-05-07 NOTE — Progress Notes (Signed)
Progress update: Nursing staff stated the urine was still red this afternoon, so pelvic ultrasound was ordered.  Pelvic ultrasound noted minimal clot material.  I went up and irrigated Scott Spence again with 500 mL of sterile water and retrieved scant clot material.  The urine was pink clear.  His wife was present as well.  I instructed her on how to irrigate the catheter in case he went into clot retention during the night.  We discussed either going home without the catheter or being discharged with the catheter in place.  I stated I felt more comfortable discharging with the catheter in case he should start experiencing worsening hematuria or production of more clots, his wife would be able to irrigate the catheter for him and hopefully avoid any emergency room visits tonight.    We will discharge him on 3 days of Ceftin 500 mg twice daily as preventative measures as we have irrigated him several times during his admission.  He will come Wednesday morning to get the Foley catheter out.    Okay to discharge from urological standpoint.

## 2022-05-07 NOTE — Progress Notes (Signed)
I checked back in with the patient this afternoon and the urine was a very light pink.  I have shut the CBI off.  If he continues to have light pink urine with no clots, he will be stable for discharge from urological perspective.

## 2022-05-08 ENCOUNTER — Telehealth: Payer: Self-pay | Admitting: Urology

## 2022-05-08 NOTE — Progress Notes (Unsigned)
05/09/2022 8:43 AM   Scott Spence April 25, 1951 [id number]  Referring provider: Maryland Pink, MD [address] [place],  [place] [address]  Urological history: 1. Prostate cancer -PSA (12/2021) 8.6 -prostate biopsy (02/2017) - negative for PSA of 6  -pathology from HoLEP (02/2022) - Gleason 3 + 3 adenocarcinoma   2. BPH w/ LU TS -s/p HoLEP (02/2022)    Chief Complaint  Patient presents with   Follow-up    HPI: Scott Spence is a 71 y.o. male who presents today for Foley removal.   He was recently admitted after experiencing clot urinary retention and was treated with bladder irrigation and CBI overnight.  Since the leaving the hospital, the urine has continued to get less pink and more yellow.  He did have to irrigate the catheter last night, but he has not had any further gross heme.   Patient denies any modifying or aggravating factors.  Patient denies any gross hematuria, dysuria or suprapubic/flank pain.  Patient denies any fevers, chills, nausea or vomiting.     PMH: Past Medical History:  Diagnosis Date   BPH with urinary obstruction    Chronic kidney disease    hemorrhagic nephritis as a child   Complication of anesthesia    collapsed lung with local for LUE surgery   GERD (gastroesophageal reflux disease)    Gout    History of kidney stones    Hypertension     Surgical History: Past Surgical History:  Procedure Laterality Date   APPENDECTOMY     FOOT ARTHRODESIS Right 03/29/2022   Procedure: SUBTALAR ARTHRODESIS;  Surgeon: Wylene Simmer, MD;  Location: [facility];  Service: Orthopedics;  Laterality: Right;   HERNIA REPAIR     HOLEP-LASER ENUCLEATION OF THE PROSTATE WITH MORCELLATION N/A 03/09/2022   Procedure: HOLEP-LASER ENUCLEATION OF THE PROSTATE WITH MORCELLATION;  Surgeon: Billey Co, MD;  Location: [facility];  Service: Urology;  Laterality: N/A;   KIDNEY STONE SURGERY     PROSTATECTOMY     TOTAL HIP  ARTHROPLASTY Right 04/01/2018   Procedure: TOTAL HIP ARTHROPLASTY ANTERIOR APPROACH;  Surgeon: Hessie Knows, MD;  Location: [facility];  Service: Orthopedics;  Laterality: Right;    Home Medications:  Allergies as of 05/09/2022       Reactions   Meperidine Other (See Comments)   GI Upset   Sulfa Antibiotics Hives   Gabapentin Other (See Comments)   Migraine HA   Lisinopril Cough        Medication List        Accurate as of May 09, 2022  8:43 AM. If you have any questions, ask your nurse or doctor.          allopurinol 300 MG tablet Commonly known as: ZYLOPRIM Take 300 mg by mouth daily.   calcium gluconate 500 MG tablet Take 1 tablet by mouth daily.   cefUROXime 500 MG tablet Commonly known as: CEFTIN Take 1 tablet (500 mg total) by mouth 2 (two) times daily with a meal for 3 days.   Coenzyme Q-10 200 MG Caps Take 200 mg by mouth daily.   GLUCOSAMINE 1500 COMPLEX PO Take 1,500 mg by mouth daily.   losartan 100 MG tablet Commonly known as: COZAAR Take 100 mg by mouth daily.   Magnesium 250 MG Tabs Take 250 mg by mouth daily.   mometasone 50 MCG/ACT nasal spray Commonly known as: NASONEX Place 1 spray into the nose 2 (two) times daily.  multivitamin capsule Take 1 capsule by mouth daily.   omeprazole 20 MG capsule Commonly known as: PRILOSEC Take 20 mg by mouth daily.   oxybutynin 5 MG tablet Commonly known as: DITROPAN Take 1 tablet (5 mg total) by mouth every 8 (eight) hours as needed for up to 5 days for bladder spasms.   Restasis 0.05 % ophthalmic emulsion Generic drug: cycloSPORINE Place 2 drops into both eyes 2 (two) times daily.   rosuvastatin 5 MG tablet Commonly known as: CRESTOR Take 5 mg by mouth daily.   senna 8.6 MG Tabs tablet Commonly known as: SENOKOT Take 2 tablets (17.2 mg total) by mouth 2 (two) times daily.   Vitamin D2 50 MCG (2000 UT) Tabs Take 2,000 Units by mouth daily.        Allergies:  Allergies   Allergen Reactions   Meperidine Other (See Comments)    GI Upset   Sulfa Antibiotics Hives   Gabapentin Other (See Comments)    Migraine HA   Lisinopril Cough    Family History: Family History  Problem Relation Age of Onset   Prostate cancer Neg Hx    Bladder Cancer Neg Hx    Kidney cancer Neg Hx     Social History:  reports that he has never smoked. He has never been exposed to tobacco smoke. He has never used smokeless tobacco. He reports current alcohol use of about 14.0 standard drinks of alcohol per week. He reports that he does not use drugs.  ROS: Pertinent ROS in HPI  Physical Exam: Constitutional:  Well nourished. Alert and oriented, No acute distress. HEENT: [place] AT, moist mucus membranes.  Trachea midline Cardiovascular: No clubbing, cyanosis, or edema. Respiratory: Normal respiratory effort, no increased work of breathing. GU: No CVA tenderness.  No bladder fullness or masses.  Patient with circumcised phallus.  Foley in place.  Draining yellow urine.  No penile discharge. No penile lesions or rashes. Scrotum without lesions, cysts, rashes and/or edema. Neurologic: Grossly intact, no focal deficits, moving all 4 extremities. Psychiatric: Normal mood and affect.  Laboratory Data: Lab Results  Component Value Date   WBC 8.9 05/07/2022   HGB 14.2 05/07/2022   HCT 42.1 05/07/2022   MCV 93.8 05/07/2022   PLT 155 05/07/2022    Lab Results  Component Value Date   CREATININE 1.08 05/05/2022    Lab Results  Component Value Date   AST 29 05/05/2022   Lab Results  Component Value Date   ALT 27 05/05/2022    Urinalysis    Component Value Date/Time   COLORURINE YELLOW (A) 03/17/2018 1329   APPEARANCEUR Clear 02/28/2022 1326   LABSPEC 1.018 03/17/2018 1329   PHURINE 5.0 03/17/2018 1329   GLUCOSEU Negative 02/28/2022 1326   HGBUR NEGATIVE 03/17/2018 1329   BILIRUBINUR Negative 02/28/2022 1326   KETONESUR NEGATIVE 03/17/2018 1329   PROTEINUR 1+ (A)  02/28/2022 1326   PROTEINUR NEGATIVE 03/17/2018 1329   NITRITE Negative 02/28/2022 1326   NITRITE NEGATIVE 03/17/2018 1329   LEUKOCYTESUR Negative 02/28/2022 1326  I have reviewed the labs.   Pertinent Imaging: N/A   Catheter Removal Patient is present today for a catheter removal.  40 ml of water was drained from the balloon. A 20 FR three way foley cath was removed from the bladder, no complications were noted. Patient tolerated well.  Assessment & Plan:    1. Gross hematuria/clot retention -likely secondary to BPH/s/p HoLEP  -Foley removed -resolved for now -encouraged to keep hydrated in a  reasonable fashion  Return for keep follow up w/Dr. Diamantina Providence .  These notes generated with voice recognition software. I apologize for typographical errors.  [place], Kamiah [address]  [place], [place] [address] [phone]

## 2022-05-08 NOTE — Telephone Encounter (Signed)
Contacted Scott Spence this morning to check on his status.  He states that he is producing clear yellow urine.  We will see him tomorrow for Foley catheter removal.

## 2022-05-09 ENCOUNTER — Ambulatory Visit (INDEPENDENT_AMBULATORY_CARE_PROVIDER_SITE_OTHER): Payer: Medicare Other | Admitting: Urology

## 2022-05-09 DIAGNOSIS — R338 Other retention of urine: Secondary | ICD-10-CM

## 2022-05-09 DIAGNOSIS — R310 Gross hematuria: Secondary | ICD-10-CM

## 2022-05-30 ENCOUNTER — Other Ambulatory Visit: Payer: Medicare Other

## 2022-05-30 DIAGNOSIS — N138 Other obstructive and reflux uropathy: Secondary | ICD-10-CM

## 2022-05-31 LAB — PSA TOTAL (REFLEX TO FREE): Prostate Specific Ag, Serum: 0.4 ng/mL (ref 0.0–4.0)

## 2022-06-07 ENCOUNTER — Encounter: Payer: Self-pay | Admitting: Urology

## 2022-06-07 ENCOUNTER — Ambulatory Visit (INDEPENDENT_AMBULATORY_CARE_PROVIDER_SITE_OTHER): Payer: Medicare Other | Admitting: Urology

## 2022-06-07 VITALS — BP 176/101 | HR 74 | Ht 67.5 in | Wt 175.0 lb

## 2022-06-07 DIAGNOSIS — N401 Enlarged prostate with lower urinary tract symptoms: Secondary | ICD-10-CM

## 2022-06-07 DIAGNOSIS — N138 Other obstructive and reflux uropathy: Secondary | ICD-10-CM

## 2022-06-07 DIAGNOSIS — C61 Malignant neoplasm of prostate: Secondary | ICD-10-CM

## 2022-06-07 LAB — BLADDER SCAN AMB NON-IMAGING

## 2022-06-07 NOTE — Progress Notes (Signed)
   06/07/2022 1:08 PM   Scott Spence 08-29-1950 [id number]  Reason for visit: Follow up BPH status post HOLEP, gross hematuria, prostate cancer  HPI: 71 year old male with worsening urinary symptoms despite Flomax who opted for HOLEP.  He has a history of an elevated PSA ranging from 5-9, and a negative prostate biopsy in 2018, and 4K score showing only 11% chance of clinically significant prostate cancer.  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  Postop PSA dropped appropriately to 0.4 from 8.6.  We discussed the need to continue to monitor the PSA long-term(active surveillance), and that additional treatments are unlikely to be needed in the future.  Notably, he was admitted 6 weeks after surgery with hematuria and clot retention that required CBI.  CT at that time did show some clot in the bladder, and Dr. Bernardo Heater saw him at that time in consultation and bladder was irrigated multiple times via Foley.  He passed a voiding trial a few days later, and has been doing well since that time with no further gross hematuria.  He is urinating with a strong stream and is not having any incontinence or wearing a pad.  Overall very happy with urinary symptoms at this time, PVR today normal at 43m.  RTC 1 year PSA prior, PVR     [name]  [name]

## 2022-08-07 ENCOUNTER — Inpatient Hospital Stay
Admission: EM | Admit: 2022-08-07 | Discharge: 2022-08-09 | DRG: 149 | Disposition: A | Discharge status: Home / Self Care | Payer: Medicare HMO | Attending: Student | Admitting: Student

## 2022-08-07 ENCOUNTER — Other Ambulatory Visit: Payer: Self-pay

## 2022-08-07 ENCOUNTER — Emergency Department: Payer: Medicare HMO

## 2022-08-07 ENCOUNTER — Encounter: Payer: Self-pay | Admitting: Emergency Medicine

## 2022-08-07 DIAGNOSIS — K219 Gastro-esophageal reflux disease without esophagitis: Secondary | ICD-10-CM | POA: Diagnosis present

## 2022-08-07 DIAGNOSIS — M25511 Pain in right shoulder: Secondary | ICD-10-CM | POA: Diagnosis present

## 2022-08-07 DIAGNOSIS — G8929 Other chronic pain: Secondary | ICD-10-CM | POA: Diagnosis present

## 2022-08-07 DIAGNOSIS — Z91128 Patient's intentional underdosing of medication regimen for other reason: Secondary | ICD-10-CM | POA: Diagnosis not present

## 2022-08-07 DIAGNOSIS — Z87442 Personal history of urinary calculi: Secondary | ICD-10-CM | POA: Diagnosis not present

## 2022-08-07 DIAGNOSIS — R42 Dizziness and giddiness: Principal | ICD-10-CM

## 2022-08-07 DIAGNOSIS — H8113 Benign paroxysmal vertigo, bilateral: Secondary | ICD-10-CM | POA: Diagnosis not present

## 2022-08-07 DIAGNOSIS — Z7951 Long term (current) use of inhaled steroids: Secondary | ICD-10-CM | POA: Diagnosis not present

## 2022-08-07 DIAGNOSIS — Z9079 Acquired absence of other genital organ(s): Secondary | ICD-10-CM

## 2022-08-07 DIAGNOSIS — N183 Chronic kidney disease, stage 3 unspecified: Secondary | ICD-10-CM | POA: Diagnosis present

## 2022-08-07 DIAGNOSIS — Z1152 Encounter for screening for COVID-19: Secondary | ICD-10-CM

## 2022-08-07 DIAGNOSIS — E785 Hyperlipidemia, unspecified: Secondary | ICD-10-CM | POA: Diagnosis present

## 2022-08-07 DIAGNOSIS — N401 Enlarged prostate with lower urinary tract symptoms: Secondary | ICD-10-CM | POA: Diagnosis present

## 2022-08-07 DIAGNOSIS — T465X6A Underdosing of other antihypertensive drugs, initial encounter: Secondary | ICD-10-CM | POA: Diagnosis present

## 2022-08-07 DIAGNOSIS — Z96641 Presence of right artificial hip joint: Secondary | ICD-10-CM | POA: Diagnosis present

## 2022-08-07 DIAGNOSIS — Z7982 Long term (current) use of aspirin: Secondary | ICD-10-CM

## 2022-08-07 DIAGNOSIS — Z888 Allergy status to other drugs, medicaments and biological substances status: Secondary | ICD-10-CM

## 2022-08-07 DIAGNOSIS — G459 Transient cerebral ischemic attack, unspecified: Secondary | ICD-10-CM | POA: Diagnosis not present

## 2022-08-07 DIAGNOSIS — I129 Hypertensive chronic kidney disease with stage 1 through stage 4 chronic kidney disease, or unspecified chronic kidney disease: Secondary | ICD-10-CM | POA: Diagnosis present

## 2022-08-07 DIAGNOSIS — Z79899 Other long term (current) drug therapy: Secondary | ICD-10-CM | POA: Diagnosis not present

## 2022-08-07 DIAGNOSIS — H81399 Other peripheral vertigo, unspecified ear: Secondary | ICD-10-CM | POA: Diagnosis present

## 2022-08-07 DIAGNOSIS — I6521 Occlusion and stenosis of right carotid artery: Secondary | ICD-10-CM | POA: Diagnosis present

## 2022-08-07 DIAGNOSIS — R2681 Unsteadiness on feet: Secondary | ICD-10-CM | POA: Diagnosis present

## 2022-08-07 DIAGNOSIS — Z91013 Allergy to seafood: Secondary | ICD-10-CM | POA: Diagnosis not present

## 2022-08-07 DIAGNOSIS — M109 Gout, unspecified: Secondary | ICD-10-CM | POA: Diagnosis present

## 2022-08-07 DIAGNOSIS — H81391 Other peripheral vertigo, right ear: Secondary | ICD-10-CM | POA: Diagnosis not present

## 2022-08-07 DIAGNOSIS — I10 Essential (primary) hypertension: Secondary | ICD-10-CM

## 2022-08-07 DIAGNOSIS — H81393 Other peripheral vertigo, bilateral: Principal | ICD-10-CM | POA: Diagnosis present

## 2022-08-07 DIAGNOSIS — I639 Cerebral infarction, unspecified: Secondary | ICD-10-CM | POA: Diagnosis not present

## 2022-08-07 DIAGNOSIS — Z981 Arthrodesis status: Secondary | ICD-10-CM

## 2022-08-07 LAB — CBC
HCT: 50.1 % (ref 39.0–52.0)
Hemoglobin: 17.1 g/dL — ABNORMAL HIGH (ref 13.0–17.0)
MCH: 31.9 pg (ref 26.0–34.0)
MCHC: 34.1 g/dL (ref 30.0–36.0)
MCV: 93.5 fL (ref 80.0–100.0)
Platelets: 160 10*3/uL (ref 150–400)
RBC: 5.36 MIL/uL (ref 4.22–5.81)
RDW: 13.2 % (ref 11.5–15.5)
WBC: 6.9 10*3/uL (ref 4.0–10.5)
nRBC: 0 % (ref 0.0–0.2)

## 2022-08-07 LAB — URINALYSIS, ROUTINE W REFLEX MICROSCOPIC
Bilirubin Urine: NEGATIVE
Glucose, UA: NEGATIVE mg/dL
Hgb urine dipstick: NEGATIVE
Ketones, ur: NEGATIVE mg/dL
Leukocytes,Ua: NEGATIVE
Nitrite: NEGATIVE
Protein, ur: NEGATIVE mg/dL
Specific Gravity, Urine: 1.032 — ABNORMAL HIGH (ref 1.005–1.030)
pH: 6 (ref 5.0–8.0)

## 2022-08-07 LAB — COMPREHENSIVE METABOLIC PANEL
ALT: 37 U/L (ref 0–44)
AST: 36 U/L (ref 15–41)
Albumin: 4.7 g/dL (ref 3.5–5.0)
Alkaline Phosphatase: 100 U/L (ref 38–126)
Anion gap: 9 (ref 5–15)
BUN: 18 mg/dL (ref 8–23)
CO2: 25 mmol/L (ref 22–32)
Calcium: 9.7 mg/dL (ref 8.9–10.3)
Chloride: 101 mmol/L (ref 98–111)
Creatinine, Ser: 0.99 mg/dL (ref 0.61–1.24)
GFR, Estimated: >60 mL/min (ref >60)
Glucose, Bld: 126 mg/dL — ABNORMAL HIGH (ref 70–99)
Potassium: 3.9 mmol/L (ref 3.5–5.1)
Sodium: 135 mmol/L (ref 135–145)
Total Bilirubin: 1.3 mg/dL — ABNORMAL HIGH (ref 0.3–1.2)
Total Protein: 7.8 g/dL (ref 6.5–8.1)

## 2022-08-07 LAB — APTT: aPTT: 28 seconds (ref 24–36)

## 2022-08-07 LAB — TROPONIN I (HIGH SENSITIVITY)
Troponin I (High Sensitivity): 7 ng/L (ref <18)
Troponin I (High Sensitivity): 10 ng/L (ref <18)

## 2022-08-07 LAB — DIFFERENTIAL
Abs Immature Granulocytes: 0.03 10*3/uL (ref 0.00–0.07)
Basophils Absolute: 0.1 10*3/uL (ref 0.0–0.1)
Basophils Relative: 1 %
Eosinophils Absolute: 0.1 10*3/uL (ref 0.0–0.5)
Eosinophils Relative: 2 %
Immature Granulocytes: 0 %
Lymphocytes Relative: 28 %
Lymphs Abs: 1.9 10*3/uL (ref 0.7–4.0)
Monocytes Absolute: 0.4 10*3/uL (ref 0.1–1.0)
Monocytes Relative: 5 %
Neutro Abs: 4.5 10*3/uL (ref 1.7–7.7)
Neutrophils Relative %: 64 %

## 2022-08-07 LAB — RESP PANEL BY RT-PCR (RSV, FLU A&B, COVID)  RVPGX2
Influenza A by PCR: NEGATIVE
Influenza B by PCR: NEGATIVE
Resp Syncytial Virus by PCR: NEGATIVE
SARS Coronavirus 2 by RT PCR: NEGATIVE

## 2022-08-07 LAB — PROTIME-INR
INR: 1.1 (ref 0.8–1.2)
Prothrombin Time: 13.6 seconds (ref 11.4–15.2)

## 2022-08-07 LAB — ETHANOL: Alcohol, Ethyl (B): <10 mg/dL (ref <10)

## 2022-08-07 MED ORDER — ACETAMINOPHEN 160 MG/5ML PO SOLN: 650.0000 mg | ORAL | Status: DC | PRN

## 2022-08-07 MED ORDER — ALLOPURINOL 100 MG PO TABS
300.0000 mg | ORAL_TABLET | Freq: Every day | ORAL | Status: DC
  Administered 2022-08-07 – 2022-08-09 (×3): 300 mg via ORAL
  Filled 2022-08-07 (×3): qty 3

## 2022-08-07 MED ORDER — ACETAMINOPHEN 650 MG RE SUPP: 650.0000 mg | RECTAL | Status: DC | PRN

## 2022-08-07 MED ORDER — MECLIZINE HCL 25 MG PO TABS
25.0000 mg | ORAL_TABLET | Freq: Once | ORAL | Status: AC
  Administered 2022-08-07: 25 mg via ORAL
  Filled 2022-08-07: qty 1

## 2022-08-07 MED ORDER — ASPIRIN 81 MG PO TBEC
81.0000 mg | DELAYED_RELEASE_TABLET | Freq: Every day | ORAL | Status: DC
  Administered 2022-08-08 – 2022-08-09 (×2): 81 mg via ORAL
  Filled 2022-08-07 (×2): qty 1

## 2022-08-07 MED ORDER — STROKE: EARLY STAGES OF RECOVERY BOOK: Freq: Once | Status: AC

## 2022-08-07 MED ORDER — CLOPIDOGREL BISULFATE 75 MG PO TABS
300.0000 mg | ORAL_TABLET | Freq: Once | ORAL | Status: AC
  Administered 2022-08-07: 300 mg via ORAL
  Filled 2022-08-07: qty 4

## 2022-08-07 MED ORDER — CLOPIDOGREL BISULFATE 75 MG PO TABS
75.0000 mg | ORAL_TABLET | Freq: Every day | ORAL | Status: DC
  Administered 2022-08-08 – 2022-08-09 (×2): 75 mg via ORAL
  Filled 2022-08-07 (×2): qty 1

## 2022-08-07 MED ORDER — SODIUM CHLORIDE 0.9 % IV SOLN: INTRAVENOUS | Status: AC

## 2022-08-07 MED ORDER — MECLIZINE HCL 12.5 MG PO TABS
12.5000 mg | ORAL_TABLET | Freq: Three times a day (TID) | ORAL | Status: DC
  Administered 2022-08-07: 12.5 mg via ORAL
  Filled 2022-08-07: qty 0.5
  Filled 2022-08-07: qty 1

## 2022-08-07 MED ORDER — ACETAMINOPHEN 325 MG PO TABS: 650.0000 mg | ORAL_TABLET | ORAL | Status: DC | PRN

## 2022-08-07 MED ORDER — IOHEXOL 350 MG/ML SOLN
75.0000 mL | Freq: Once | INTRAVENOUS | Status: AC | PRN
  Administered 2022-08-07: 75 mL via INTRAVENOUS

## 2022-08-07 MED ORDER — ROSUVASTATIN CALCIUM 10 MG PO TABS
5.0000 mg | ORAL_TABLET | Freq: Every day | ORAL | Status: DC
  Administered 2022-08-07 – 2022-08-09 (×3): 5 mg via ORAL
  Filled 2022-08-07 (×3): qty 1

## 2022-08-07 MED ORDER — PANTOPRAZOLE SODIUM 40 MG PO TBEC
40.0000 mg | DELAYED_RELEASE_TABLET | Freq: Every day | ORAL | Status: DC
  Administered 2022-08-07 – 2022-08-09 (×3): 40 mg via ORAL
  Filled 2022-08-07 (×3): qty 1

## 2022-08-07 MED ORDER — HEPARIN SODIUM (PORCINE) 5000 UNIT/ML IJ SOLN
5000.0000 [IU] | Freq: Three times a day (TID) | INTRAMUSCULAR | Status: DC
  Administered 2022-08-08 – 2022-08-09 (×3): 5000 [IU] via SUBCUTANEOUS
  Filled 2022-08-07 (×3): qty 1

## 2022-08-07 NOTE — ED Provider Notes (Signed)
[facility] Provider Note    Event Date/Time   First MD Initiated Contact with Patient 08/07/22 1502     (approximate)   History   Dizziness   HPI  Scott Spence is a 72 y.o. male no significant past medical history other than hypertension on losartan presents to the ER for evaluation of dizziness started yesterday.  States he is having trouble with steady gait right now.  Denies any falls.  No back pain no neck pain.  No numbness or tingling.  Has never had vertigo before.  Feels that the symptoms are worse when he turns his head and it is constant.  Denies any blurry vision.  No headaches.  Was concerned because his blood pressure was elevated.  Denies any chest pain or shortness of breath.  No fevers.  No new medications or medication changes.     Physical Exam   Triage Vital Signs: ED Triage Vitals  Enc Vitals Group     BP 08/07/22 1403 (!) 195/120     Pulse Rate 08/07/22 1403 73     Resp 08/07/22 1403 18     Temp 08/07/22 1403 97.8 F (36.6 C)     Temp Source 08/07/22 1403 Oral     SpO2 08/07/22 1403 98 %     Weight 08/07/22 1402 175 lb 0.7 oz (79.4 kg)     Height 08/07/22 1402 '5\' 7"'$  (1.702 m)     Head Circumference --      Peak Flow --      Pain Score 08/07/22 1402 0     Pain Loc --      Pain Edu? --      Excl. in [place]? --     Most recent vital signs: Vitals:   08/07/22 1403 08/07/22 1557  BP: (!) 195/120 (!) 136/95  Pulse: 73 78  Resp: 18 18  Temp: 97.8 F (36.6 C)   SpO2: 98% 98%     Constitutional: Alert  Eyes: Conjunctivae are normal.  Head: Atraumatic. Nose: No congestion/rhinnorhea. Mouth/Throat: Mucous membranes are moist.   Neck: Painless ROM.  Cardiovascular:   Good peripheral circulation. Respiratory: Normal respiratory effort.  No retractions.  Gastrointestinal: Soft and nontender.  Musculoskeletal:  no deformity Neurologic:  CN- intact.  No facial droop, Normal FNF.  Normal heel to shin.  Sensation intact  bilaterally. Normal speech and language. No gross focal neurologic deficits are appreciated. No gait instability. Skin:  Skin is warm, dry and intact. No rash noted. Psychiatric: Mood and affect are normal. Speech and behavior are normal.    ED Results / Procedures / Treatments   Labs (all labs ordered are listed, but only abnormal results are displayed) Labs Reviewed  CBC - Abnormal; Notable for the following components:      Result Value   Hemoglobin 17.1 (*)    All other components within normal limits  COMPREHENSIVE METABOLIC PANEL - Abnormal; Notable for the following components:   Glucose, Bld 126 (*)    Total Bilirubin 1.3 (*)    All other components within normal limits  PROTIME-INR  APTT  DIFFERENTIAL  ETHANOL  CBG MONITORING, ED  TROPONIN I (HIGH SENSITIVITY)     EKG  ED ECG REPORT I, Merlyn Lot, the attending physician, personally viewed and interpreted this ECG.   Date: 08/07/2022  EKG Time: 14:05  Rate: 75  Rhythm: sinus  Axis: normal  Intervals:normal  ST&T Change: no stemi, no depressions    RADIOLOGY Please see  ED Course for my review and interpretation.  I personally reviewed all radiographic images ordered to evaluate for the above acute complaints and reviewed radiology reports and findings.  These findings were personally discussed with the patient.  Please see medical record for radiology report.    PROCEDURES:  Critical Care performed:   Procedures   MEDICATIONS ORDERED IN ED: Medications  meclizine (ANTIVERT) tablet 25 mg (25 mg Oral Given 08/07/22 1555)     IMPRESSION / MDM / ASSESSMENT AND PLAN / ED COURSE  I reviewed the triage vital signs and the nursing notes.                              Differential diagnosis includes, but is not limited to, cva, tia, hypoglycemia, dehydration, electrolyte abnormality, dissection, sepsis  Patient presenting to the ER for evaluation of symptoms as described above.  Based on  symptoms, risk factors and considered above differential, this presenting complaint could reflect a potentially life-threatening illness therefore the patient will be placed on continuous pulse oximetry and telemetry for monitoring.  Laboratory evaluation will be sent to evaluate for the above complaints.      Clinical Course as of 08/07/22 1800  Tue Aug 07, 2022  1515 Ct head on my review and interpretation doesn't show evidence of sdh or iph.  [PR]  1751 MRI without acute findings but does have chronic microvascular ischemic changes.  Patient still dizzy very unsteady with ambulation using a walker to keep from falling.  Did not feel any improvement with meclizine.  Given his age and significant family history of CVA with persistent symptoms will discuss case in consultation with hospitalist for admission. [PR]    Clinical Course User Index [PR] Merlyn Lot, MD    FINAL CLINICAL IMPRESSION(S) / ED DIAGNOSES   Final diagnoses:  Vertigo  Unsteadiness on feet  Hypertension, unspecified type     Rx / DC Orders   ED Discharge Orders     None        Note:  This document was prepared using Dragon voice recognition software and may include unintentional dictation errors.    Merlyn Lot, MD 08/07/22 1800

## 2022-08-07 NOTE — ED Triage Notes (Signed)
Pt presents to the ED via POV due to new onset of dizziness, loss of balance, and elevated BP that started last night. Pt is currently using a walker and does not normally use a walker. Pt is still dizziness. Pt denies blood thinners and NVD. Pt A&Ox4

## 2022-08-07 NOTE — Plan of Care (Signed)
  Problem: Ischemic Stroke/TIA Tissue Perfusion: Goal: Complications of ischemic stroke/TIA will be minimized Outcome: Progressing   Problem: Coping: Goal: Will verbalize positive feelings about self Outcome: Progressing   Problem: Health Behavior/Discharge Planning: Goal: Ability to manage health-related needs will improve Outcome: Progressing   Problem: Self-Care: Goal: Ability to participate in self-care as condition permits will improve Outcome: Progressing   Problem: Self-Care: Goal: Verbalization of feelings and concerns over difficulty with self-care will improve Outcome: Progressing   Problem: Self-Care: Goal: Ability to communicate needs accurately will improve Outcome: Progressing   Problem: Nutrition: Goal: Risk of aspiration will decrease Outcome: Progressing   Problem: Nutrition: Goal: Dietary intake will improve Outcome: Progressing   Problem: Activity: Goal: Risk for activity intolerance will decrease Outcome: Progressing   Problem: Nutrition: Goal: Adequate nutrition will be maintained Outcome: Progressing

## 2022-08-07 NOTE — H&P (Signed)
History and Physical    Scott Spence OIT:[id number] DOB: September 10, 1950 DOA: 08/07/2022  PCP: [place] Pink, MD  Patient coming from: home  I have personally briefly reviewed patient's old medical records in [place]  Chief Complaint: dizziness x 24 hours   HPI: Scott Spence is a 72 y.o. male with medical history significant of  BPH, CKDIII, GERD, Gout, Hypertension who presents to ED with 24 hours of severe vertigo associated with gait disturbance due to feeling  of balanced. Patient notes no history of uri sxs, fever/chills/ cough / body aches /diarrhea/ dysuria/blood in stool or urine. He notes symptoms worse with movement. He describes his dizziness as being of balance even while in bed. He notes that he does not feel like the room is spinning. He notes associated, vision loss or blurry vision but does when looking to the left or right feels his vision is not as focused.He notes no paresthesias , or feeling of presyncope.  ED Course:  Afeb, bp 195/120, hr 73, rr 18 , sat 98% on ra a  RAX:ENMM , lvh  Wbc: 6.9, hgb 17.1, plt 160  Na 135, K 3.9, glu 126 ,c r 0.99 Etoh <10 Inr 1.1 Ce 7 CTH: NAD MRI BRAIN:NEG CTA: 1. 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 2. 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 Right vertebral artery is non dominant. 3. 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  Discussed with Dr Parke Simmers on call neurology will see in am  Preliminary Rec 300 plavix, followed by '75mg'$   daily  Tx meclizine Review of Systems: As per HPI otherwise 10 point review of systems negative.   Past Medical History:  Diagnosis Date   BPH with urinary obstruction    Chronic kidney disease    hemorrhagic nephritis as a child   Complication of anesthesia    collapsed lung with local for LUE surgery   GERD (gastroesophageal reflux disease)    Gout    History of kidney stones    Hypertension     Past Surgical History:  Procedure Laterality  Date   APPENDECTOMY     FOOT ARTHRODESIS Right 03/29/2022   Procedure: SUBTALAR ARTHRODESIS;  Surgeon: Wylene Simmer, MD;  Location: [place] [facility];  Service: Orthopedics;  Laterality: Right;   HERNIA REPAIR     HOLEP-LASER ENUCLEATION OF THE PROSTATE WITH MORCELLATION N/A 03/09/2022   Procedure: HOLEP-LASER ENUCLEATION OF THE PROSTATE WITH MORCELLATION;  Surgeon: Billey Co, MD;  Location: [facility];  Service: Urology;  Laterality: N/A;   KIDNEY STONE SURGERY     PROSTATECTOMY     TOTAL HIP ARTHROPLASTY Right 04/01/2018   Procedure: TOTAL HIP ARTHROPLASTY ANTERIOR APPROACH;  Surgeon: Hessie Knows, MD;  Location: [facility];  Service: Orthopedics;  Laterality: Right;     reports that he has never smoked. He has never been exposed to tobacco smoke. He has never used smokeless tobacco. He reports current alcohol use of about 14.0 standard drinks of alcohol per week. He reports that he does not use drugs.  Allergies  Allergen Reactions   Meperidine Other (See Comments)    GI Upset   Sulfa Antibiotics Hives   Gabapentin Other (See Comments)    Migraine HA   Lisinopril Cough    Family History  Problem Relation Age of Onset   Prostate cancer Neg Hx    Bladder Cancer Neg Hx    Kidney cancer Neg Hx     Prior to Admission medications  Medication Sig Start Date End Date Taking? Authorizing Provider  allopurinol (ZYLOPRIM) 300 MG tablet Take 300 mg by mouth daily.  10/10/16  Yes [provider]  losartan (COZAAR) 100 MG tablet Take 100 mg by mouth daily.  10/10/16  Yes [provider]  omeprazole (PRILOSEC) 20 MG capsule Take 20 mg by mouth daily.  10/10/16  Yes [provider]  rosuvastatin (CRESTOR) 5 MG tablet Take 5 mg by mouth daily. 11/30/19  Yes [provider]  aspirin EC 81 MG tablet Take 1 tablet by mouth 2 (two) times daily.    [provider]  calcium gluconate 500 MG tablet Take 1 tablet by mouth daily.    [provider]  Coenzyme Q-10 200 MG CAPS Take 200 mg by mouth daily.     [provider]  Ergocalciferol (VITAMIN D2) 2000 units TABS Take 2,000 Units by mouth daily.    [provider]  Glucosamine-Chondroit-Vit C-Mn (GLUCOSAMINE 1500 COMPLEX PO) Take 1,500 mg by mouth daily.    [provider]  Magnesium 250 MG TABS Take 250 mg by mouth daily.    [provider]  mometasone (NASONEX) 50 MCG/ACT nasal spray Place 1 spray into the nose 2 (two) times daily.  10/10/16   [provider]  Multiple Vitamin (MULTIVITAMIN) capsule Take 1 capsule by mouth daily.     [provider]  RESTASIS 0.05 % ophthalmic emulsion Place 2 drops into both eyes 2 (two) times daily.  06/28/17   [provider]    Physical Exam: Vitals:   08/07/22 1402 08/07/22 1403 08/07/22 1557 08/07/22 1830  BP:  (!) 195/120 (!) 136/95 130/88  Pulse:  73 78 80  Resp:  '18 18 18  '$ Temp:  97.8 F (36.6 C)  98 F (36.7 C)  TempSrc:  Oral  Oral  SpO2:  98% 98% 98%  Weight: 79.4 kg     Height: '5\' 7"'$  (1.702 m)       Constitutional: NAD, calm, comfortable Vitals:   08/07/22 1402 08/07/22 1403 08/07/22 1557 08/07/22 1830  BP:  (!) 195/120 (!) 136/95 130/88  Pulse:  73 78 80  Resp:  '18 18 18  '$ Temp:  97.8 F (36.6 C)  98 F (36.7 C)  TempSrc:  Oral  Oral  SpO2:  98% 98% 98%  Weight: 79.4 kg     Height: '5\' 7"'$  (1.702 m)      Eyes: PERRL, lids and conjunctivae normal ENMT: Mucous membranes are moist. Posterior pharynx clear of any exudate or lesions.Normal dentition.  Neck: normal, supple, no masses, no thyromegaly Respiratory: clear to auscultation bilaterally, no wheezing, no crackles. Normal respiratory effort. No accessory muscle use.  Cardiovascular: Regular rate and rhythm, no murmurs / rubs / gallops. No extremity edema. 2+ pedal pulses.  Abdomen: no tenderness, no masses palpated. No hepatosplenomegaly. Bowel sounds positive.  Musculoskeletal: no clubbing  / cyanosis. No joint deformity upper and lower extremities. Good ROM, no contractures. Normal muscle tone.  Skin: no rashes, lesions, ulcers. No induration Neurologic: CN 2-12 grossly intact. Sensation intact, DTR normal. Strength 5/5 in all 4. +mild horizontal saccades on looking directly to right and left Psychiatric: Normal judgment and insight. Alert and oriented x 3. Normal mood.    Labs on Admission: I have personally reviewed following labs and imaging studies  CBC: Recent Labs  Lab 08/07/22 1407  WBC 6.9  NEUTROABS 4.5  HGB 17.1*  HCT 50.1  MCV 93.5  PLT 160  Basic Metabolic Panel: Recent Labs  Lab 08/07/22 1407  NA 135  K 3.9  CL 101  CO2 25  GLUCOSE 126*  BUN 18  CREATININE 0.99  CALCIUM 9.7   GFR: Estimated Creatinine Clearance: 69.1 mL/min (by C-G formula based on SCr of 0.99 mg/dL). Liver Function Tests: Recent Labs  Lab 08/07/22 1407  AST 36  ALT 37  ALKPHOS 100  BILITOT 1.3*  PROT 7.8  ALBUMIN 4.7   No results for input(s): "LIPASE", "AMYLASE" in the last 168 hours. No results for input(s): "AMMONIA" in the last 168 hours. Coagulation Profile: Recent Labs  Lab 08/07/22 1407  INR 1.1   Cardiac Enzymes: No results for input(s): "CKTOTAL", "CKMB", "CKMBINDEX", "TROPONINI" in the last 168 hours. BNP (last 3 results) No results for input(s): "PROBNP" in the last 8760 hours. HbA1C: No results for input(s): "HGBA1C" in the last 72 hours. CBG: No results for input(s): "GLUCAP" in the last 168 hours. Lipid Profile: No results for input(s): "CHOL", "HDL", "LDLCALC", "TRIG", "CHOLHDL", "LDLDIRECT" in the last 72 hours. Thyroid Function Tests: No results for input(s): "TSH", "T4TOTAL", "FREET4", "T3FREE", "THYROIDAB" in the last 72 hours. Anemia Panel: No results for input(s): "VITAMINB12", "FOLATE", "FERRITIN", "TIBC", "IRON", "RETICCTPCT" in the last 72 hours. Urine analysis:    Component Value Date/Time   COLORURINE YELLOW (A) 03/17/2018  1329   APPEARANCEUR Clear 02/28/2022 1326   LABSPEC 1.018 03/17/2018 1329   PHURINE 5.0 03/17/2018 1329   GLUCOSEU Negative 02/28/2022 1326   HGBUR NEGATIVE 03/17/2018 1329   BILIRUBINUR Negative 02/28/2022 1326   KETONESUR NEGATIVE 03/17/2018 1329   PROTEINUR 1+ (A) 02/28/2022 1326   PROTEINUR NEGATIVE 03/17/2018 1329   NITRITE Negative 02/28/2022 1326   NITRITE NEGATIVE 03/17/2018 1329   LEUKOCYTESUR Negative 02/28/2022 1326    Radiological Exams on Admission: CT ANGIO HEAD NECK W WO CM  Result Date: 08/07/2022 CLINICAL DATA:  Stroke, follow up EXAM: CT ANGIOGRAPHY HEAD AND NECK TECHNIQUE: Multidetector CT imaging of the head and neck was performed using the standard protocol during bolus administration of intravenous contrast. Multiplanar CT image reconstructions and MIPs were obtained to evaluate the vascular anatomy. Carotid stenosis measurements (when applicable) are obtained utilizing NASCET criteria, using the distal internal carotid diameter as the denominator. RADIATION DOSE REDUCTION: This exam was performed according to the departmental dose-optimization program which includes automated exposure control, adjustment of the mA and/or kV according to patient size and/or use of iterative reconstruction technique. CONTRAST:  36m OMNIPAQUE IOHEXOL 350 MG/ML SOLN COMPARISON:  MRI and CT head from the same day. FINDINGS: CTA NECK FINDINGS Aortic arch: Great vessel origins are patent. 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 Right carotid system: Patent. Atherosclerosis at the carotid bifurcation involving the proximal ICA with resulting approximately 70% stenosis of the proximal ICA. Left carotid system: Atherosclerosis at the carotid bifurcation and involving the proximal ICA without greater than 50% stenosis. Vertebral arteries: Left dominant. Patent bilaterally. 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 Skeleton: No acute findings on limited assessment.  Multilevel degenerative change. Other neck: No acute findings on limited assessment.w Upper chest: Visualized lung apices are clear. Review of the MIP images confirms the above findings CTA HEAD FINDINGS Anterior circulation: Bilateral intracranial ICAs are patent with mild atherosclerotic narrowing. Hypoplastic right A1 ACA, anatomic variant. Otherwise, MCAs and ACAs are patent without proximal hemodynamically significant stenosis. Posterior circulation: Bilateral intradural vertebral arteries, basilar artery and bilateral posterior cerebral arteries are patent without proximal hemodynamically significant stenosis. Right fetal  type PCA, anatomic variant. Venous sinuses: As permitted by contrast timing, patent. Review of the MIP images confirms the above findings IMPRESSION: CTA head: No emergent large vessel occlusion or proximal hemodynamically significant stenosis. CTA neck: 1. 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 2. 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 Right vertebral artery is non dominant. 3. 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 Electronically Signed   By: Margaretha Sheffield M.D.   On: 08/07/2022 18:55   MR BRAIN WO CONTRAST  Result Date: 08/07/2022 CLINICAL DATA:  Provided history: Neuro deficit, acute, stroke suspected. Additional history provided: Dizziness, loss of balance, elevated blood pressure. EXAM: MRI HEAD WITHOUT CONTRAST TECHNIQUE: Multiplanar, multiecho pulse sequences of the brain and surrounding structures were obtained without intravenous contrast. COMPARISON:  Head CT 08/07/2022. FINDINGS: Brain: No age advanced or lobar predominant parenchymal atrophy. Moderate multifocal T2 FLAIR hyperintense signal abnormality within the cerebral white matter, nonspecific but compatible with chronic small vessel disease. To a lesser extent, chronic small vessel ischemic changes are also present within the bilateral basal ganglia. There is no acute  infarct. No evidence of an intracranial mass. No extra-axial fluid collection. No midline shift. Vascular: Maintained flow voids within the proximal large arterial vessels. Skull and upper cervical spine: Focal suspicious marrow lesion. Incompletely assessed cervical spondylosis. Sinuses/Orbits: No mass or acute finding within the imaged orbits. Trace mucosal thickening within the right maxillary sinus. IMPRESSION: No evidence of acute intracranial abnormality. Moderate chronic small-vessel ischemic changes within the cerebral white matter. To a lesser extent, chronic small vessel ischemic changes are also present within the bilateral basal ganglia. Electronically Signed   By: Kellie Simmering D.O.   On: 08/07/2022 16:40   CT HEAD WO CONTRAST  Result Date: 08/07/2022 CLINICAL DATA:  Dizziness, loss of balance, hypertension EXAM: CT HEAD WITHOUT CONTRAST TECHNIQUE: Contiguous axial images were obtained from the base of the skull through the vertex without intravenous contrast. RADIATION DOSE REDUCTION: This exam was performed according to the departmental dose-optimization program which includes automated exposure control, adjustment of the mA and/or kV according to patient size and/or use of iterative reconstruction technique. COMPARISON:  None Available. FINDINGS: Brain: Scattered hypodensities within the basal ganglia and frontal periventricular white matter consistent with chronic small vessel ischemic changes. No signs of acute infarct or hemorrhage. Lateral ventricles and remaining midline structures are unremarkable. No acute extra-axial fluid collections. No mass effect. Vascular: No hyperdense vessel or unexpected calcification. Skull: Normal. Negative for fracture or focal lesion. Sinuses/Orbits: No acute finding. Other: None. IMPRESSION: 1. Scattered hypodensities within the frontal periventricular white matter and bilateral basal ganglia, most consistent with chronic small vessel ischemic change. 2. No  acute intracranial process. Electronically Signed   By: Randa Ngo M.D.   On: 08/07/2022 15:21    EKG: Independently reviewed. As noted above  Assessment/Plan  Vertigo presume peripheral  -less likely  central cause , with negative, negative MRI CTA with mod to severe disease in posterior circulation although no emergent large vessel occlusion or proximal hemodynamically significant stenosis -per neurology due to concern for possible central vertigo will  -load with '300mg'$  plavix if followed by '75mg'$  po daily and admit TIA protocol.  -continue asa  -check lipid panel  - place on tia protocol  -SLP/PT/OT/ Echo /neuro checks   Essential HTN  -initially uncontrolled now stable with systolic 371/062 -will hold for now and resume in am   GERD -ppi    DVT prophylaxis: heparin Code Status:  full Family Communication: wife at bedside Disposition Plan:patient  expected to be admitted less than 2 midnights  Consults called: Neurology Dr Parke Simmers Admission status: med/tele   Clance Boll MD [facility]   If 7PM-7AM, please contact night-coverage [email or web address] Password [facility]  08/07/2022, 7:14 PM

## 2022-08-07 NOTE — Plan of Care (Signed)
Discussed briefly with EDP. No emergent findings on CTA. Recommend Dix-Hallpike / Eppley given significant positional description to evaluate for BPPV. Also HINTS exam (if test of skew positive or direction changing nystagmus assume brainstem negative stroke and admit for stroke/TIA workup). If no contraindications, Plavix 300 mg load followed by 75 mg daily, course to be determined based on full eval.

## 2022-08-08 ENCOUNTER — Inpatient Hospital Stay
Admit: 2022-08-08 | Discharge: 2022-08-08 | Disposition: A | Discharge status: Home / Self Care | Payer: Medicare HMO | Attending: Internal Medicine | Admitting: Internal Medicine

## 2022-08-08 DIAGNOSIS — H81391 Other peripheral vertigo, right ear: Secondary | ICD-10-CM

## 2022-08-08 DIAGNOSIS — I639 Cerebral infarction, unspecified: Secondary | ICD-10-CM

## 2022-08-08 LAB — HEMOGLOBIN A1C
Hgb A1c MFr Bld: 5.4 % (ref 4.8–5.6)
Mean Plasma Glucose: 108.28 mg/dL

## 2022-08-08 LAB — CBC
HCT: 45.5 % (ref 39.0–52.0)
Hemoglobin: 15.3 g/dL (ref 13.0–17.0)
MCH: 31.7 pg (ref 26.0–34.0)
MCHC: 33.6 g/dL (ref 30.0–36.0)
MCV: 94.2 fL (ref 80.0–100.0)
Platelets: 141 10*3/uL — ABNORMAL LOW (ref 150–400)
RBC: 4.83 MIL/uL (ref 4.22–5.81)
RDW: 13.2 % (ref 11.5–15.5)
WBC: 5.8 10*3/uL (ref 4.0–10.5)
nRBC: 0 % (ref 0.0–0.2)

## 2022-08-08 LAB — LIPID PANEL
Cholesterol: 151 mg/dL (ref 0–200)
HDL: 50 mg/dL (ref >40)
LDL Cholesterol: 70 mg/dL (ref 0–99)
Total CHOL/HDL Ratio: 3 RATIO
Triglycerides: 156 mg/dL — ABNORMAL HIGH (ref <150)
VLDL: 31 mg/dL (ref 0–40)

## 2022-08-08 LAB — MAGNESIUM: Magnesium: 2 mg/dL (ref 1.7–2.4)

## 2022-08-08 LAB — BASIC METABOLIC PANEL
Anion gap: 10 (ref 5–15)
BUN: 15 mg/dL (ref 8–23)
CO2: 22 mmol/L (ref 22–32)
Calcium: 8.8 mg/dL — ABNORMAL LOW (ref 8.9–10.3)
Chloride: 106 mmol/L (ref 98–111)
Creatinine, Ser: 1.03 mg/dL (ref 0.61–1.24)
GFR, Estimated: >60 mL/min (ref >60)
Glucose, Bld: 110 mg/dL — ABNORMAL HIGH (ref 70–99)
Potassium: 3.9 mmol/L (ref 3.5–5.1)
Sodium: 138 mmol/L (ref 135–145)

## 2022-08-08 LAB — PHOSPHORUS: Phosphorus: 3.7 mg/dL (ref 2.5–4.6)

## 2022-08-08 MED ORDER — MECLIZINE HCL 25 MG PO TABS
25.0000 mg | ORAL_TABLET | Freq: Three times a day (TID) | ORAL | Status: DC
  Administered 2022-08-08 – 2022-08-09 (×4): 25 mg via ORAL
  Filled 2022-08-08 (×6): qty 1

## 2022-08-08 MED ORDER — LORATADINE 10 MG PO TABS
10.0000 mg | ORAL_TABLET | Freq: Every day | ORAL | Status: DC | PRN
  Administered 2022-08-08 – 2022-08-09 (×2): 10 mg via ORAL
  Filled 2022-08-08 (×3): qty 1

## 2022-08-08 NOTE — Evaluation (Signed)
Physical Therapy Evaluation Patient Details Name: Scott Spence MRN: [id number] DOB: 04/05/1951 Today's Date: 08/08/2022  History of Present Illness  Patient is a 72 year old with complaints of dizziness and unsteady gait. Work up ongoing  Clinical Impression  Patient reports he was independent with mobility prior to admission. He reports continued dizziness and feeling off balance with standing/ambulation. He reports the dizziness is persistent (even with resting in bed) but does worsen with bending forward and standing. He reports the room is not spinning but describes the dizziness as "almost lightheaded." Orthostatic vitals were negative when checked earlier today. Eyes remain on the target after the examiners movement with head impulsive test. No nystagmus is noted with eye movement in all quadrants and with head turns. The patient was relying heavily on the rolling walker for support with ambulation but was able to walk in the hallway with no physical assistance. Encouraged patient to continue using rolling walker for support with ambulation for safety. PT will continue to follow.      Recommendations for follow up therapy are one component of a multi-disciplinary discharge planning process, led by the attending physician.  Recommendations may be updated based on patient status, additional functional criteria and insurance authorization.  Follow Up Recommendations Outpatient PT (ongoing assessment)      Assistance Recommended at Discharge PRN  Patient can return home with the following  Help with stairs or ramp for entrance;Assist for transportation;A little help with walking and/or transfers    Equipment Recommendations None recommended by PT  Recommendations for Other Services       Functional Status Assessment Patient has had a recent decline in their functional status and demonstrates the ability to make significant improvements in function in a reasonable and predictable amount of  time.     Precautions / Restrictions Precautions Precautions: Fall Restrictions Weight Bearing Restrictions: No      Mobility  Bed Mobility Overal bed mobility: Modified Independent                  Transfers Overall transfer level: Needs assistance Equipment used: Rolling walker (2 wheels) Transfers: Sit to/from Stand Sit to Stand: Min guard           General transfer comment: Min gaurd for safety. patient reports persistent dizziness with all activity that worsens with bending forward and standing    Ambulation/Gait Ambulation/Gait assistance: Min guard Gait Distance (Feet): 100 Feet Assistive device: Rolling walker (2 wheels) Gait Pattern/deviations: Step-through pattern Gait velocity: decrease     General Gait Details: slow but steady cadence. patient does report increased dizziness intermittently that does not seem to be associated with head turns. discussed fixating on one spot with mobility with no improvement in symptoms. encouraged patient to continue using rolling walker for safety with ambulation at this time  Stairs            Wheelchair Mobility    Modified Rankin (Stroke Patients Only)       Balance Overall balance assessment: Needs assistance Sitting-balance support: Feet supported Sitting balance-Leahy Scale: Good     Standing balance support: Bilateral upper extremity supported, Reliant on assistive device for balance Standing balance-Leahy Scale: Fair Standing balance comment: patient relying heavily on rolling walker for support in standing                             Pertinent Vitals/Pain Pain Assessment Pain Assessment: No/denies pain    Home Living  Family/patient expects to be discharged to:: Private residence Living Arrangements: Spouse/significant other Available Help at Discharge: Family Type of Home: House Home Access: Stairs to enter Entrance Stairs-Rails: Left Entrance Stairs-Number of Steps: 4    Home Layout: One level [place]: Conservation officer, nature (2 wheels);Cane - single point;Shower seat;BSC/3in1      Prior Function Prior Level of Function : Independent/Modified Independent;Driving                     Hand Dominance        Extremity/Trunk Assessment   Upper Extremity Assessment Upper Extremity Assessment: Overall WFL for tasks assessed    Lower Extremity Assessment Lower Extremity Assessment: Overall WFL for tasks assessed       Communication   Communication: No difficulties  Cognition Arousal/Alertness: Awake/alert Behavior During Therapy: WFL for tasks assessed/performed Overall Cognitive Status: Within Functional Limits for tasks assessed                                          General Comments General comments (skin integrity, edema, etc.): eyes remain on the target after the examiners movement with head impulsive test. no nystagmus is noted with eye movement in all quadrants and with head turns    Exercises     Assessment/Plan    PT Assessment Patient needs continued PT services  PT Problem List Decreased balance;Decreased mobility;Decreased safety awareness       PT Treatment Interventions DME instruction;Gait training;Stair training;Functional mobility training;Therapeutic activities;Therapeutic exercise;Neuromuscular re-education;Balance training;Cognitive remediation;Patient/family education    PT Goals (Current goals can be found in the Care Plan section)  Acute Rehab PT Goals Patient Stated Goal: to have no dizziness PT Goal Formulation: With patient Time For Goal Achievement: 08/22/22 Potential to Achieve Goals: Good    Frequency Min 2X/week     Co-evaluation PT/OT/SLP Co-Evaluation/Treatment: Yes Reason for Co-Treatment: To address functional/ADL transfers PT goals addressed during session: Mobility/safety with mobility         AM-PAC PT "6 Clicks" Mobility  Outcome Measure Help needed turning from  your back to your side while in a flat bed without using bedrails?: None Help needed moving from lying on your back to sitting on the side of a flat bed without using bedrails?: None Help needed moving to and from a bed to a chair (including a wheelchair)?: A Little Help needed standing up from a chair using your arms (e.g., wheelchair or bedside chair)?: A Little Help needed to walk in hospital room?: A Little Help needed climbing 3-5 steps with a railing? : A Little 6 Click Score: 20    End of Session   Activity Tolerance: Patient tolerated treatment well (limited by dizziness) Patient left: in bed;with call bell/phone within reach;with bed alarm set;with family/visitor present Nurse Communication: Mobility status PT Visit Diagnosis: Unsteadiness on feet (R26.81);Difficulty in walking, not elsewhere classified (R26.2)    Time: 7619-5093 PT Time Calculation (min) (ACUTE ONLY): 21 min   Charges:   PT Evaluation $PT Eval Low Complexity: 1 Low PT Treatments $Therapeutic Activity: 8-22 mins        Minna Merritts, PT, MPT   Percell Locus 08/08/2022, 3:27 PM

## 2022-08-08 NOTE — Progress Notes (Signed)
Pt stated that he is dizzy this morning when laying down and especially when standing. Pt using walker and close supervision when OOB.

## 2022-08-08 NOTE — Progress Notes (Signed)
*  PRELIMINARY RESULTS* Echocardiogram 2D Echocardiogram has been performed.  Scott Spence 08/08/2022, 12:34 PM

## 2022-08-08 NOTE — Progress Notes (Signed)
SLP Cancellation Note  Patient Details Name: Scott Spence MRN: [id number] DOB: 06-Jun-1951   Cancelled treatment:       Reason Eval/Treat Not Completed: SLP screened, no needs identified, will sign off (chart reviewed; consulted NSG and met w/ pt in room)  Pt denied any difficulty swallowing and is currently on a regular diet; tolerates swallowing pills w/ water per NSG. Pt conversed in conversation w/out expressive/receptive deficits noted; pt denied any speech-language deficits. Speech clear. Pt using his phone for texting w/ no complaints/deficits.  No further skilled ST services indicated as pt appears at his baseline. Pt agreed. NSG to reconsult if any change in status while admitted.      [name], MS, CCC-SLP Speech Language Pathologist Rehab Services; [place] [phone] (ascom) [name] 08/08/2022, 10:19 AM

## 2022-08-08 NOTE — Progress Notes (Signed)
[facility] Progress Note  Patient: Scott Spence    VQQ:[id number]  DOA: 08/07/2022     Date of Service: the patient was seen and examined on 08/08/2022  Chief Complaint  Patient presents with   Dizziness   Brief hospital course: Marinus Eicher is a 72 y.o. male with medical history significant of  BPH, CKDIII, GERD, Gout, Hypertension who presents to ED with 24 hours of severe vertigo associated with gait disturbance due to feeling  of balanced. Patient notes no history of uri sxs, fever/chills/ cough / body aches /diarrhea/ dysuria/blood in stool or urine. He notes symptoms worse with movement. He describes his dizziness as being of balance even while in bed. He notes that he does not feel like the room is spinning. He notes associated, vision loss or blurry vision but does when looking to the left or right feels his vision is not as focused.He notes no paresthesias , or feeling of presyncope.   ED Course:  Afeb, bp 195/120, hr 73, rr 18 , sat 98% on ra a  EPP:IRJJ , lvh  Wbc: 6.9, hgb 17.1, plt 160  Na 135, K 3.9, glu 126 ,c r 0.99 Etoh <10 Inr 1.1 Ce 7 CTH: NAD MRI BRAIN:NEG CTA: 1. 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 2. 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 Right vertebral artery is non dominant. 3. 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   Discussed with Dr Parke Simmers on call neurology will see in am  Preliminary Rec 300 plavix, followed by '75mg'$   daily  Tx meclizine   Assessment and Plan:  Vertigo presume peripheral  -less likely  central cause , with negative MRI MRI No evidence of acute intracranial abnormality. Moderate chronic small-vessel ischemic changes within the cerebral white matter. To a lesser extent, chronic small vessel ischemic changes are also present within the bilateral basal ganglia. CTA with mod to severe disease in posterior circulation although no emergent large vessel occlusion or proximal  hemodynamically significant stenosis -per neurology due to concern for possible central vertigo will  -load with '300mg'$  plavix if followed by '75mg'$  po daily and admit TIA protocol. Recommend Dix-Hallpike / Eppley given significant positional description to evaluate for BPPV. Also HINTS exam (if test of skew positive or direction changing nystagmus assume brainstem negative stroke and admit for stroke/TIA workup)  -continue asa  Increase meclizine 25 mg p.o. 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  Essential HTN  -initially uncontrolled now stable with systolic 884/166 Patient stopped taking losartan, currently not on any medication at home We will continue to monitor BP and titrate medications accordingly   GERD -ppi    Body mass index is 27.42 kg/m.  Interventions:    Diet: Heart healthy diet DVT Prophylaxis: Subcutaneous Heparin    Advance goals of care discussion: Full code  Family Communication: family was not present at bedside, at the time of interview.  The pt provided permission to discuss medical plan with the family. Opportunity was given to ask question and all questions were answered satisfactorily.   Disposition:  Pt is from Home, admitted with vertigo, feeling dizzy, still has vertigo, at risk  of fall, which precludes a safe discharge. Discharge to home, when stable and cleared by neurology..  Subjective: No significant events overnight, patient still feels dizzy, more dizzy while moving his head on the right side, denies room spinning, and just describing as feeling dizzy, more symptoms while sitting and standing up. Denies any chest  pain or palpitation, no any other active issues.  Physical Exam: General: NAD, lying comfortably Appear in no distress, affect appropriate Eyes: PERRLA ENT: Oral Mucosa Clear, moist  Neck: no JVD,  Cardiovascular: S1 and S2 Present, no Murmur,  Respiratory: good respiratory effort, Bilateral Air entry equal and Decreased, no Crackles, no wheezes Abdomen: Bowel Sound present, Soft and no tenderness,  Skin: no rashes Extremities: no Pedal edema, no calf tenderness Neurologic: without any new focal findings Gait not checked due to patient safety concerns  Vitals:   08/08/22 0048 08/08/22 0410 08/08/22 0726 08/08/22 1135  BP: (!) 148/93 135/88 (!) 156/95 (!) 137/92  Pulse: 81 68 67 81  Resp: '16 18 16 18  '$ Temp: 98 F (36.7 C) 97.7 F (36.5 C) 97.8 F (36.6 C) 98.8 F (37.1 C)  TempSrc: Oral Oral    SpO2: 94% 93% 97% 94%  Weight:      Height:        Intake/Output Summary (Last 24 hours) at 08/08/2022 1221 Last data filed at 08/08/2022 0300 Gross per 24 hour  Intake 267.47 ml  Output --  Net 267.47 ml   Filed Weights   08/07/22 1402  Weight: 79.4 kg    Data Reviewed: I have personally reviewed and interpreted daily labs, tele strips, imagings as discussed above. I reviewed all nursing notes, pharmacy notes, vitals, pertinent old records I have discussed plan of care as described above with RN and patient/family.  CBC: Recent Labs  Lab 08/07/22 1407 08/08/22 0829  WBC 6.9 5.8  NEUTROABS 4.5  --   HGB 17.1* 15.3  HCT 50.1 45.5  MCV 93.5 94.2  PLT 160 902*   Basic Metabolic Panel: Recent Labs  Lab 08/07/22 1407 08/08/22 0829  NA 135 138  K 3.9 3.9  CL 101 106  CO2 25 22  GLUCOSE 126* 110*  BUN 18 15  CREATININE 0.99 1.03  CALCIUM 9.7 8.8*  MG  --  2.0  PHOS  --  3.7    Studies: CT ANGIO HEAD NECK W WO CM  Result Date: 08/07/2022 CLINICAL DATA:  Stroke, follow up EXAM: CT ANGIOGRAPHY HEAD AND NECK TECHNIQUE: Multidetector CT imaging of the head and  neck was performed using the standard protocol during bolus administration of intravenous contrast. Multiplanar CT image reconstructions and MIPs were obtained to evaluate the vascular anatomy. Carotid stenosis measurements (when applicable) are obtained utilizing NASCET criteria, using the distal internal carotid diameter as the denominator. RADIATION DOSE REDUCTION: This exam was performed according to the departmental dose-optimization program which includes automated exposure control, adjustment of the mA and/or kV according to patient size and/or use of iterative reconstruction technique. CONTRAST:  43m OMNIPAQUE IOHEXOL 350 MG/ML SOLN COMPARISON:  MRI and CT head from the same day. FINDINGS: CTA NECK FINDINGS Aortic arch: Great vessel origins are patent. 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 Right carotid system: Patent. Atherosclerosis at the carotid bifurcation involving the proximal ICA with resulting approximately 70% stenosis of the proximal ICA. Left carotid system: Atherosclerosis at the carotid bifurcation and involving the proximal ICA without greater than 50% stenosis. Vertebral arteries: Left dominant. Patent bilaterally. Severe right and mild  left vertebral artery origin stenosis. Skeleton: No acute findings on limited assessment. Multilevel degenerative change. Other neck: No acute findings on limited assessment.w Upper chest: Visualized lung apices are clear. Review of the MIP images confirms the above findings CTA HEAD FINDINGS Anterior circulation: Bilateral intracranial ICAs are patent with mild atherosclerotic narrowing. Hypoplastic right A1 ACA, anatomic variant. Otherwise, MCAs and ACAs are patent without proximal hemodynamically significant stenosis. Posterior circulation: Bilateral intradural vertebral arteries, basilar artery and bilateral posterior cerebral arteries are patent without proximal hemodynamically significant stenosis. Right fetal type PCA, anatomic variant.  Venous sinuses: As permitted by contrast timing, patent. Review of the MIP images confirms the above findings IMPRESSION: CTA head: No emergent large vessel occlusion or proximal hemodynamically significant stenosis. CTA neck: 1. 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 2. 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 Right vertebral artery is non dominant. 3. 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 Electronically Signed   By: Margaretha Sheffield M.D.   On: 08/07/2022 18:55   MR BRAIN WO CONTRAST  Result Date: 08/07/2022 CLINICAL DATA:  Provided history: Neuro deficit, acute, stroke suspected. Additional history provided: Dizziness, loss of balance, elevated blood pressure. EXAM: MRI HEAD WITHOUT CONTRAST TECHNIQUE: Multiplanar, multiecho pulse sequences of the brain and surrounding structures were obtained without intravenous contrast. COMPARISON:  Head CT 08/07/2022. FINDINGS: Brain: No age advanced or lobar predominant parenchymal atrophy. Moderate multifocal T2 FLAIR hyperintense signal abnormality within the cerebral white matter, nonspecific but compatible with chronic small vessel disease. To a lesser extent, chronic small vessel ischemic changes are also present within the bilateral basal ganglia. There is no acute infarct. No evidence of an intracranial mass. No extra-axial fluid collection. No midline shift. Vascular: Maintained flow voids within the proximal large arterial vessels. Skull and upper cervical spine: Focal suspicious marrow lesion. Incompletely assessed cervical spondylosis. Sinuses/Orbits: No mass or acute finding within the imaged orbits. Trace mucosal thickening within the right maxillary sinus. IMPRESSION: No evidence of acute intracranial abnormality. Moderate chronic small-vessel ischemic changes within the cerebral white matter. To a lesser extent, chronic small vessel ischemic changes are also present within the bilateral basal ganglia.  Electronically Signed   By: Kellie Simmering D.O.   On: 08/07/2022 16:40   CT HEAD WO CONTRAST  Result Date: 08/07/2022 CLINICAL DATA:  Dizziness, loss of balance, hypertension EXAM: CT HEAD WITHOUT CONTRAST TECHNIQUE: Contiguous axial images were obtained from the base of the skull through the vertex without intravenous contrast. RADIATION DOSE REDUCTION: This exam was performed according to the departmental dose-optimization program which includes automated exposure control, adjustment of the mA and/or kV according to patient size and/or use of iterative reconstruction technique. COMPARISON:  None Available. FINDINGS: Brain: Scattered hypodensities within the basal ganglia and frontal periventricular white matter consistent with chronic small vessel ischemic changes. No signs of acute infarct or hemorrhage. Lateral ventricles and remaining midline structures are unremarkable. No acute extra-axial fluid collections. No mass effect. Vascular: No hyperdense vessel or unexpected calcification. Skull: Normal. Negative for fracture or focal lesion. Sinuses/Orbits: No acute finding. Other: None. IMPRESSION: 1. Scattered hypodensities within the frontal periventricular white matter and bilateral basal ganglia, most consistent with chronic small vessel ischemic change. 2. No acute intracranial process. Electronically Signed   By: Randa Ngo M.D.   On: 08/07/2022 15:21    Scheduled Meds:   stroke: early stages of recovery book   Does not apply Once   allopurinol  300 mg Oral Daily  aspirin EC  81 mg Oral Daily   clopidogrel  75 mg Oral Daily   heparin  5,000 Units Subcutaneous Q8H   meclizine  25 mg Oral TID   pantoprazole  40 mg Oral Daily   rosuvastatin  5 mg Oral Daily   Continuous Infusions:  sodium chloride 75 mL/hr at 08/07/22 2325   PRN Meds: acetaminophen **OR** acetaminophen (TYLENOL) oral liquid 160 mg/5 mL **OR** acetaminophen  Time spent: 35 minutes  Author: Val Riles. MD [facility] 08/08/2022 12:21 PM  To reach On-call, see care teams to locate the attending and reach out to them via [email or web address]. If 7PM-7AM, please contact night-coverage If you still have difficulty reaching the attending provider, please page the [facility] (Director on Call) for [facility] on amion for assistance.

## 2022-08-08 NOTE — Evaluation (Signed)
Occupational Therapy Evaluation Patient Details Name: Scott Spence MRN: [id number] DOB: [birth year] Today's Date: 08/08/2022   History of Present Illness Pt is a 72 year old male presenting to the ED with 24 hours of severe vertigo associated with gait disturbance due to feeling  off balanced; PMH significant for BPH, CKDIII, GERD, Gout, Hypertension   Clinical Impression   Chart reviewed, pt greeted in bed with wife present, agreeable to OT evaluation. Pt is alert and oriented x4, appropriate awareness of deficits. PTA pt is indep in ADL/IADL, amb with no AD. Pt presents with deficits in activity tolerance and balance due to reported dizziness/light headedness affecting safe and optimal ADL completion. CGA required for amb with RW, toilet transfers. MOD A required for LB dressing due to dizziness. No nystagmus noted with positional changes and head turns. Of note, orthostatics negative taken by nursing earlier. OT will follow acutely, no OT follow up after discharge.      Recommendations for follow up therapy are one component of a multi-disciplinary discharge planning process, led by the attending physician.  Recommendations may be updated based on patient status, additional functional criteria and insurance authorization.   Follow Up Recommendations  No OT follow up     Assistance Recommended at Discharge PRN  Patient can return home with the following A little help with bathing/dressing/bathroom    Functional Status Assessment  Patient has had a recent decline in their functional status and demonstrates the ability to make significant improvements in function in a reasonable and predictable amount of time.  Equipment Recommendations  None recommended by OT    Recommendations for Other Services       Precautions / Restrictions Precautions Precautions: Fall Restrictions Weight Bearing Restrictions: No      Mobility Bed Mobility Overal bed mobility: Modified Independent                   Transfers Overall transfer level: Needs assistance Equipment used: Rolling walker (2 wheels) Transfers: Sit to/from Stand Sit to Stand: Min guard                  Balance Overall balance assessment: Needs assistance Sitting-balance support: Feet supported Sitting balance-Leahy Scale: Good     Standing balance support: Bilateral upper extremity supported, Reliant on assistive device for balance Standing balance-Leahy Scale: Fair                             ADL either performed or assessed with clinical judgement   ADL Overall ADL's : Needs assistance/impaired Eating/Feeding: Set up   Grooming: Set up           Upper Body Dressing : Set up Upper Body Dressing Details (indicate cue type and reason): anticipated Lower Body Dressing: Moderate assistance Lower Body Dressing Details (indicate cue type and reason): due to light headedness/dizziness when lowering his head Toilet Transfer: Supervision/safety;Rolling walker (2 wheels);Ambulation;Min guard           Functional mobility during ADLs: Min guard;Supervision/safety;Rolling walker (2 wheels)       Vision Patient Visual Report: Other (comment) (light headed/dizziness with position changes, no reported vision changes) Additional Comments: please refer to PT note- pt tracking within all quarants, no nystagmus noted throughout. Increased lightheaded/dizzniess with positional changes     Perception     Praxis      Pertinent Vitals/Pain Pain Assessment Pain Assessment: No/denies pain     Hand Dominance  Extremity/Trunk Assessment Upper Extremity Assessment Upper Extremity Assessment: Overall WFL for tasks assessed   Lower Extremity Assessment Lower Extremity Assessment: Overall WFL for tasks assessed       Communication Communication Communication: No difficulties   Cognition Arousal/Alertness: Awake/alert Behavior During Therapy: WFL for tasks  assessed/performed Overall Cognitive Status: Within Functional Limits for tasks assessed                                       General Comments  eyes remain on the target after the examiners movement with head impulsive test. no nystagmus is noted with eye movement in all quadrants and with head turns    Exercises Other Exercises Other Exercises: edu re: role of OT, role of rehab, discharge recommendations, DME use, safe ADL completion   Shoulder Instructions      Home Living Family/patient expects to be discharged to:: Private residence Living Arrangements: Spouse/significant other Available Help at Discharge: Family Type of Home: House Home Access: Stairs to enter Technical brewer of Steps: 4 Entrance Stairs-Rails: Left Home Layout: One level     Bathroom Shower/Tub: Occupational psychologist: Handicapped height Bathroom Accessibility: Yes How Accessible: Accessible via walker Home Equipment: Conservation officer, nature (2 wheels);Cane - single point;Shower seat;BSC/3in1          Prior Functioning/Environment Prior Level of Function : Independent/Modified Independent;Driving                        OT Problem List: Decreased activity tolerance      OT Treatment/Interventions: Self-care/ADL training;Therapeutic exercise;DME and/or AE instruction;Therapeutic activities;Patient/family education    OT Goals(Current goals can be found in the care plan section) Acute Rehab OT Goals Patient Stated Goal: feel better OT Goal Formulation: With patient/family Time For Goal Achievement: 08/22/22 Potential to Achieve Goals: Good ADL Goals Pt Will Perform Lower Body Dressing: Independently Pt Will Transfer to Toilet: Independently  OT Frequency: Min 2X/week    Co-evaluation PT/OT/SLP Co-Evaluation/Treatment: Yes Reason for Co-Treatment: To address functional/ADL transfers PT goals addressed during session: Mobility/safety with mobility OT goals  addressed during session: ADL's and self-care      AM-PAC OT "6 Clicks" Daily Activity     Outcome Measure Help from another person eating meals?: None Help from another person taking care of personal grooming?: None Help from another person toileting, which includes using toliet, bedpan, or urinal?: None Help from another person bathing (including washing, rinsing, drying)?: A Little Help from another person to put on and taking off regular upper body clothing?: None Help from another person to put on and taking off regular lower body clothing?: A Little 6 Click Score: 22   End of Session Equipment Utilized During Treatment: Rolling walker (2 wheels)  Activity Tolerance: Patient tolerated treatment well Patient left: in bed;with call bell/phone within reach;with bed alarm set;with family/visitor present  OT Visit Diagnosis: Unsteadiness on feet (R26.81)                Time: 5053-9767 OT Time Calculation (min): 21 min Charges:  OT General Charges $OT Visit: 1 Visit OT Evaluation $OT Eval Low Complexity: 1 Low  Shanon Payor, OTD OTR/L  08/08/22, 4:15 PM

## 2022-08-08 NOTE — Consult Note (Addendum)
Neurology Consultation Reason for Consult: Dizziness Requesting Physician: Val Riles  CC: Dizziness  History is obtained from: Patient and chart review  HPI: Scott Spence is a 72 y.o. male with a past medical history significant for hypertension, hyperlipidemia, CKD, gout.  He was doing his usual daily weightlifting routine on Monday evening when he suddenly became dizzy as he was bending down to pick up weight and fell.  Subsequently he remained dizzy and presented to the ED for further evaluation.  He has difficulty characterizing the dizziness, reports he does not notice the room spinning but feels lightheaded, no nausea, diaphoresis, vision change such as vision going black or light.  He remains dizzy when he is laying still but it does worsen with movement.  He does not feel that the dizziness has improved overall since it started, but it has been stable.  He did recently have a right ankle surgery with 2 screws for which he is on aspirin 81 mg twice daily, and he has still been in physical therapy for this issue.  He also notes he has had a right hip replacement and had some chronic right shoulder pain  His blood pressure was elevated on ED arrival (190/120)  LKW: Monday evening Thrombolytic given?: No, out of the window IA performed?: No, exam not consistent with LVO Premorbid modified rankin scale:      0 - No symptoms.  ROS: All other review of systems was negative except as noted in the HPI.   Past Medical History:  Diagnosis Date   BPH with urinary obstruction    Chronic kidney disease    hemorrhagic nephritis as a child   Complication of anesthesia    collapsed lung with local for LUE surgery   GERD (gastroesophageal reflux disease)    Gout    History of kidney stones    Hypertension    Past Surgical History:  Procedure Laterality Date   APPENDECTOMY     FOOT ARTHRODESIS Right 03/29/2022   Procedure: SUBTALAR ARTHRODESIS;  Surgeon: Wylene Simmer, MD;  Location:  [place];  Service: Orthopedics;  Laterality: Right;   HERNIA REPAIR     HOLEP-LASER ENUCLEATION OF THE PROSTATE WITH MORCELLATION N/A 03/09/2022   Procedure: HOLEP-LASER ENUCLEATION OF THE PROSTATE WITH MORCELLATION;  Surgeon: Billey Co, MD;  Location: [facility];  Service: Urology;  Laterality: N/A;   KIDNEY STONE SURGERY     PROSTATECTOMY     TOTAL HIP ARTHROPLASTY Right 04/01/2018   Procedure: TOTAL HIP ARTHROPLASTY ANTERIOR APPROACH;  Surgeon: Hessie Knows, MD;  Location: [facility];  Service: Orthopedics;  Laterality: Right;   Current Outpatient Medications  Medication Instructions   allopurinol (ZYLOPRIM) 300 mg, Oral, Daily   aspirin EC 81 MG tablet 1 tablet, 2 times daily   calcium gluconate 500 MG tablet 1 tablet, Daily   Coenzyme Q-10 200 mg, Daily   Glucosamine-Chondroit-Vit C-Mn (GLUCOSAMINE 1500 COMPLEX PO) 1,500 mg, Daily   losartan (COZAAR) 100 mg, Oral, Daily   Magnesium 250 mg, Daily   mometasone (NASONEX) 50 MCG/ACT nasal spray 1 spray, 2 times daily   Multiple Vitamin (MULTIVITAMIN) capsule 1 capsule, Daily   omeprazole (PRILOSEC) 20 mg, Oral, Daily   RESTASIS 0.05 % ophthalmic emulsion 2 drops, 2 times daily   rosuvastatin (CRESTOR) 5 mg, Oral, Daily   Vitamin D2 2,000 Units, Daily    Family History  Problem Relation Age of Onset   Prostate cancer Neg Hx    Bladder Cancer Neg Hx  Kidney cancer Neg Hx     Social History:  reports that he has never smoked. He has never been exposed to tobacco smoke. He has never used smokeless tobacco. He reports current alcohol use of about 14.0 standard drinks of alcohol per week. He reports that he does not use drugs.   Exam: Current vital signs: BP (!) 156/95 (BP Location: Left Arm)   Pulse 67   Temp 97.8 F (36.6 C)   Resp 16   Ht '5\' 7"'$  (1.702 m)   Wt 79.4 kg   SpO2 97%   BMI 27.42 kg/m  Vital signs in last 24 hours: Temp:  [97.7 F (36.5 C)-98.1 F (36.7 C)] 97.8 F (36.6 C) (01/17  0726) Pulse Rate:  [67-81] 67 (01/17 0726) Resp:  [16-18] 16 (01/17 0726) BP: (130-195)/(88-120) 156/95 (01/17 0726) SpO2:  [93 %-100 %] 97 % (01/17 0726) Weight:  [79.4 kg] 79.4 kg (01/16 1402)   Physical Exam  Constitutional: Appears well-developed and well-nourished.  Appears fit Psych: Affect appropriate to situation, pleasant and cooperative Eyes: No scleral injection HENT: No oropharyngeal obstruction.  MSK: no joint deformities.  Cardiovascular: Normal rate and regular rhythm.  Perfusing extremities well Respiratory: Effort normal, non-labored breathing GI: Soft.  No distension. There is no tenderness.  Skin: Warm dry and intact visible skin  Neuro: Mental Status: Patient is awake, alert, oriented to person, place, month, year, and situation. Patient is able to give a clear and coherent history. No signs of aphasia or neglect Cranial Nerves: II: Visual Fields are full. Pupils are equal, round, and reactive to light.   III,IV, VI: EOMI without ptosis or diploplia.  However he has some saccadic intrusions notable at rest.  On sustained lateral gaze, upgaze, he does have a few beats of nystagmus which does appear to be direction changing and correlates with him noting some dizziness V: Facial sensation is symmetric to light touch VII: Facial movement is symmetric.  VIII: hearing is intact to voice X: Uvula elevates symmetrically XI: Shoulder shrug is symmetric. XII: tongue is midline without atrophy or fasciculations.  Motor: Tone is normal. Bulk is normal. 5/5 strength was present in all four extremities, except ankle dorsiflexion/plantarflexion testing deferred by patient due to recent surgery.  Sensory: Sensation is symmetric to light touch and temperature in the arms and legs. Deep Tendon Reflexes: 2+ on the left brachioradialis, biceps, patella, 3+ on the right brachioradialis, biceps, patella Cerebellar: FNF and HKS are intact bilaterally Gait:  Deferred  NIHSS  total 0 Performed at 7:40 PM  I have reviewed labs in epic and the results pertinent to this consultation are:  Basic Metabolic Panel: Recent Labs  Lab 08/07/22 1407 08/08/22 0829  NA 135 138  K 3.9 3.9  CL 101 106  CO2 25 22  GLUCOSE 126* 110*  BUN 18 15  CREATININE 0.99 1.03  CALCIUM 9.7 8.8*  MG  --  2.0  PHOS  --  3.7    CBC: Recent Labs  Lab 08/07/22 1407 08/08/22 0829  WBC 6.9 5.8  NEUTROABS 4.5  --   HGB 17.1* 15.3  HCT 50.1 45.5  MCV 93.5 94.2  PLT 160 141*    Coagulation Studies: Recent Labs    08/07/22 1407  LABPROT 13.6  INR 1.1     Lab Results  Component Value Date   CHOL 151 08/08/2022   HDL 50 08/08/2022   LDLCALC 70 08/08/2022   TRIG 156 (H) 08/08/2022   CHOLHDL 3.0 08/08/2022  Lab Results  Component Value Date   HGBA1C 5.4 08/07/2022     I have reviewed the images obtained:  MRI brain personally reviewed, agree with radiology:   No evidence of acute intracranial abnormality. Moderate chronic small-vessel ischemic changes within the cerebral white matter. To a lesser extent, chronic small vessel ischemic changes are also present within the bilateral basal ganglia.  CTA 1. 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 2. 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 Right vertebral artery is non dominant. 3. 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  Echocardiogram read pending  Impression: This is a 72 year old gentleman with a past medical history significant for CKD, hypertension, hyperlipidemia, BPH, GERD, gout, presenting with acute onset dizziness that has been persistent is present at rest and worsened with movement, with no other associated symptoms but examination notable for some direction changing nystagmus on my evaluation.  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 too small to be seen on MRI  Recommendations: # MRI negative brainstem stroke - Echocardiogram read still  pending  - If there is left atrial enlargement on his echocardiogram, I would like him to have 30-day event monitor (may be followed up by PCP) - Continue aspirin as per his surgical prophylaxis and then reduce to 81 mg daily lifelong - Plavix 300 mg load with 75 mg daily for 21 days - LDL meeting goal, continue Crestor 5 mg daily - A1c meeting goal at 5.4% - Blood pressure goal  -Patient is now 48 hours post acute stroke and recommend gradual normotension to be achieved over 3 to 5 days - PT consult, OT consult  - Neurology will follow-up echocardiogram read but otherwise be available as needed going forward, please reach out if any questions or concerns arise  Lesleigh Noe MD-PhD [facility] [phone] [facility] coverage for [facility] is from 8 AM to 4 AM in-house and 4 PM to 8 PM by telephone/video. 8 PM to 8 AM emergent questions or overnight urgent questions should be addressed to Teleneurology On-call or [facility] neurohospitalist; contact information can be found on AMION

## 2022-08-09 DIAGNOSIS — R42 Dizziness and giddiness: Secondary | ICD-10-CM

## 2022-08-09 LAB — BASIC METABOLIC PANEL
Anion gap: 12 (ref 5–15)
BUN: 19 mg/dL (ref 8–23)
CO2: 20 mmol/L — ABNORMAL LOW (ref 22–32)
Calcium: 9.2 mg/dL (ref 8.9–10.3)
Chloride: 105 mmol/L (ref 98–111)
Creatinine, Ser: 0.94 mg/dL (ref 0.61–1.24)
GFR, Estimated: >60 mL/min (ref >60)
Glucose, Bld: 101 mg/dL — ABNORMAL HIGH (ref 70–99)
Potassium: 3.4 mmol/L — ABNORMAL LOW (ref 3.5–5.1)
Sodium: 137 mmol/L (ref 135–145)

## 2022-08-09 LAB — ECHOCARDIOGRAM COMPLETE BUBBLE STUDY
AR max vel: 2.4 cm2
AV Area VTI: 2.42 cm2
AV Area mean vel: 2.36 cm2
AV Mean grad: 4 mmHg
AV Peak grad: 7.2 mmHg
Ao pk vel: 1.34 m/s
Area-P 1/2: 2.49 cm2
MV VTI: 3.16 cm2
S' Lateral: 3 cm

## 2022-08-09 LAB — CBC
HCT: 45.2 % (ref 39.0–52.0)
Hemoglobin: 15.6 g/dL (ref 13.0–17.0)
MCH: 32.2 pg (ref 26.0–34.0)
MCHC: 34.5 g/dL (ref 30.0–36.0)
MCV: 93.2 fL (ref 80.0–100.0)
Platelets: 153 10*3/uL (ref 150–400)
RBC: 4.85 MIL/uL (ref 4.22–5.81)
RDW: 13 % (ref 11.5–15.5)
WBC: 6.4 10*3/uL (ref 4.0–10.5)
nRBC: 0 % (ref 0.0–0.2)

## 2022-08-09 LAB — MAGNESIUM: Magnesium: 2.1 mg/dL (ref 1.7–2.4)

## 2022-08-09 LAB — PHOSPHORUS: Phosphorus: 2.9 mg/dL (ref 2.5–4.6)

## 2022-08-09 MED ORDER — LOSARTAN POTASSIUM 50 MG PO TABS
50.0000 mg | ORAL_TABLET | Freq: Every day | ORAL | Status: DC
  Administered 2022-08-09: 50 mg via ORAL
  Filled 2022-08-09: qty 1

## 2022-08-09 MED ORDER — CLOPIDOGREL BISULFATE 75 MG PO TABS: 75.0000 mg | ORAL_TABLET | Freq: Every day | ORAL | 0 refills | Status: AC

## 2022-08-09 MED ORDER — POTASSIUM CHLORIDE CRYS ER 20 MEQ PO TBCR
40.0000 meq | EXTENDED_RELEASE_TABLET | Freq: Once | ORAL | Status: AC
  Administered 2022-08-09: 40 meq via ORAL
  Filled 2022-08-09: qty 2

## 2022-08-09 MED ORDER — HYDRALAZINE HCL 20 MG/ML IJ SOLN
10.0000 mg | Freq: Once | INTRAMUSCULAR | Status: AC
  Administered 2022-08-09: 10 mg via INTRAVENOUS
  Filled 2022-08-09: qty 1

## 2022-08-09 MED ORDER — ASPIRIN 81 MG PO TBEC: 81.0000 mg | DELAYED_RELEASE_TABLET | Freq: Every day | ORAL | 12 refills | Status: AC

## 2022-08-09 NOTE — Plan of Care (Signed)
  Problem: Education: Goal: Knowledge of General Education information will improve Description: Including pain rating scale, medication(s)/side effects and non-pharmacologic comfort measures Outcome: Progressing   Problem: Health Behavior/Discharge Planning: Goal: Ability to manage health-related needs will improve Outcome: Progressing   Problem: Clinical Measurements: Goal: Ability to maintain clinical measurements within normal limits will improve Outcome: Progressing Goal: Will remain free from infection Outcome: Progressing Goal: Diagnostic test results will improve Outcome: Progressing Goal: Respiratory complications will improve Outcome: Progressing Goal: Cardiovascular complication will be avoided Outcome: Progressing   Problem: Activity: Goal: Risk for activity intolerance will decrease Outcome: Progressing   Problem: Nutrition: Goal: Adequate nutrition will be maintained Outcome: Progressing   Problem: Coping: Goal: Level of anxiety will decrease Outcome: Progressing   Problem: Elimination: Goal: Will not experience complications related to bowel motility Outcome: Progressing Goal: Will not experience complications related to urinary retention Outcome: Progressing   Problem: Pain Managment: Goal: General experience of comfort will improve Outcome: Progressing   Problem: Safety: Goal: Ability to remain free from injury will improve Outcome: Progressing   Problem: Skin Integrity: Goal: Risk for impaired skin integrity will decrease Outcome: Progressing   Problem: Education: Goal: Knowledge of disease or condition will improve Outcome: Progressing Goal: Knowledge of secondary prevention will improve (MUST DOCUMENT ALL) Outcome: Progressing Goal: Knowledge of patient specific risk factors will improve [place] N/A or DELETE if not current risk factor) Outcome: Progressing   Problem: Ischemic Stroke/TIA Tissue Perfusion: Goal: Complications of ischemic  stroke/TIA will be minimized Outcome: Progressing   Problem: Coping: Goal: Will verbalize positive feelings about self Outcome: Progressing Goal: Will identify appropriate support needs Outcome: Progressing   Problem: Health Behavior/Discharge Planning: Goal: Ability to manage health-related needs will improve Outcome: Progressing Goal: Goals will be collaboratively established with patient/family Outcome: Progressing   Problem: Self-Care: Goal: Ability to participate in self-care as condition permits will improve Outcome: Progressing Goal: Verbalization of feelings and concerns over difficulty with self-care will improve Outcome: Progressing Goal: Ability to communicate needs accurately will improve Outcome: Progressing   Problem: Nutrition: Goal: Risk of aspiration will decrease Outcome: Progressing Goal: Dietary intake will improve Outcome: Progressing   Problem: Education: Goal: Knowledge of disease or condition will improve Outcome: Progressing Goal: Knowledge of secondary prevention will improve (MUST DOCUMENT ALL) Outcome: Progressing Goal: Knowledge of patient specific risk factors will improve [place] N/A or DELETE if not current risk factor) Outcome: Progressing   Problem: Ischemic Stroke/TIA Tissue Perfusion: Goal: Complications of ischemic stroke/TIA will be minimized Outcome: Progressing   Problem: Coping: Goal: Will verbalize positive feelings about self Outcome: Progressing Goal: Will identify appropriate support needs Outcome: Progressing   Problem: Health Behavior/Discharge Planning: Goal: Ability to manage health-related needs will improve Outcome: Progressing Goal: Goals will be collaboratively established with patient/family Outcome: Progressing   Problem: Self-Care: Goal: Ability to participate in self-care as condition permits will improve Outcome: Progressing Goal: Verbalization of feelings and concerns over difficulty with self-care will  improve Outcome: Progressing Goal: Ability to communicate needs accurately will improve Outcome: Progressing   Problem: Nutrition: Goal: Risk of aspiration will decrease Outcome: Progressing Goal: Dietary intake will improve Outcome: Progressing

## 2022-08-09 NOTE — Progress Notes (Addendum)
Physical Therapy Treatment Patient Details Name: Scott Spence MRN: [id number] DOB: 04/15/1951 Today's Date: 08/09/2022   History of Present Illness Pt is a 72 year old male presenting to the ED with 24 hours of severe vertigo associated with gait disturbance due to feeling  off balanced; PMH significant for BPH, CKDIII, GERD, Gout, Hypertension    PT Comments    Patient is agreeable to PT session. He reports continued dizziness at rest. Educated patient on the importance for monitoring for increased dizziness with transitions during bed mobility, standing, etc for fall prevention at home. The patient increased gait distance significantly today with less dizziness reported overall with mobility that yesterday. Encouraged patient to continue using rolling walker at this time for safety with ambulation and he reports having one at home. Recommend outpatient PT follow up at discharge.    Recommendations for follow up therapy are one component of a multi-disciplinary discharge planning process, led by the attending physician.  Recommendations may be updated based on patient status, additional functional criteria and insurance authorization.  Follow Up Recommendations  Outpatient PT     Assistance Recommended at Discharge PRN  Patient can return home with the following Help with stairs or ramp for entrance;Assist for transportation   Equipment Recommendations  None recommended by PT    Recommendations for Other Services       Precautions / Restrictions Precautions Precautions: Fall Restrictions Weight Bearing Restrictions: No     Mobility  Bed Mobility Overal bed mobility: Modified Independent                  Transfers Overall transfer level: Needs assistance Equipment used: Rolling walker (2 wheels) Transfers: Sit to/from Stand Sit to Stand: Supervision           General transfer comment: educated patient to monitor for sings of increasing dizziness and to avoid  standing/ambulating if worsening. dizziness appears constant with no real increase with mobility transitions today    Ambulation/Gait Ambulation/Gait assistance: Min guard, Supervision Gait Distance (Feet): 200 Feet Assistive device: Rolling walker (2 wheels) Gait Pattern/deviations: Step-through pattern Gait velocity: decreased     General Gait Details: slow but steady cadence with no reported increased dizziness today with walking. encouraged patient to use gaze stabiliation stratgies if needed and to continue using rolling walker for safety and fall prevention   Stairs             Wheelchair Mobility    Modified Rankin (Stroke Patients Only)       Balance Overall balance assessment: Needs assistance Sitting-balance support: Feet supported Sitting balance-Leahy Scale: Good     Standing balance support: Bilateral upper extremity supported, Reliant on assistive device for balance Standing balance-Leahy Scale: Fair Standing balance comment: no external support is provided from therapist                            Cognition Arousal/Alertness: Awake/alert Behavior During Therapy: WFL for tasks assessed/performed Overall Cognitive Status: Within Functional Limits for tasks assessed                                          Exercises      General Comments        Pertinent Vitals/Pain Pain Assessment Pain Assessment: No/denies pain    Home Living  Prior Function            PT Goals (current goals can now be found in the care plan section) Acute Rehab PT Goals Patient Stated Goal: to go home PT Goal Formulation: With patient Time For Goal Achievement: 08/22/22 Potential to Achieve Goals: Good Progress towards PT goals: Progressing toward goals    Frequency    Min 2X/week      PT Plan Current plan remains appropriate    Co-evaluation              AM-PAC PT "6 Clicks"  Mobility   Outcome Measure  Help needed turning from your back to your side while in a flat bed without using bedrails?: None Help needed moving from lying on your back to sitting on the side of a flat bed without using bedrails?: None Help needed moving to and from a bed to a chair (including a wheelchair)?: A Little Help needed standing up from a chair using your arms (e.g., wheelchair or bedside chair)?: A Little Help needed to walk in hospital room?: A Little Help needed climbing 3-5 steps with a railing? : A Little 6 Click Score: 20    End of Session   Activity Tolerance: Patient tolerated treatment well Patient left: in bed;with call bell/phone within reach Nurse Communication: Mobility status (discussed with MD) PT Visit Diagnosis: Unsteadiness on feet (R26.81);Difficulty in walking, not elsewhere classified (R26.2)     Time: 9355-2174 PT Time Calculation (min) (ACUTE ONLY): 18 min  Charges:  $Therapeutic Activity: 8-22 mins                    Minna Merritts, PT, MPT    Percell Locus 08/09/2022, 10:06 AM

## 2022-08-09 NOTE — Discharge Summary (Signed)
[facility] Discharge Summary   Patient: Scott Spence DGL:[id number]  PCP: [place] Pink, MD  Date of admission: 08/07/2022   Date of discharge:  08/09/2022     Discharge Diagnoses:   Principal Problem:   Vertigo, peripheral   Admitted From: Home Disposition:  Home with outpatient PT  Recommendations for Outpatient Follow-up:  Follow-up with PCP in 1 week, continue to monitor BP at home and follow with PCP to titrate medications accordingly. Follow-up with neurology in 1 to 2 weeks Follow-up with cardiology for event monitor for 30 days as an outpatient. F/u Dr Neoma Laming, cardiologist. Office Address: [place], [place] [address] Follow up LABS/TEST:  as above   Diet recommendation: Cardiac diet  Activity: The patient is advised to gradually reintroduce usual activities, as tolerated  Discharge Condition: stable  Code Status: Full code   History of present illness: As per the H and P dictated on admission, Hospital Course:  avid Kakos is a 72 y.o. male with medical history significant of  BPH, CKDIII, GERD, Gout, Hypertension who presents to ED with 24 hours of severe vertigo associated with gait disturbance due to feeling  of balanced. Patient notes no history of uri sxs, fever/chills/ cough / body aches /diarrhea/ dysuria/blood in stool or urine. He notes symptoms worse with movement. He describes his dizziness as being of balance even while in bed. He notes that he does not feel like the room is spinning. He notes associated, vision loss or blurry vision but does when looking to the left or right feels his vision is not as focused.He notes no paresthesias , or feeling of presyncope. ED Course:  Afeb, bp 195/120, hr 73, rr 18 , sat 98% on ra a  ZYS:AYTK , lvh  Wbc: 6.9, hgb 17.1, plt 160, Na 135, K 3.9, glu 126 ,c r 0.99 Etoh <10, Inr 1.1, CTH: NAD. MRI BRAIN:NEG CTA: 1. 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 2. Severe right and mild left  vertebral artery origin stenosis. Right vertebral artery is non dominant. 3. 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 Discussed with Dr Parke Simmers on call neurology. Preliminary Rec 300 plavix, followed by '75mg'$   daily and Tx meclizine   Assessment and Plan: # Vertigo, most likely due to suspected brainstem stroke\ MRI No evidence of acute intracranial abnormality. Moderate chronic small-vessel ischemic changes within the cerebral white matter. To a lesser extent, chronic small vessel ischemic changes are also present within the bilateral basal ganglia. CTA with mod to severe disease in posterior circulation although no emergent large vessel occlusion or proximal hemodynamically significant stenosis -per neurology due to concern for possible central vertigo,loaded with '300mg'$  plavix  followed by '75mg'$  po daily and admit TIA protocol. Recommend Dix-Hallpike / Eppley given significant positional description to evaluate for BPPV. Also HINTS exam (if test of skew positive or direction changing nystagmus assume brainstem negative stroke and admit for stroke/TIA workup) patient was also treated with meclizine which did not help and discontinued.  Neurology recommended aspirin 81 mg and Plavix 75 mg p.o. daily for 21 days followed by aspirin 81 mg lifelong. lipid panel LDL 70 at goal, continued Crestor 5 mg p.o. daily.  Orthostatic vital signs were negative.  Patient was seen by PT and OT, recommended outpatient PT.  TTE shows LVEF 50 to 45%, no regional wall motion abnormality, severe concentric LV hypertrophy consistent with grade 1 diastolic dysfunction.  Left atrium moderately dilated.  Neurology recommended cardiac monitor as an outpatient, cardiology  was consulted and patient was provided with office address to follow for Zio patch to rule out occult arrhythmia.  Patient was cleared for discharge, agreed with the discharge planning. 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 # Essential HTN, BP was fluctuating.  Antihypertensive medications were held to allow permissive hypertension, resumed losartan at home dose on discharge.  Patient was advised to monitor BP and follow with PCP to titrate medications accordingly. # GERD, continue omeprazole.   Body mass index is 27.42 kg/m.  Interventions:  Patient was seen by physical therapy, who recommended outpatient PT, which was arranged. On the day of the discharge the patient's vitals were stable, and no other acute medical condition were reported by patient. the patient was felt safe to be discharge at Home with outpatient PT.  Consultants: Neurology Procedures: None  Discharge Exam: General: Appear in no distress, no Rash; Oral Mucosa Clear, moist. Cardiovascular: S1 and S2 Present, no Murmur, Respiratory: normal respiratory effort, Bilateral Air entry present and no Crackles, no wheezes Abdomen: Bowel Sound present, Soft and no tenderness, no hernia Extremities: no Pedal edema, no calf tenderness Neurology: alert and oriented to time, place, and person affect appropriate.  Filed Weights   08/07/22 1402  Weight: 79.4 kg   Vitals:   08/09/22 0544 08/09/22 0730  BP: (!) 125/97 (!) 157/89  Pulse: 82 84  Resp: 16 18  Temp: (!) 97.5 F (36.4 C) 97.7 F (36.5 C)  SpO2: 94% 97%    DISCHARGE MEDICATION: Allergies as of 08/09/2022       Reactions   Meperidine Other (See Comments)   GI Upset   Sulfa Antibiotics Hives   Gabapentin Other (See Comments)   Migraine HA   Lisinopril Cough        Medication List     STOP taking these medications    calcium gluconate 500 MG tablet   Coenzyme Q-10 200 MG Caps   GLUCOSAMINE 1500 COMPLEX PO   Magnesium 250 MG Tabs   mometasone 50 MCG/ACT nasal spray Commonly known as: NASONEX   multivitamin capsule   Restasis 0.05 % ophthalmic emulsion Generic drug: cycloSPORINE   Vitamin D2 50 MCG (2000 UT)  Tabs       TAKE these medications    allopurinol 300 MG tablet Commonly known as: ZYLOPRIM Take 300 mg by mouth daily.   aspirin EC 81 MG tablet Take 1 tablet (81 mg total) by mouth daily. What changed: when to take this   clopidogrel 75 MG tablet Commonly known as: PLAVIX Take 1 tablet (75 mg total) by mouth daily for 19 days. Start taking on: August 10, 2022   losartan 100 MG tablet Commonly known as: COZAAR Take 100 mg by mouth daily.   omeprazole 20 MG capsule Commonly known as: PRILOSEC Take 20 mg by mouth daily.   rosuvastatin 5 MG tablet Commonly known as: CRESTOR Take 5 mg by mouth daily.       Allergies  Allergen Reactions   Meperidine Other (See Comments)    GI Upset   Sulfa Antibiotics Hives   Gabapentin Other (See Comments)    Migraine HA   Lisinopril Cough   Discharge Instructions     Call MD for:   Complete by: As directed    Worsening of headache or dizziness, any new neurological changes, any weakness or numbness, any problems with swallowing or speech.   Call MD for:  difficulty breathing,  headache or visual disturbances   Complete by: As directed    Call MD for:  extreme fatigue   Complete by: As directed    Call MD for:  persistant dizziness or light-headedness   Complete by: As directed    Call MD for:  severe uncontrolled pain   Complete by: As directed    Diet - low sodium heart healthy   Complete by: As directed    Discharge instructions   Complete by: As directed    Follow-up with PCP in 1 week, continue to monitor BP at home and follow with PCP to titrate medications accordingly. Follow-up with neurology in 1 to 2 weeks Follow-up with cardiology for event monitor for 30 days as an outpatient. F/u Dr Neoma Laming, cardiologist. Office Address: [address], [place] [address]   Increase activity slowly   Complete by: As directed        The results of significant diagnostics from this hospitalization (including imaging,  microbiology, ancillary and laboratory) are listed below for reference.    Significant Diagnostic Studies: ECHOCARDIOGRAM COMPLETE BUBBLE STUDY  Result Date: 08/09/2022    ECHOCARDIOGRAM REPORT   Patient Name:   Tanya Riddell Date of Exam: 08/08/2022 Medical Rec #:  [id number]    Height:       67.0 in Accession #:    [id number]   Weight:       175.0 lb Date of Birth:  February 24, 1951     BSA:          1.911 m Patient Age:    68 years     BP:           156/95 mmHg Patient Gender: M            HR:           75 bpm. Exam Location:  [facility] Procedure: 2D Echo, Cardiac Doppler, Color Doppler and Saline Contrast Bubble            Study Indications:     TIA  History:         Patient has no prior history of Echocardiogram examinations.                  Risk Factors:Hypertension.  Sonographer:     Wenda Low Referring Phys:  [id number] SARA-MAIZ A THOMAS Diagnosing Phys: [place]  1. Left ventricular ejection fraction, by estimation, is 50 to 55%. The left ventricle has low normal function. The left ventricle has no regional wall motion abnormalities. There is severe concentric left ventricular hypertrophy. Left ventricular diastolic parameters are consistent with Grade I diastolic dysfunction (impaired relaxation).  2. Right ventricular systolic function is normal. The right ventricular size is normal.  3. Left atrial size was moderately dilated.  4. Right atrial size was mildly dilated.  5. The mitral valve is normal in structure. Trivial mitral valve regurgitation. No evidence of mitral stenosis.  6. The aortic valve is normal in structure. Aortic valve regurgitation is not visualized. No aortic stenosis is present.  7. The inferior vena cava is normal in size with greater than 50% respiratory variability, suggesting right atrial pressure of 3 mmHg. FINDINGS  Left Ventricle: Left ventricular ejection fraction, by estimation, is 50 to 55%. The left ventricle has low normal function. The left ventricle has no  regional wall motion abnormalities. The left ventricular internal cavity size was normal in size. There is severe concentric left ventricular hypertrophy. Left ventricular diastolic parameters are consistent with Grade  I diastolic dysfunction (impaired relaxation). Right Ventricle: The right ventricular size is normal. No increase in right ventricular wall thickness. Right ventricular systolic function is normal. Left Atrium: Left atrial size was moderately dilated. Right Atrium: Right atrial size was mildly dilated. Pericardium: There is no evidence of pericardial effusion. Mitral Valve: The mitral valve is normal in structure. Trivial mitral valve regurgitation. No evidence of mitral valve stenosis. MV peak gradient, 3.7 mmHg. The mean mitral valve gradient is 1.0 mmHg. Tricuspid Valve: The tricuspid valve is normal in structure. Tricuspid valve regurgitation is mild . No evidence of tricuspid stenosis. Aortic Valve: The aortic valve is normal in structure. Aortic valve regurgitation is not visualized. No aortic stenosis is present. Aortic valve mean gradient measures 4.0 mmHg. Aortic valve peak gradient measures 7.2 mmHg. Aortic valve area, by VTI measures 2.42 cm. Pulmonic Valve: The pulmonic valve was normal in structure. Pulmonic valve regurgitation is not visualized. No evidence of pulmonic stenosis. Aorta: The aortic root is normal in size and structure. Venous: The inferior vena cava is normal in size with greater than 50% respiratory variability, suggesting right atrial pressure of 3 mmHg. IAS/Shunts: No atrial level shunt detected by color flow Doppler. Agitated saline contrast was given intravenously to evaluate for intracardiac shunting.  LEFT VENTRICLE PLAX 2D LVIDd:         4.00 cm   Diastology LVIDs:         3.00 cm   LV e' medial:    5.98 cm/s LV PW:         1.10 cm   LV E/e' medial:  7.2 LV IVS:        1.50 cm   LV e' lateral:   9.90 cm/s LVOT diam:     2.10 cm   LV E/e' lateral: 4.4 LV SV:          65 LV SV Index:   34 LVOT Area:     3.46 cm  RIGHT VENTRICLE RV Basal diam:  3.25 cm RV Mid diam:    2.60 cm RV S prime:     22.20 cm/s TAPSE (M-mode): 2.4 cm LEFT ATRIUM             Index        RIGHT ATRIUM           Index LA diam:        3.90 cm 2.04 cm/m   RA Area:     15.20 cm LA Vol (A2C):   54.8 ml 28.68 ml/m  RA Volume:   34.90 ml  18.26 ml/m LA Vol (A4C):   41.2 ml 21.56 ml/m LA Biplane Vol: 48.1 ml 25.17 ml/m  AORTIC VALVE                    PULMONIC VALVE AV Area (Vmax):    2.40 cm     PV Vmax:       1.60 m/s AV Area (Vmean):   2.36 cm     PV Peak grad:  10.2 mmHg AV Area (VTI):     2.42 cm AV Vmax:           134.00 cm/s AV Vmean:          85.800 cm/s AV VTI:            0.270 m AV Peak Grad:      7.2 mmHg AV Mean Grad:      4.0 mmHg LVOT Vmax:  92.70 cm/s LVOT Vmean:        58.500 cm/s LVOT VTI:          0.189 m LVOT/AV VTI ratio: 0.70  AORTA Ao Root diam: 3.20 cm MITRAL VALVE MV Area (PHT): 2.49 cm    SHUNTS MV Area VTI:   3.16 cm    Systemic VTI:  0.19 m MV Peak grad:  3.7 mmHg    Systemic Diam: 2.10 cm MV Mean grad:  1.0 mmHg MV Vmax:       0.97 m/s MV Vmean:      44.1 cm/s MV Decel Time: 305 msec MV E velocity: 43.10 cm/s MV A velocity: 87.40 cm/s MV E/A ratio:  0.49 Shaukat Khan Electronically signed by Neoma Laming Signature Date/Time: 08/09/2022/11:48:21 AM    Final    CT ANGIO HEAD NECK W WO CM  Result Date: 08/07/2022 CLINICAL DATA:  Stroke, follow up EXAM: CT ANGIOGRAPHY HEAD AND NECK TECHNIQUE: Multidetector CT imaging of the head and neck was performed using the standard protocol during bolus administration of intravenous contrast. Multiplanar CT image reconstructions and MIPs were obtained to evaluate the vascular anatomy. Carotid stenosis measurements (when applicable) are obtained utilizing NASCET criteria, using the distal internal carotid diameter as the denominator. RADIATION DOSE REDUCTION: This exam was performed according to the departmental dose-optimization  program which includes automated exposure control, adjustment of the mA and/or kV according to patient size and/or use of iterative reconstruction technique. CONTRAST:  66m OMNIPAQUE IOHEXOL 350 MG/ML SOLN COMPARISON:  MRI and CT head from the same day. FINDINGS: CTA NECK FINDINGS Aortic arch: Great vessel origins are patent. 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 Right carotid system: Patent. Atherosclerosis at the carotid bifurcation involving the proximal ICA with resulting approximately 70% stenosis of the proximal ICA. Left carotid system: Atherosclerosis at the carotid bifurcation and involving the proximal ICA without greater than 50% stenosis. Vertebral arteries: Left dominant. Patent bilaterally. 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 Skeleton: No acute findings on limited assessment. Multilevel degenerative change. Other neck: No acute findings on limited assessment.w Upper chest: Visualized lung apices are clear. Review of the MIP images confirms the above findings CTA HEAD FINDINGS Anterior circulation: Bilateral intracranial ICAs are patent with mild atherosclerotic narrowing. Hypoplastic right A1 ACA, anatomic variant. Otherwise, MCAs and ACAs are patent without proximal hemodynamically significant stenosis. Posterior circulation: Bilateral intradural vertebral arteries, basilar artery and bilateral posterior cerebral arteries are patent without proximal hemodynamically significant stenosis. Right fetal type PCA, anatomic variant. Venous sinuses: As permitted by contrast timing, patent. Review of the MIP images confirms the above findings IMPRESSION: CTA head: No emergent large vessel occlusion or proximal hemodynamically significant stenosis. CTA neck: 1. 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 2. 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 Right vertebral artery is non dominant. 3. 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  Electronically Signed   By: FMargaretha SheffieldM.D.   On: 08/07/2022 18:55   MR BRAIN WO CONTRAST  Result Date: 08/07/2022 CLINICAL DATA:  Provided history: Neuro deficit, acute, stroke suspected. Additional history provided: Dizziness, loss of balance, elevated blood pressure. EXAM: MRI HEAD WITHOUT CONTRAST TECHNIQUE: Multiplanar, multiecho pulse sequences of the brain and surrounding structures were obtained without intravenous contrast. COMPARISON:  Head CT 08/07/2022. FINDINGS: Brain: No age advanced or lobar predominant parenchymal atrophy. Moderate multifocal T2 FLAIR hyperintense signal abnormality within the cerebral white matter, nonspecific but compatible with chronic small vessel disease. To a  lesser extent, chronic small vessel ischemic changes are also present within the bilateral basal ganglia. There is no acute infarct. No evidence of an intracranial mass. No extra-axial fluid collection. No midline shift. Vascular: Maintained flow voids within the proximal large arterial vessels. Skull and upper cervical spine: Focal suspicious marrow lesion. Incompletely assessed cervical spondylosis. Sinuses/Orbits: No mass or acute finding within the imaged orbits. Trace mucosal thickening within the right maxillary sinus. IMPRESSION: No evidence of acute intracranial abnormality. Moderate chronic small-vessel ischemic changes within the cerebral white matter. To a lesser extent, chronic small vessel ischemic changes are also present within the bilateral basal ganglia. Electronically Signed   By: Kellie Simmering D.O.   On: 08/07/2022 16:40   CT HEAD WO CONTRAST  Result Date: 08/07/2022 CLINICAL DATA:  Dizziness, loss of balance, hypertension EXAM: CT HEAD WITHOUT CONTRAST TECHNIQUE: Contiguous axial images were obtained from the base of the skull through the vertex without intravenous contrast. RADIATION DOSE REDUCTION: This exam was performed according to the departmental dose-optimization program which  includes automated exposure control, adjustment of the mA and/or kV according to patient size and/or use of iterative reconstruction technique. COMPARISON:  None Available. FINDINGS: Brain: Scattered hypodensities within the basal ganglia and frontal periventricular white matter consistent with chronic small vessel ischemic changes. No signs of acute infarct or hemorrhage. Lateral ventricles and remaining midline structures are unremarkable. No acute extra-axial fluid collections. No mass effect. Vascular: No hyperdense vessel or unexpected calcification. Skull: Normal. Negative for fracture or focal lesion. Sinuses/Orbits: No acute finding. Other: None. IMPRESSION: 1. Scattered hypodensities within the frontal periventricular white matter and bilateral basal ganglia, most consistent with chronic small vessel ischemic change. 2. No acute intracranial process. Electronically Signed   By: Randa Ngo M.D.   On: 08/07/2022 15:21    Microbiology: Recent Results (from the past 240 hour(s))  Resp panel by RT-PCR (RSV, Flu A&B, Covid) Anterior Nasal Swab     Status: None   Collection Time: 08/07/22  9:15 PM   Specimen: Anterior Nasal Swab  Result Value Ref Range Status   SARS Coronavirus 2 by RT PCR NEGATIVE NEGATIVE Final    Comment: (NOTE) SARS-CoV-2 target nucleic acids are NOT DETECTED.  The SARS-CoV-2 RNA is generally detectable in upper respiratory specimens during the acute phase of infection. The lowest concentration of SARS-CoV-2 viral copies this assay can detect is 138 copies/mL. A negative result does not preclude SARS-Cov-2 infection and should not be used as the sole basis for treatment or other patient management decisions. A negative result may occur with  improper specimen collection/handling, submission of specimen other than nasopharyngeal swab, presence of viral mutation(s) within the areas targeted by this assay, and inadequate number of viral copies(<138 copies/mL). A negative  result must be combined with clinical observations, patient history, and epidemiological information. The expected result is Negative.  Fact Sheet for Patients:  EntrepreneurPulse.com.au  Fact Sheet for Healthcare Providers:  IncredibleEmployment.be  This test is no t yet approved or cleared by the [place] FDA and  has been authorized for detection and/or diagnosis of SARS-CoV-2 by FDA under an Emergency Use Authorization (EUA). This EUA will remain  in effect (meaning this test can be used) for the duration of the COVID-19 declaration under Section 564(b)(1) of the Act, 21 U.S.C.section 360bbb-3(b)(1), unless the authorization is terminated  or revoked sooner.       Influenza A by PCR NEGATIVE NEGATIVE Final   Influenza B by PCR NEGATIVE NEGATIVE Final  Comment: (NOTE) The Xpert Xpress SARS-CoV-2/FLU/RSV plus assay is intended as an aid in the diagnosis of influenza from Nasopharyngeal swab specimens and should not be used as a sole basis for treatment. Nasal washings and aspirates are unacceptable for Xpert Xpress SARS-CoV-2/FLU/RSV testing.  Fact Sheet for Patients: EntrepreneurPulse.com.au  Fact Sheet for Healthcare Providers: IncredibleEmployment.be  This test is not yet approved or cleared by the [place] FDA and has been authorized for detection and/or diagnosis of SARS-CoV-2 by FDA under an Emergency Use Authorization (EUA). This EUA will remain in effect (meaning this test can be used) for the duration of the COVID-19 declaration under Section 564(b)(1) of the Act, 21 U.S.C. section 360bbb-3(b)(1), unless the authorization is terminated or revoked.     Resp Syncytial Virus by PCR NEGATIVE NEGATIVE Final    Comment: (NOTE) Fact Sheet for Patients: EntrepreneurPulse.com.au  Fact Sheet for Healthcare Providers: IncredibleEmployment.be  This  test is not yet approved or cleared by the [place] FDA and has been authorized for detection and/or diagnosis of SARS-CoV-2 by FDA under an Emergency Use Authorization (EUA). This EUA will remain in effect (meaning this test can be used) for the duration of the COVID-19 declaration under Section 564(b)(1) of the Act, 21 U.S.C. section 360bbb-3(b)(1), unless the authorization is terminated or revoked.  Performed at [facility], [address] [place], [place] [address]      Labs: CBC: Recent Labs  Lab 08/07/22 1407 08/08/22 0829 08/09/22 0336  WBC 6.9 5.8 6.4  NEUTROABS 4.5  --   --   HGB 17.1* 15.3 15.6  HCT 50.1 45.5 45.2  MCV 93.5 94.2 93.2  PLT 160 141* 570   Basic Metabolic Panel: Recent Labs  Lab 08/07/22 1407 08/08/22 0829 08/09/22 0336  NA 135 138 137  K 3.9 3.9 3.4*  CL 101 106 105  CO2 25 22 20*  GLUCOSE 126* 110* 101*  BUN '18 15 19  '$ CREATININE 0.99 1.03 0.94  CALCIUM 9.7 8.8* 9.2  MG  --  2.0 2.1  PHOS  --  3.7 2.9   Liver Function Tests: Recent Labs  Lab 08/07/22 1407  AST 36  ALT 37  ALKPHOS 100  BILITOT 1.3*  PROT 7.8  ALBUMIN 4.7   No results for input(s): "LIPASE", "AMYLASE" in the last 168 hours. No results for input(s): "AMMONIA" in the last 168 hours. Cardiac Enzymes: No results for input(s): "CKTOTAL", "CKMB", "CKMBINDEX", "TROPONINI" in the last 168 hours. BNP (last 3 results) No results for input(s): "BNP" in the last 8760 hours. CBG: No results for input(s): "GLUCAP" in the last 168 hours.  Time spent: 35 minutes  Signed:  Val Riles  [facility]  08/09/2022 12:32 PM

## 2022-08-19 NOTE — Progress Notes (Unsigned)
Cardiology Office Note:   Date:  08/22/2022  NAME:  Scott Spence    MRN: [id number] DOB:  19-Dec-1950   PCP:  Maryland Pink, MD  Cardiologist:  Evalina Field, MD  Electrophysiologist:  None   Referring MD: Maryland Pink, MD   Chief Complaint  Patient presents with   Cerebrovascular Accident    History of Present Illness:   Scott Spence is a 72 y.o. male with a hx of HTN, HLD, CVA who is being seen today for the evaluation of stroke at the request of Maryland Pink, MD. recently admitted to the hospital on 08/07/2022 with what was thought to be a brainstem stroke.  He did have evidence of small vessel ischemic changes in the basal ganglia.  These appear to be lacunar infarcts.  He was noted to have a left right internal carotid artery stenosis.  This was not addressed.  I did review the images.  I am quite concerned.  His blood pressure has been elevated.  BP 148/92 today.  EKG demonstrates sinus rhythm.  Echocardiogram in the hospital just showed changes of LVH which are likely blood pressure related.  He has never had a heart attack or stroke before.  His cholesterol could be a bit lower.  We discussed increasing his Crestor.  He needs more blood pressure medication.  He is still quite dizzy.  He is on aspirin and Plavix.  He is working with neurology and physical therapy.  He is still restricted from driving.  I do agree with this.  Neurology will clear him.  Regarding cardioembolic source I am not concerned about this.  He has evidence of lacunar infarcts.  He also has a right internal carotid artery stenosis.  I believe this could be contributing.  I would like for him to see vascular surgery before we proceed with monitor.  I think a monitor may be unnecessary.  He does not smoke.  No alcohol currently.  No drug use.  He is a retired Tree surgeon.  He has several children and grandchildren.  He is in physical therapy.  CV examination is normal.  Not diabetic.  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 Carotid artery disease  -70% R ICA 3. HLD -T chol 151, HDL 50, LDL 70, TG 156 4. HTN  Past Medical History: Past Medical History:  Diagnosis Date   BPH with urinary obstruction    Chronic kidney disease    hemorrhagic nephritis as a child   Complication of anesthesia    collapsed lung with local for LUE surgery   GERD (gastroesophageal reflux disease)    Gout    History of kidney stones    Hypertension    Stroke [facility])     Past Surgical History: Past Surgical History:  Procedure Laterality Date   APPENDECTOMY     FOOT ARTHRODESIS Right 03/29/2022   Procedure: SUBTALAR ARTHRODESIS;  Surgeon: Wylene Simmer, MD;  Location: [place];  Service: Orthopedics;  Laterality: Right;   HERNIA REPAIR     HOLEP-LASER ENUCLEATION OF THE PROSTATE WITH MORCELLATION N/A 03/09/2022   Procedure: HOLEP-LASER ENUCLEATION OF THE PROSTATE WITH MORCELLATION;  Surgeon: Billey Co, MD;  Location: [facility];  Service: Urology;  Laterality: N/A;   KIDNEY STONE SURGERY     PROSTATECTOMY     TOTAL HIP ARTHROPLASTY Right 04/01/2018   Procedure: TOTAL HIP ARTHROPLASTY ANTERIOR APPROACH;  Surgeon: Hessie Knows, MD;  Location: [facility];  Service: Orthopedics;  Laterality: Right;    Current Medications: Current Meds  Medication Sig   allopurinol (ZYLOPRIM) 300 MG tablet Take 300 mg by mouth daily.    amLODipine (NORVASC) 5 MG tablet Take 1 tablet (5 mg total) by mouth daily.   aspirin EC 81 MG tablet Take 1 tablet (81 mg total) by mouth daily.   clopidogrel (PLAVIX) 75 MG tablet Take 1 tablet (75 mg total) by mouth daily for 19 days.   losartan (COZAAR) 100 MG tablet Take 100 mg by mouth daily.    omeprazole (PRILOSEC) 20 MG capsule Take 20 mg by mouth daily.    rosuvastatin (CRESTOR) 10 MG tablet Take 1 tablet (10 mg total) by mouth daily.   [DISCONTINUED] rosuvastatin (CRESTOR) 5 MG tablet Take 5 mg by  mouth daily.     Allergies:    Meperidine, Sulfa antibiotics, Gabapentin, and Lisinopril   Social History: Social History   Socioeconomic History   Marital status: Married    Spouse name: Not on file   Number of children: 2   Years of education: Not on file   Highest education level: Not on file  Occupational History   Occupation: Retired - Tree surgeon [employer]  Tobacco Use   Smoking status: Never    Passive exposure: Never   Smokeless tobacco: Never  Vaping Use   Vaping Use: Never used  Substance and Sexual Activity   Alcohol use: Yes    Alcohol/week: 14.0 standard drinks of alcohol    Types: 14 Glasses of wine per week    Comment: 3-4 glasses a day   Drug use: No   Sexual activity: Yes    Birth control/protection: None  Other Topics Concern   Not on file  Social History Narrative   Not on file   Social Determinants of Health   Financial Resource Strain: Not on file  Food Insecurity: No Food Insecurity (08/07/2022)   Hunger Vital Sign    Worried About Running Out of Food in the Last Year: Never true    Ran Out of Food in the Last Year: Never true  Transportation Needs: No Transportation Needs (08/07/2022)   PRAPARE - Hydrologist (Medical): No    Lack of Transportation (Non-Medical): No  Physical Activity: Not on file  Stress: Not on file  Social Connections: Not on file     Family History: The patient's family history includes Heart attack in his father. There is no history of Prostate cancer, Bladder Cancer, or Kidney cancer.  ROS:   All other ROS reviewed and negative. Pertinent positives noted in the HPI.     EKGs/Labs/Other Studies Reviewed:   The following studies were personally reviewed by me today:  EKG:  EKG is ordered today.  The ekg ordered today demonstrates normal sinus rhythm heart rate 85, no acute ischemic changes or evidence of infarction, and was personally reviewed by me.   TTE 08/09/2022  1. Left  ventricular ejection fraction, by estimation, is 50 to 55%. The  left ventricle has low normal function. The left ventricle has no regional  wall motion abnormalities. There is severe concentric left ventricular  hypertrophy. Left ventricular  diastolic parameters are consistent with Grade I diastolic dysfunction  (impaired relaxation).   2. Right ventricular systolic function is normal. The right ventricular  size is normal.   3. Left atrial size was moderately dilated.   4. Right atrial size was mildly dilated.   5. The mitral  valve is normal in structure. Trivial mitral valve  regurgitation. No evidence of mitral stenosis.   6. The aortic valve is normal in structure. Aortic valve regurgitation is  not visualized. No aortic stenosis is present.   7. The inferior vena cava is normal in size with greater than 50%  respiratory variability, suggesting right atrial pressure of 3 mmHg.   CTA Head/Neck 08/07/2022 1. 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 2. 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 Right vertebral artery is non dominant. 3. 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  Recent Labs: 08/07/2022: ALT 37 08/09/2022: BUN 19; Creatinine, Ser 0.94; Hemoglobin 15.6; Magnesium 2.1; Platelets 153; Potassium 3.4; Sodium 137   Recent Lipid Panel    Component Value Date/Time   CHOL 151 08/08/2022 0443   TRIG 156 (H) 08/08/2022 0443   HDL 50 08/08/2022 0443   CHOLHDL 3.0 08/08/2022 0443   VLDL 31 08/08/2022 0443   LDLCALC 70 08/08/2022 0443    Physical Exam:   VS:  BP (!) 148/92   Pulse 85   Ht '5\' 7"'$  (1.702 m)   Wt 190 lb 9.6 oz (86.5 kg)   BMI 29.85 kg/m    Wt Readings from Last 3 Encounters:  08/22/22 190 lb 9.6 oz (86.5 kg)  08/07/22 175 lb 0.7 oz (79.4 kg)  06/07/22 175 lb (79.4 kg)    General: Well nourished, well developed, in no acute distress Head: Atraumatic, normal size  Eyes: PEERLA, EOMI  Neck: Supple, no JVD, right  carotid bruit Endocrine: No thryomegaly Cardiac: Normal S1, S2; RRR; no murmurs, rubs, or gallops Lungs: Clear to auscultation bilaterally, no wheezing, rhonchi or rales  Abd: Soft, nontender, no hepatomegaly  Ext: No edema, pulses 2+ Musculoskeletal: No deformities, BUE and BLE strength normal and equal Skin: Warm and dry, no rashes   Neuro: Alert and oriented to person, place, time, and situation, CNII-XII grossly intact, no focal deficits  Psych: Normal mood and affect   ASSESSMENT:   Abas Leicht is a 72 y.o. male who presents for the following: 1. History of stroke   2. Mixed hyperlipidemia   3. Stenosis of right carotid artery   4. Primary hypertension     PLAN:   1. History of stroke 2. Mixed hyperlipidemia 3. Stenosis of right carotid artery -Recently admitted with dizziness and brainstem stroke.  Also had evidence of lacunar infarcts.  His lacunar infarct or small vessel disease related to uncontrolled hypertension.  We will add amlodipine 5 mg to his regimen in addition to losartan 100 mg daily.  His blood pressure should be less than 130/80.  He will follow-up with his primary care physician next week for further management of this.  He should also have his LDL cholesterol less than 55.  Increase Crestor to 10 mg daily. -Regarding etiology of stroke I am a bit concerned about the right internal carotid artery.  This is 70% stenosed and likely worse based on my review.  I discussed his case with vascular surgery.  They will get him in to be seen in the next few weeks.  He may need intervention upon this. -To me he does not need a monitor.  I believe the right internal carotid artery could explain his stroke.  His echo showed normal LV function and just LVH.  No significant valvular heart disease.  Will have him see vascular surgery before we do any other testing. -He will follow-up with neurology for precautions as  well as further management of his neurological deficits.  4.  Primary hypertension -Continue losartan 100 mg daily.  Add amlodipine 5 mg daily.  BP goal less than 130/80.  Disposition: Return in about 1 year (around 08/23/2023).  Medication Adjustments/Labs and Tests Ordered: Current medicines are reviewed at length with the patient today.  Concerns regarding medicines are outlined above.  Orders Placed This Encounter  Procedures   Ambulatory referral to Vascular Surgery   EKG 12-Lead   Meds ordered this encounter  Medications   amLODipine (NORVASC) 5 MG tablet    Sig: Take 1 tablet (5 mg total) by mouth daily.    Dispense:  90 tablet    Refill:  3   rosuvastatin (CRESTOR) 10 MG tablet    Sig: Take 1 tablet (10 mg total) by mouth daily.    Dispense:  90 tablet    Refill:  3    Patient Instructions  Medication Instructions:  Your physician has recommended you make the following change in your medication:  START: Amlodipine '5mg'$  daily INCREASE: Rosuvastatin '10mg'$  daily.  *If you need a refill on your cardiac medications before your next appointment, please call your pharmacy*   Lab Work: NONE If you have labs (blood work) drawn today and your tests are completely normal, you will receive your results only by: [place] (if you have MyChart) OR A paper copy in the mail If you have any lab test that is abnormal or we need to change your treatment, we will call you to review the results.   Testing/Procedures: NONE   Follow-Up: At [facility], you and your health needs are our priority.  As part of our continuing mission to provide you with exceptional heart care, we have created designated Provider Care Teams.  These Care Teams include your primary Cardiologist (physician) and Advanced Practice Providers (APPs -  Physician Assistants and Nurse Practitioners) who all work together to provide you with the care you need, when you need it.  We recommend signing up for the patient portal called "MyChart".  Sign up information  is provided on this After Visit Summary.  MyChart is used to connect with patients for Virtual Visits (Telemedicine).  Patients are able to view lab/test results, encounter notes, upcoming appointments, etc.  Non-urgent messages can be sent to your provider as well.   To learn more about what you can do with MyChart, go to [email or web address].    Your next appointment:   1 year(s)  Provider:   Evalina Field, MD     Other Instructions Referring to Monica Martinez, MD at Vascular Surgery.       Signed, Addison Naegeli. Audie Box, MD, [place]  [address] [place], [place] [address] [phone]  08/22/2022 9:32 PM

## 2022-08-20 NOTE — Therapy (Signed)
OUTPATIENT PHYSICAL THERAPY NEURO EVALUATION   Patient Name: Scott Spence MRN: [id number] DOB:1950/08/30, 72 y.o., male Today's Date: 08/22/2022   PCP: Maryland Pink, MD  REFERRING PROVIDER: Maryland Pink, MD   END OF SESSION:  PT End of Session - 08/21/22 1713     Visit Number 1    Number of Visits 16    Date for PT Re-Evaluation 10/16/22    Authorization Time Period Cert through 01/18/35    Progress Note Due on Visit 10    PT Start Time 1605    PT Stop Time 6294    PT Time Calculation (min) 48 min    Equipment Utilized During Treatment Gait belt    Activity Tolerance Patient tolerated treatment well    Behavior During Therapy WFL for tasks assessed/performed             Past Medical History:  Diagnosis Date   BPH with urinary obstruction    Chronic kidney disease    hemorrhagic nephritis as a child   Complication of anesthesia    collapsed lung with local for LUE surgery   GERD (gastroesophageal reflux disease)    Gout    History of kidney stones    Hypertension    Past Surgical History:  Procedure Laterality Date   APPENDECTOMY     FOOT ARTHRODESIS Right 03/29/2022   Procedure: SUBTALAR ARTHRODESIS;  Surgeon: Wylene Simmer, MD;  Location: [place];  Service: Orthopedics;  Laterality: Right;   HERNIA REPAIR     HOLEP-LASER ENUCLEATION OF THE PROSTATE WITH MORCELLATION N/A 03/09/2022   Procedure: HOLEP-LASER ENUCLEATION OF THE PROSTATE WITH MORCELLATION;  Surgeon: Billey Co, MD;  Location: [facility];  Service: Urology;  Laterality: N/A;   KIDNEY STONE SURGERY     PROSTATECTOMY     TOTAL HIP ARTHROPLASTY Right 04/01/2018   Procedure: TOTAL HIP ARTHROPLASTY ANTERIOR APPROACH;  Surgeon: Hessie Knows, MD;  Location: [facility];  Service: Orthopedics;  Laterality: Right;   Patient Active Problem List   Diagnosis Date Noted   Vertigo, peripheral 08/07/2022   Hematuria 05/07/2022   Gross hematuria 05/06/2022   Acute urinary retention  05/06/2022   BPH S/P HoLEP prostatectomy 02/2022 05/06/2022   Status post total hip replacement, right 04/01/2018   Primary osteoarthritis of right hip 01/15/2018   Serrated adenoma of colon 12/30/2014   Anemia, iron deficiency 07/06/2013   Benign prostatic hyperplasia 07/06/2013   Bilateral sensorineural hearing loss 06/29/2010   Tinnitus 06/20/2010   Gouty arthropathy 05/25/2010   Esophageal reflux 12/27/2001   Essential hypertension 12/27/2001    ONSET DATE: 08/07/22  REFERRING DIAG: R26.81 (ICD-10-CM) - Unsteadiness on feet   THERAPY DIAG:  Abnormality of gait and mobility - Plan: PT plan of care cert/re-cert  Difficulty in walking, not elsewhere classified - Plan: PT plan of care cert/re-cert  Muscle weakness (generalized) - Plan: PT plan of care cert/re-cert  Other abnormalities of gait and mobility - Plan: PT plan of care cert/re-cert  Unsteadiness on feet - Plan: PT plan of care cert/re-cert  Rationale for Evaluation and Treatment: Rehabilitation  SUBJECTIVE:  SUBJECTIVE STATEMENT: Pt reports he had a stroke which has caused some balance issues. Prior to stroke pt was getting therapy for his R foot following foot surgery ( foot/ subtalar arthrodesis) and he is still doing exercises for these but he felt his stroke was more important so he has prioritized his balance and PT. Pt reports his balance is still being effected by dizziness. Pt reports his dizziness is worsened by turning his head quickly and rolling over in bed and sitting up from bed. Pt reports he does not feel like the room is spinning but he does feel lightheaded. Pt had orthostatic testing in hospital and there were no positive findings. Pt reports he had scans but did not find anything showing a CVA ut a cardiologist stated he  had the s/s of stroke with testing performed. Pt reports he has had continued high blood pressure with most recent 150/107 this afternoon prior to eval.  Pt accompanied by: self  PERTINENT HISTORY:  Pt reports he had a stroke which has caused some balance issues. Prior to stroke pt was getting therapy for his R foot following foot surgery ( ankle arthrodesis) and he is still doing exercises for these but he felt his stroke was more important so he has prioritized his balance and PT. Pt reports his balance is still being effected by dizziness. Pt reports his dizziness is worsened by turning his head quickly and rolling over in bed and sitting up from bed. Pt reports he does not feel like the room is spinning but he does feel lightheaded. Pt had orthostatic testing in hospital and there were no positive findings. Pt reports he had scans but did not find anything showing a CVA ut a cardiologist stated he had the s/s of stroke with testing performed. Pt reports he has had continued high blood pressure with most recent 150/107 this afternoon prior to eval.   PAIN:  Are you having pain? Yes: NPRS scale: 5/10 Pain location: Right Hip  Pain description: feels musculature ( on the side and back)  Aggravating factors: sitting then going to standing position.  Relieving factors: Ice   PRECAUTIONS: None  WEIGHT BEARING RESTRICTIONS: No  FALLS: Has patient fallen in last 6 months? Yes. Number of falls 1 when having stroke prior to hospitilization  LIVING ENVIRONMENT: Lives with: lives with their family Lives in: House/apartment Stairs: Yes: External: 4 steps; on left going up Has following equipment at home: Single point cane, Walker - 2 wheeled, and bed side commode  PLOF: Independent  PATIENT GOALS: Get balance back, get rid of dizziness and be able to walk up to 3 miles a day   OBJECTIVE:   DIAGNOSTIC FINDINGS: MRI on 1/16: IMPRESSION: No evidence of acute intracranial abnormality.   Moderate  chronic small-vessel ischemic changes within the cerebral white matter. To a lesser extent, chronic small vessel ischemic changes are also present within the bilateral basal ganglia.  CTA head:   No emergent large vessel occlusion or proximal hemodynamically significant stenosis.   CTA neck:   1. 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 2. 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 Right vertebral artery is non dominant. 3. 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   COGNITION: Overall cognitive status: Within functional limits for tasks assessed   SENSATION: Not tested  COORDINATION: WNL with finger to nose      POSTURE: No Significant postural limitations  LOWER EXTREMITY ROM:   WNL   Active  Right Eval Left Eval  Hip flexion    Hip extension    Hip abduction    Hip adduction    Hip internal rotation    Hip external rotation    Knee flexion    Knee extension    Ankle dorsiflexion    Ankle plantarflexion    Ankle inversion    Ankle eversion     (Blank rows = not tested)   LOWER EXTREMITY MMT:    MMT Right Eval Left Eval  Hip flexion * 5  Hip extension    Hip abduction * 5  Hip adduction * 5  Hip internal rotation * 5  Hip external rotation * 5  Knee flexion * 5  Knee extension * 5  Ankle dorsiflexion    Ankle plantarflexion    Ankle inversion    Ankle eversion    (Blank rows = not tested) *= deferred due to insidious onset of hip pain prior to arriving to clinic and pain limiting the hip strength.   BED MOBILITY: Able to perform independently but impaired due to dizziness  Supine to sit dizziness, has to be careful Rolling to Right sometimes this causes dizziness  Rolling to Left Sometimes this causes dizziness   TRANSFERS: Assistive device utilized: None  Sit to stand: Complete Independence Stand to sit: Complete Independence Chair to chair: Complete Independence Floor: Complete  Independence    GAIT: Gait pattern: decreased step length- Left, decreased stance time- Right, and decreased ankle dorsiflexion- Right Distance walked: 50 feet Assistive device utilized: None Level of assistance: Complete Independence Comments: Pt had R foot surgery but PT was interupted secondary to hopsitilization for current problem being treated.   FUNCTIONAL TESTS:  5 times sit to stand: Next session secondary to hip pain  6 minute walk test: Complete future session seocndary to hip pain at initial eval.  Berg Balance Scale: 47    Oculomotor screen: ROM: WNL Spontaneous nystagmus: absent  Convergence/divergence: abnormal convergence, L eye difficulty converging  Smooth pursuits: abnormal, saccadic Saccades: WNL  PATIENT SURVEYS:  FOTO Complete visit 2   TODAY'S TREATMENT:                                                                                                                              DATE: 08/22/22     PATIENT EDUCATION: Education details: POC Person educated: Patient Education method: Explanation Education comprehension: verbalized understanding  HOME EXERCISE PROGRAM: To begin visit 2.   GOALS: Goals reviewed with patient? Yes  SHORT TERM GOALS: Target date: 09/18/2022   Patient will be independent in home exercise program to improve strength/mobility for better functional independence with ADLs. Baseline: No HEP currently  Goal status: INITIAL   LONG TERM GOALS: Target date: 10/16/2022    Patient will improve Berg balance score by 6 points or more indicating improvement in fall risk as well as improvement in balance Baseline: 47 Goal status: INITIAL  2.  Patient will  improve focus on therapeutic outcome survey scale by 10 percentage points or greater to indicate improvement in subjective level of function. Baseline: To assess visit to Goal status: INITIAL  3.  Patient will improve 6-minute walk test to 1250 feet or greater to indicate  root ability to return to walking for distance as he did prior to his hospitalization Baseline: 2 test visit 2, did not test visit 1 secondary to right hip pain Goal status: INITIAL  4.  Patient will improve 5 times sit to stand to 12 seconds or less in order to indicate improved lower extremity muscular strength and power and also decrease risk of falls Baseline: Test visit 2, not tested visit 1 secondary to right hip pain Goal status: INITIAL   ASSESSMENT:  CLINICAL IMPRESSION: Patient is a 72 y.o. male who was seen today for physical therapy evaluation and treatment for impaired balance and mobility following recent hospitalization where patient reports he has had small stroke near his brainstem.  Patient presents with deficits in balance as evidenced by Merrilee Jansky balance scale testing putting him at increased risk of falls.  Patient also demonstrates abnormal convergence and smooth pursuits with vestibular testing.  Several tests were deferred this date secondary to patient having increased right hip pain which was acute onset prior to start of physical therapy session.  Patient also has elevated blood pressure this will need to be continued to be monitored in future sessions.  Patient will benefit from skilled physical therapy interventions in order to improve his balance, improve his mobility, previous endurance, and improve his quality of life.  OBJECTIVE IMPAIRMENTS: Abnormal gait, decreased activity tolerance, decreased balance, decreased endurance, decreased mobility, difficulty walking, impaired vision/preception, and pain.   ACTIVITY LIMITATIONS: squatting, bed mobility, and locomotion level  PARTICIPATION LIMITATIONS: driving and community activity  PERSONAL FACTORS: 1-2 comorbidities: HTN, foot arthrodesis   are also affecting patient's functional outcome.   REHAB POTENTIAL: Good  CLINICAL DECISION MAKING: Evolving/moderate complexity  EVALUATION COMPLEXITY: Moderate  PLAN:  PT  FREQUENCY: 2x/week  PT DURATION: 8 weeks  PLANNED INTERVENTIONS: Therapeutic exercises, Therapeutic activity, Neuromuscular re-education, Balance training, Gait training, Patient/Family education, Self Care, Joint mobilization, Joint manipulation, Stair training, Vestibular training, Canalith repositioning, Visual/preceptual remediation/compensation, Dry Needling, and Manual therapy  PLAN FOR NEXT SESSION: 1: FOTO, vestibular training ( VOR), HEP, begin POC 2: assess R ankle further 3:assess R LE strength dependent on hip pain on R, assess hip pain on R if indicated    Particia Lather, PT 08/22/2022, 8:18 AM

## 2022-08-21 ENCOUNTER — Ambulatory Visit: Payer: Medicare HMO | Attending: Family Medicine | Admitting: Physical Therapy

## 2022-08-21 VITALS — BP 150/103

## 2022-08-21 DIAGNOSIS — R2681 Unsteadiness on feet: Secondary | ICD-10-CM | POA: Diagnosis present

## 2022-08-21 DIAGNOSIS — R269 Unspecified abnormalities of gait and mobility: Secondary | ICD-10-CM | POA: Diagnosis present

## 2022-08-21 DIAGNOSIS — M6281 Muscle weakness (generalized): Secondary | ICD-10-CM | POA: Diagnosis present

## 2022-08-21 DIAGNOSIS — R262 Difficulty in walking, not elsewhere classified: Secondary | ICD-10-CM | POA: Insufficient documentation

## 2022-08-21 DIAGNOSIS — R2689 Other abnormalities of gait and mobility: Secondary | ICD-10-CM | POA: Diagnosis present

## 2022-08-22 ENCOUNTER — Ambulatory Visit: Payer: Medicare HMO | Attending: Cardiovascular Disease | Admitting: Cardiovascular Disease

## 2022-08-22 ENCOUNTER — Encounter: Payer: Self-pay | Admitting: Physical Therapy

## 2022-08-22 ENCOUNTER — Encounter: Payer: Self-pay | Admitting: Cardiovascular Disease

## 2022-08-22 ENCOUNTER — Other Ambulatory Visit: Payer: Self-pay

## 2022-08-22 VITALS — BP 148/92 | HR 85 | Ht 67.0 in | Wt 190.6 lb

## 2022-08-22 DIAGNOSIS — I6521 Occlusion and stenosis of right carotid artery: Secondary | ICD-10-CM

## 2022-08-22 DIAGNOSIS — I10 Essential (primary) hypertension: Secondary | ICD-10-CM | POA: Diagnosis not present

## 2022-08-22 DIAGNOSIS — E782 Mixed hyperlipidemia: Secondary | ICD-10-CM | POA: Diagnosis not present

## 2022-08-22 DIAGNOSIS — Z8673 Personal history of transient ischemic attack (TIA), and cerebral infarction without residual deficits: Secondary | ICD-10-CM

## 2022-08-22 MED ORDER — AMLODIPINE BESYLATE 5 MG PO TABS: 5.0000 mg | ORAL_TABLET | Freq: Every day | ORAL | 3 refills | Status: DC

## 2022-08-22 MED ORDER — ROSUVASTATIN CALCIUM 10 MG PO TABS: 10.0000 mg | ORAL_TABLET | Freq: Every day | ORAL | 3 refills | Status: AC

## 2022-08-22 NOTE — Patient Instructions (Addendum)
Medication Instructions:  Your physician has recommended you make the following change in your medication:  START: Amlodipine '5mg'$  daily INCREASE: Rosuvastatin '10mg'$  daily.  *If you need a refill on your cardiac medications before your next appointment, please call your pharmacy*   Lab Work: NONE If you have labs (blood work) drawn today and your tests are completely normal, you will receive your results only by: [place] (if you have MyChart) OR A paper copy in the mail If you have any lab test that is abnormal or we need to change your treatment, we will call you to review the results.   Testing/Procedures: NONE   Follow-Up: At [facility], you and your health needs are our priority.  As part of our continuing mission to provide you with exceptional heart care, we have created designated Provider Care Teams.  These Care Teams include your primary Cardiologist (physician) and Advanced Practice Providers (APPs -  Physician Assistants and Nurse Practitioners) who all work together to provide you with the care you need, when you need it.  We recommend signing up for the patient portal called "MyChart".  Sign up information is provided on this After Visit Summary.  MyChart is used to connect with patients for Virtual Visits (Telemedicine).  Patients are able to view lab/test results, encounter notes, upcoming appointments, etc.  Non-urgent messages can be sent to your provider as well.   To learn more about what you can do with MyChart, go to [email or web address].    Your next appointment:   1 year(s)  Provider:   Evalina Field, MD     Other Instructions Referring to Monica Martinez, MD at Vascular Surgery.

## 2022-08-27 ENCOUNTER — Ambulatory Visit (HOSPITAL_COMMUNITY)
Admission: RE | Admit: 2022-08-27 | Discharge: 2022-08-27 | Disposition: A | Discharge status: Home / Self Care | Payer: Medicare HMO | Source: Ambulatory Visit | Attending: Surgery | Admitting: Surgery

## 2022-08-27 DIAGNOSIS — I6521 Occlusion and stenosis of right carotid artery: Secondary | ICD-10-CM

## 2022-08-28 ENCOUNTER — Ambulatory Visit: Payer: Medicare HMO | Attending: Family Medicine

## 2022-08-28 ENCOUNTER — Ambulatory Visit: Payer: Medicare HMO | Admitting: Vascular Surgery

## 2022-08-28 ENCOUNTER — Encounter: Payer: Self-pay | Admitting: Vascular Surgery

## 2022-08-28 VITALS — BP 133/92 | HR 82 | Temp 98.0°F | Resp 16 | Ht 66.0 in | Wt 188.0 lb

## 2022-08-28 DIAGNOSIS — R2689 Other abnormalities of gait and mobility: Secondary | ICD-10-CM | POA: Insufficient documentation

## 2022-08-28 DIAGNOSIS — R269 Unspecified abnormalities of gait and mobility: Secondary | ICD-10-CM | POA: Insufficient documentation

## 2022-08-28 DIAGNOSIS — R262 Difficulty in walking, not elsewhere classified: Secondary | ICD-10-CM | POA: Diagnosis present

## 2022-08-28 DIAGNOSIS — R2681 Unsteadiness on feet: Secondary | ICD-10-CM | POA: Insufficient documentation

## 2022-08-28 DIAGNOSIS — I6529 Occlusion and stenosis of unspecified carotid artery: Secondary | ICD-10-CM | POA: Insufficient documentation

## 2022-08-28 DIAGNOSIS — R278 Other lack of coordination: Secondary | ICD-10-CM | POA: Diagnosis present

## 2022-08-28 DIAGNOSIS — I6521 Occlusion and stenosis of right carotid artery: Secondary | ICD-10-CM | POA: Diagnosis not present

## 2022-08-28 NOTE — Progress Notes (Signed)
Patient name: Scott Spence MRN: [id number] DOB: 1950-08-15 Sex: male  REASON FOR CONSULT: Evaluate carotid artery disease   HPI: Scott Spence is a 72 y.o. male, history of hypertension, BPH, stage III CKD that presents for evaluation of carotid artery disease.  Patient previously presented to the ED with acute dizziness and vertigo.  He underwent MRI brain on 08/07/2022 that did not show any acute abnormality.  There was question of a possible brainstem stroke by neurology although this was not shown on the MRI.  CTA neck during the same admission showed a 70% right ICA stenosis with some vertebral artery disease.  Today patient states his dizziness is better and has completely resolved.  He has seen a neurologist at [facility] who did not feel he had a stroke and feels this is benign positional vertigo.  Patient denies any history of strokes or TIAs.  He is on aspirin Plavix and statin.  States he will stop Plavix after 30 days per neurology.  Feels he is at his neurologic baseline today.  Past Medical History:  Diagnosis Date   BPH with urinary obstruction    Chronic kidney disease    hemorrhagic nephritis as a child   Complication of anesthesia    collapsed lung with local for LUE surgery   GERD (gastroesophageal reflux disease)    Gout    History of kidney stones    Hypertension    Stroke [facility])     Past Surgical History:  Procedure Laterality Date   APPENDECTOMY     FOOT ARTHRODESIS Right 03/29/2022   Procedure: SUBTALAR ARTHRODESIS;  Surgeon: Wylene Simmer, MD;  Location: [place];  Service: Orthopedics;  Laterality: Right;   HERNIA REPAIR     HOLEP-LASER ENUCLEATION OF THE PROSTATE WITH MORCELLATION N/A 03/09/2022   Procedure: HOLEP-LASER ENUCLEATION OF THE PROSTATE WITH MORCELLATION;  Surgeon: Billey Co, MD;  Location: [facility];  Service: Urology;  Laterality: N/A;   KIDNEY STONE SURGERY     PROSTATECTOMY     TOTAL HIP ARTHROPLASTY Right 04/01/2018    Procedure: TOTAL HIP ARTHROPLASTY ANTERIOR APPROACH;  Surgeon: Hessie Knows, MD;  Location: [facility];  Service: Orthopedics;  Laterality: Right;    Family History  Problem Relation Age of Onset   Heart attack Father    Prostate cancer Neg Hx    Bladder Cancer Neg Hx    Kidney cancer Neg Hx     SOCIAL HISTORY: Social History   Socioeconomic History   Marital status: Married    Spouse name: Not on file   Number of children: 2   Years of education: Not on file   Highest education level: Not on file  Occupational History   Occupation: Retired - [employer]  Tobacco Use   Smoking status: Never    Passive exposure: Never   Smokeless tobacco: Never  Vaping Use   Vaping Use: Never used  Substance and Sexual Activity   Alcohol use: Yes    Alcohol/week: 14.0 standard drinks of alcohol    Types: 14 Glasses of wine per week    Comment: 3-4 glasses a day   Drug use: No   Sexual activity: Yes    Birth control/protection: None  Other Topics Concern   Not on file  Social History Narrative   Not on file   Social Determinants of Health   Financial Resource Strain: Not on file  Food Insecurity: No Food Insecurity (08/07/2022)   Hunger Vital Sign  Worried About Charity fundraiser in the Last Year: Never true    [place] in the Last Year: Never true  Transportation Needs: No Transportation Needs (08/07/2022)   PRAPARE - Hydrologist (Medical): No    Lack of Transportation (Non-Medical): No  Physical Activity: Not on file  Stress: Not on file  Social Connections: Not on file  Intimate Partner Violence: Not At Risk (08/07/2022)   Humiliation, Afraid, Rape, and Kick questionnaire    Fear of Current or Ex-Partner: No    Emotionally Abused: No    Physically Abused: No    Sexually Abused: No    Allergies  Allergen Reactions   Meperidine Other (See Comments)    GI Upset   Sulfa Antibiotics Hives   Gabapentin Other (See  Comments)    Migraine HA   Lisinopril Cough    Current Outpatient Medications  Medication Sig Dispense Refill   allopurinol (ZYLOPRIM) 300 MG tablet Take 300 mg by mouth daily.      amLODipine (NORVASC) 5 MG tablet Take 1 tablet (5 mg total) by mouth daily. 90 tablet 3   aspirin EC 81 MG tablet Take 1 tablet (81 mg total) by mouth daily. 30 tablet 12   clopidogrel (PLAVIX) 75 MG tablet Take 1 tablet (75 mg total) by mouth daily for 19 days. 19 tablet 0   losartan (COZAAR) 100 MG tablet Take 100 mg by mouth daily.      omeprazole (PRILOSEC) 20 MG capsule Take 20 mg by mouth daily.      rosuvastatin (CRESTOR) 10 MG tablet Take 1 tablet (10 mg total) by mouth daily. 90 tablet 3   No current facility-administered medications for this visit.    REVIEW OF SYSTEMS:  '[X]'$  denotes positive finding, '[ ]'$  denotes negative finding Cardiac  Comments:  Chest pain or chest pressure:    Shortness of breath upon exertion:    Short of breath when lying flat:    Irregular heart rhythm:        Vascular    Pain in calf, thigh, or hip brought on by ambulation:    Pain in feet at night that wakes you up from your sleep:     Blood clot in your veins:    Leg swelling:         Pulmonary    Oxygen at home:    Productive cough:     Wheezing:         Neurologic    Sudden weakness in arms or legs:     Sudden numbness in arms or legs:     Sudden onset of difficulty speaking or slurred speech:    Temporary loss of vision in one eye:     Problems with dizziness:         Gastrointestinal    Blood in stool:     Vomited blood:         Genitourinary    Burning when urinating:     Blood in urine:        Psychiatric    Major depression:         Hematologic    Bleeding problems:    Problems with blood clotting too easily:        Skin    Rashes or ulcers:        Constitutional    Fever or chills:      PHYSICAL EXAM: Vitals:   08/28/22 0850 08/28/22 0254  BP: 125/85 (!) 133/92  Pulse: 80 82   Resp: 16   Temp: 98 F (36.7 C)   TempSrc: Temporal   Weight: 188 lb (85.3 kg)   Height: '5\' 6"'$  (1.676 m)     GENERAL: The patient is a well-nourished male, in no acute distress. The vital signs are documented above. CARDIAC: There is a regular rate and rhythm.  VASCULAR:  Bilateral radial pulses palpable Bilateral PT pulses palpable PULMONARY: No respiratory distress. ABDOMEN: Soft and non-tender. MUSCULOSKELETAL: There are no major deformities or cyanosis. NEUROLOGIC: No focal weakness or paresthesias are detected.  Cranial nerves II through XII grossly intact. SKIN: There are no ulcers or rashes noted. PSYCHIATRIC: The patient has a normal affect.  DATA:   Carotid Arterial Duplex Study   Patient Name:  Scott Spence  Date of Exam:   08/27/2022  Medical Rec #: [id number]     Accession #:    [id number]  Date of Birth: 09-29-1950      Patient Gender: M  Patient Age:   39 years  Exam Location:  [facility]  Procedure:      VAS US CAROTID  Referring Phys: Monica Martinez    ---------------------------------------------------------------------------  -----    Indications:      Carotid artery disease.  Risk Factors:      Hypertension.  Comparison Study:  CT Angio head neck 08/07/2022 reveals 70% right ICA  stenosis                    and Left ICA stenosis in the 50% stenosis range. Severe  right                    and mid left vertebral artery origin stenosis.                     Right ICA by duplex consistent with 60 - 79% stenosis.  Unable                    to oftain increased velocity in the left ICA.   Performing Technologist: Alvia Grove RVT     Examination Guidelines: A complete evaluation includes B-mode imaging,  spectral  Doppler, color Doppler, and power Doppler as needed of all accessible  portions  of each vessel. Bilateral testing is considered an integral part of a  complete  examination. Limited examinations for reoccurring indications  may be  performed  as noted.     Right Carotid Findings:  +----------+--------+--------+--------+------------------+--------+           PSV cm/sEDV cm/sStenosisPlaque DescriptionComments  +----------+--------+--------+--------+------------------+--------+  CCA Prox  135     30                                          +----------+--------+--------+--------+------------------+--------+  CCA Mid   122     29              heterogenous                +----------+--------+--------+--------+------------------+--------+  CCA Distal107     26              heterogenous                +----------+--------+--------+--------+------------------+--------+  ICA Prox  223     82      60-79%  heterogenous                +----------+--------+--------+--------+------------------+--------+  ICA Mid   115     31                                          +----------+--------+--------+--------+------------------+--------+  ICA Distal61      20                                          +----------+--------+--------+--------+------------------+--------+  ECA      113     19                                          +----------+--------+--------+--------+------------------+--------+   +----------+--------+-------+--------+-------------------+           PSV cm/sEDV cmsDescribeArm Pressure (mmHG)  +----------+--------+-------+--------+-------------------+  [id number]                   141                  +----------+--------+-------+--------+-------------------+   +---------+--------+--+--------+--+---------+  VertebralPSV cm/s41EDV cm/s14Antegrade  +---------+--------+--+--------+--+---------+      Left Carotid Findings:  +----------+--------+--------+--------+------------------+--------+           PSV cm/sEDV cm/sStenosisPlaque DescriptionComments  +----------+--------+--------+--------+------------------+--------+  CCA  Prox  118     34                                          +----------+--------+--------+--------+------------------+--------+  CCA Mid   138     37              heterogenous                +----------+--------+--------+--------+------------------+--------+  CCA Distal117     36              heterogenous                +----------+--------+--------+--------+------------------+--------+  ICA Prox  83      31      1-39%   heterogenous                +----------+--------+--------+--------+------------------+--------+  ICA Mid   87      27                                          +----------+--------+--------+--------+------------------+--------+  ICA Distal75      31                                          +----------+--------+--------+--------+------------------+--------+  ECA      122     29                                          +----------+--------+--------+--------+------------------+--------+   +----------+--------+--------+--------+-------------------+  PSV cm/sEDV cm/sDescribeArm Pressure (mmHG)  +----------+--------+--------+--------+-------------------+  [id number]    5               142                  +----------+--------+--------+--------+-------------------+   +---------+--------+--+--------+--+---------+  VertebralPSV cm/s57EDV cm/s17Antegrade  +---------+--------+--+--------+--+---------+     Summary:  Right Carotid: Velocities in the right ICA are consistent with a 60-79%                 stenosis.   Left Carotid: There is no evidence of stenosis in the left ICA.   Vertebrals:  Bilateral vertebral arteries demonstrate antegrade flow.  Subclavians: Right subclavian artery was stenotic. Normal flow  hemodynamics were               seen in the left subclavian artery.   *See table(s) above for measurements and observations.   CTA neck 08/07/2022: approximately 70% stenosis of the proximal  right ICA.  Severe right and mild left vertebral artery stenosis and the right vert is nondominant.  Assessment/Plan:  72 year old male presents for evaluation of carotid artery disease as a referral from Dr. Audie Box with cardiology.  He was recently in the hospital with dizziness and vertigo and had an MRI brain on 07/28/1622 that showed no acute abnormality.  There was documentation by neurology about a possible small brainstem stroke even though this was not demonstrated on MRI.  He did have a CTA that showed a 70% right ICA stenosis.  I reviewed these images.  His carotid duplex yesterday also confirms a moderate 60 to 79% right ICA stenosis by velocity criteria.  His neurologist he saw at [facility] feels that his presentation is consistent with benign positional vertigo which would correlate with his MRI that did not show any evidence of acute stroke.  I discussed for moderate asymptomatic carotid disease would recommend medical therapy.  He is currently on dual antiplatelet therapy plus statin.  I will see him again in 6 months with carotid duplex.  Discussed if his carotid artery disease on the right progresses above 80% would recommend surgical intervention for stroke risk reduction.   Marty Heck, MD [facility] Office: [phone]

## 2022-08-28 NOTE — Therapy (Signed)
OUTPATIENT PHYSICAL THERAPY NEURO TREATMENT NOTE   Patient Name: Scott Spence MRN: [id number] DOB:[birth year], 72 y.o., male Today's Date: 08/28/2022   PCP: Maryland Pink, MD  REFERRING PROVIDER: Maryland Pink, MD   END OF SESSION:  PT End of Session - 08/28/22 1740     Visit Number 2    Number of Visits 16    Date for PT Re-Evaluation 10/16/22    Authorization Time Period Cert through 10/30/71    Progress Note Due on Visit 10    PT Start Time 1151    PT Stop Time 1227    PT Time Calculation (min) 36 min    Equipment Utilized During Treatment Gait belt    Activity Tolerance Patient tolerated treatment well;Patient limited by pain    Behavior During Therapy WFL for tasks assessed/performed              Past Medical History:  Diagnosis Date   BPH with urinary obstruction    Chronic kidney disease    hemorrhagic nephritis as a child   Complication of anesthesia    collapsed lung with local for LUE surgery   GERD (gastroesophageal reflux disease)    Gout    History of kidney stones    Hypertension    Stroke [facility])    Past Surgical History:  Procedure Laterality Date   APPENDECTOMY     FOOT ARTHRODESIS Right 03/29/2022   Procedure: SUBTALAR ARTHRODESIS;  Surgeon: Wylene Simmer, MD;  Location: [place];  Service: Orthopedics;  Laterality: Right;   HERNIA REPAIR     HOLEP-LASER ENUCLEATION OF THE PROSTATE WITH MORCELLATION N/A 03/09/2022   Procedure: HOLEP-LASER ENUCLEATION OF THE PROSTATE WITH MORCELLATION;  Surgeon: Billey Co, MD;  Location: [facility];  Service: Urology;  Laterality: N/A;   KIDNEY STONE SURGERY     PROSTATECTOMY     TOTAL HIP ARTHROPLASTY Right 04/01/2018   Procedure: TOTAL HIP ARTHROPLASTY ANTERIOR APPROACH;  Surgeon: Hessie Knows, MD;  Location: [facility];  Service: Orthopedics;  Laterality: Right;   Patient Active Problem List   Diagnosis Date Noted   Carotid artery stenosis 08/28/2022   Vertigo, peripheral 08/07/2022    Hematuria 05/07/2022   Gross hematuria 05/06/2022   Acute urinary retention 05/06/2022   BPH S/P HoLEP prostatectomy 02/2022 05/06/2022   Status post total hip replacement, right 04/01/2018   Primary osteoarthritis of right hip 01/15/2018   Serrated adenoma of colon 12/30/2014   Anemia, iron deficiency 07/06/2013   Benign prostatic hyperplasia 07/06/2013   Bilateral sensorineural hearing loss 06/29/2010   Tinnitus 06/20/2010   Gouty arthropathy 05/25/2010   Esophageal reflux 12/27/2001   Essential hypertension 12/27/2001    ONSET DATE: 08/07/22  REFERRING DIAG: R26.81 (ICD-10-CM) - Unsteadiness on feet   THERAPY DIAG:  Unsteadiness on feet  Other lack of coordination  Difficulty in walking, not elsewhere classified  Rationale for Evaluation and Treatment: Rehabilitation  SUBJECTIVE:  SUBJECTIVE STATEMENT: Pt presents to PT ambulating without an AD. He reports he ambulated independently from lobby down to clinic, has been walking for past 4-5 days. Pt reports he is not really having dizziness, but states sometimes the dizziness comes and goes. He reports he saw a neurologist and is unsure if he needs more PT appointments. At neurologist he was treated with a maneuver for BPPV. He says his symptoms have not been present for the last couple days.  Pt reports his hip is feeling "a lot better," but reports he can feel a little pain still. He says every once in a while "it catches and causes pain." Regarding foot pain pt reports he saw physician and that they think it is plantar fasciitis. Pt reports he is able to walk a block without pain.    Right hip pain? None currently at rest Pt accompanied by: self  PERTINENT HISTORY:  Pt reports he had a stroke which has caused some balance issues. Prior to  stroke pt was getting therapy for his R foot following foot surgery ( ankle arthrodesis) and he is still doing exercises for these but he felt his stroke was more important so he has prioritized his balance and PT. Pt reports his balance is still being effected by dizziness. Pt reports his dizziness is worsened by turning his head quickly and rolling over in bed and sitting up from bed. Pt reports he does not feel like the room is spinning but he does feel lightheaded. Pt had orthostatic testing in hospital and there were no positive findings. Pt reports he had scans but did not find anything showing a CVA ut a cardiologist stated he had the s/s of stroke with testing performed. Pt reports he has had continued high blood pressure with most recent 150/107 this afternoon prior to eval.   PAIN:  Are you having pain? Yes: NPRS scale: 5/10 Pain location: Right Hip  Pain description: feels musculature ( on the side and back)  Aggravating factors: sitting then going to standing position.  Relieving factors: Ice   PRECAUTIONS: None  WEIGHT BEARING RESTRICTIONS: No  FALLS: Has patient fallen in last 6 months? Yes. Number of falls 1 when having stroke prior to hospitilization  LIVING ENVIRONMENT: Lives with: lives with their family Lives in: House/apartment Stairs: Yes: External: 4 steps; on left going up Has following equipment at home: Single point cane, Walker - 2 wheeled, and bed side commode  PLOF: Independent  PATIENT GOALS: Get balance back, get rid of dizziness and be able to walk up to 3 miles a day   OBJECTIVE:   DIAGNOSTIC FINDINGS: MRI on 1/16: IMPRESSION: No evidence of acute intracranial abnormality.   Moderate chronic small-vessel ischemic changes within the cerebral white matter. To a lesser extent, chronic small vessel ischemic changes are also present within the bilateral basal ganglia.  CTA head:   No emergent large vessel occlusion or proximal  hemodynamically significant stenosis.   CTA neck:   1. 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 2. 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 Right vertebral artery is non dominant. 3. 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   COGNITION: Overall cognitive status: Within functional limits for tasks assessed   SENSATION: Not tested  COORDINATION: WNL with finger to nose      POSTURE: No Significant postural limitations  LOWER EXTREMITY ROM:   WNL   Active  Right Eval Left Eval  Hip flexion  Hip extension    Hip abduction    Hip adduction    Hip internal rotation    Hip external rotation    Knee flexion    Knee extension    Ankle dorsiflexion    Ankle plantarflexion    Ankle inversion    Ankle eversion     (Blank rows = not tested)   LOWER EXTREMITY MMT:    MMT Right Eval Left Eval  Hip flexion * 5  Hip extension    Hip abduction * 5  Hip adduction * 5  Hip internal rotation * 5  Hip external rotation * 5  Knee flexion * 5  Knee extension * 5  Ankle dorsiflexion    Ankle plantarflexion    Ankle inversion    Ankle eversion    (Blank rows = not tested) *= deferred due to insidious onset of hip pain prior to arriving to clinic and pain limiting the hip strength.   BED MOBILITY: Able to perform independently but impaired due to dizziness  Supine to sit dizziness, has to be careful Rolling to Right sometimes this causes dizziness  Rolling to Left Sometimes this causes dizziness   TRANSFERS: Assistive device utilized: None  Sit to stand: Complete Independence Stand to sit: Complete Independence Chair to chair: Complete Independence Floor: Complete Independence    GAIT: Gait pattern: decreased step length- Left, decreased stance time- Right, and decreased ankle dorsiflexion- Right Distance walked: 50 feet Assistive device utilized: None Level of assistance: Complete Independence Comments: Pt had R  foot surgery but PT was interupted secondary to hopsitilization for current problem being treated.   FUNCTIONAL TESTS:  5 times sit to stand: Next session secondary to hip pain  6 minute walk test: Complete future session seocndary to hip pain at initial eval.  Berg Balance Scale: 47    Oculomotor screen: ROM: WNL Spontaneous nystagmus: absent  Convergence/divergence: abnormal convergence, L eye difficulty converging  Smooth pursuits: abnormal, saccadic Saccades: WNL  PATIENT SURVEYS:  FOTO Complete visit 2  *not taken today as pt unsure if returning to this PT clinic  TODAY'S TREATMENT:                                                                                                                              DATE: 08/28/22   TherAct: BP, seated, LUE, prior to assessment (pt reports he takes BP on LUE): 150/99 mmHg, HR 79 bpm  Comments: Pt reports BP can be high because he has white coat syndrome  Reassessed BP following a few minutes of rest and cuing for 2:4 deep breathing technique: 153/81 mmHg, 79 bpm  5xSTS: 8 sec - hands-free, slight posterior LOB with stand>sit. No hip pain with testing  M-CTSIB: able to complete all conditions but condition 4, increased sway condition 2 noted by no LOB. No hip pain with testing.   Pt reports no hip pain prior to initiating 6MWT- 6MWT - pt able to  complete 4 laps (592 ft) and stopped test at 4 minutes. Test was discontinued due to pt reporting feeling onset of R hip pain. Pt indicated greater trochanter as region of pain. Pt reports "it feels like a muscle that hasn't been worked." His pain improves with seated rest but did not completely resolve. He reports he has had this pain since he stared walking again "4-5 days ago."  PT advised pt to keep one follow-up appt if he is not yet returning to other clinic to address his foot. PT informed pt he can cancel this follow-up if he restarts at other clinic or if his dizziness has not  returned.   PATIENT EDUCATION: Education details: plan, further assessment, indications Person educated: Patient Education method: Explanation Education comprehension: verbalized understanding  HOME EXERCISE PROGRAM: To begin visit 2.   GOALS: Goals reviewed with patient? Yes  SHORT TERM GOALS: Target date: 09/18/2022   Patient will be independent in home exercise program to improve strength/mobility for better functional independence with ADLs. Baseline: No HEP currently  Goal status: INITIAL   LONG TERM GOALS: Target date: 10/16/2022    Patient will improve Berg balance score by 6 points or more indicating improvement in fall risk as well as improvement in balance Baseline: 47 Goal status: INITIAL  2.  Patient will improve focus on therapeutic outcome survey scale by 10 percentage points or greater to indicate improvement in subjective level of function. Baseline: To assess visit to; 2/6: not yet assessed pending pt decision to continue at this clinic Goal status: INITIAL  3.  Patient will improve 6-minute walk test to 1250 feet or greater to indicate root ability to return to walking for distance as he did prior to his hospitalization Baseline: 2 test visit 2, did not test visit 1 secondary to right hip pain; 08/28/22: Pt unable to complete full test due to onset R hip pain (See above), completes 4 minutes and ambulates 592 ft without AD Goal status: INITIAL  4.  Patient will improve 5 times sit to stand to 12 seconds or less in order to indicate improved lower extremity muscular strength and power and also decrease risk of falls Baseline: Test visit 2, not tested visit 1 secondary to right hip pain; 08/28/22: 8 sec hands-free no pain with test Goal status: MET   ASSESSMENT:  CLINICAL IMPRESSION: Further assessment completed on this date. Pt completed 5xSTS in 8 sec, under goal time of 12 sec. Pt unable to complete full 6MWT on this date due to hip pain (this pain improved  with rest), will continue to monitor. PT suggests follow-up with physician if pain does not improve within another week or starts worsening. Pt reports he has not felt dizzy since maneuver provided by neurologist. While pt no longer experiencing dizziness, he was unable to complete condition 4 on M-CTSIB, indicating decreased use of vestibular cues to maintain balance. PT suggested pt keep one follow-up appointment in case symptoms return, otherwise pt can cancel, or pt can cancel if he resumes therapy at different clinic (this is currently what pt would like to do). Pt agreeable to plan. Patient will benefit from skilled physical therapy interventions in order to improve his balance, improve his mobility, previous endurance, and improve his quality of life.  OBJECTIVE IMPAIRMENTS: Abnormal gait, decreased activity tolerance, decreased balance, decreased endurance, decreased mobility, difficulty walking, impaired vision/preception, and pain.   ACTIVITY LIMITATIONS: squatting, bed mobility, and locomotion level  PARTICIPATION LIMITATIONS: driving and community activity  PERSONAL FACTORS:  1-2 comorbidities: HTN, foot arthrodesis   are also affecting patient's functional outcome.   REHAB POTENTIAL: Good  CLINICAL DECISION MAKING: Evolving/moderate complexity  EVALUATION COMPLEXITY: Moderate  PLAN:  PT FREQUENCY: 2x/week  PT DURATION: 8 weeks  PLANNED INTERVENTIONS: Therapeutic exercises, Therapeutic activity, Neuromuscular re-education, Balance training, Gait training, Patient/Family education, Self Care, Joint mobilization, Joint manipulation, Stair training, Vestibular training, Canalith repositioning, Visual/preceptual remediation/compensation, Dry Needling, and Manual therapy  PLAN FOR NEXT SESSION: 1: FOTO, vestibular training ( VOR), HEP, begin POC 2: assess R ankle further 3:assess R LE strength dependent on hip pain on R, assess hip pain on R if indicated    Zollie Pee,  PT 08/28/2022, 5:51 PM

## 2022-08-29 NOTE — Therapy (Unsigned)
OUTPATIENT PHYSICAL THERAPY NEURO TREATMENT NOTE   Patient Name: Scott Spence MRN: [id number] DOB:January 15, 1951, 72 y.o., male Today's Date: 08/30/2022   PCP: Maryland Pink, MD  REFERRING PROVIDER: Maryland Pink, MD   END OF SESSION:  PT End of Session - 08/30/22 1503     Visit Number 3    Number of Visits 16    Date for PT Re-Evaluation 10/16/22    Authorization Time Period Cert through 10/30/79    Progress Note Due on Visit 10    PT Start Time 0149    PT Stop Time 0228    PT Time Calculation (min) 39 min    Equipment Utilized During Treatment Gait belt    Activity Tolerance Patient tolerated treatment well;Patient limited by pain    Behavior During Therapy WFL for tasks assessed/performed               Past Medical History:  Diagnosis Date   BPH with urinary obstruction    Chronic kidney disease    hemorrhagic nephritis as a child   Complication of anesthesia    collapsed lung with local for LUE surgery   GERD (gastroesophageal reflux disease)    Gout    History of kidney stones    Hypertension    Stroke [facility])    Past Surgical History:  Procedure Laterality Date   APPENDECTOMY     FOOT ARTHRODESIS Right 03/29/2022   Procedure: SUBTALAR ARTHRODESIS;  Surgeon: Wylene Simmer, MD;  Location: [facility];  Service: Orthopedics;  Laterality: Right;   HERNIA REPAIR     HOLEP-LASER ENUCLEATION OF THE PROSTATE WITH MORCELLATION N/A 03/09/2022   Procedure: HOLEP-LASER ENUCLEATION OF THE PROSTATE WITH MORCELLATION;  Surgeon: Billey Co, MD;  Location: [facility];  Service: Urology;  Laterality: N/A;   KIDNEY STONE SURGERY     PROSTATECTOMY     TOTAL HIP ARTHROPLASTY Right 04/01/2018   Procedure: TOTAL HIP ARTHROPLASTY ANTERIOR APPROACH;  Surgeon: Hessie Knows, MD;  Location: [facility];  Service: Orthopedics;  Laterality: Right;   Patient Active Problem List   Diagnosis Date Noted   Carotid artery stenosis 08/28/2022   Vertigo, peripheral 08/07/2022    Hematuria 05/07/2022   Gross hematuria 05/06/2022   Acute urinary retention 05/06/2022   BPH S/P HoLEP prostatectomy 02/2022 05/06/2022   Status post total hip replacement, right 04/01/2018   Primary osteoarthritis of right hip 01/15/2018   Serrated adenoma of colon 12/30/2014   Anemia, iron deficiency 07/06/2013   Benign prostatic hyperplasia 07/06/2013   Bilateral sensorineural hearing loss 06/29/2010   Tinnitus 06/20/2010   Gouty arthropathy 05/25/2010   Esophageal reflux 12/27/2001   Essential hypertension 12/27/2001    ONSET DATE: 08/07/22  REFERRING DIAG: R26.81 (ICD-10-CM) - Unsteadiness on feet   THERAPY DIAG:  Unsteadiness on feet  Difficulty in walking, not elsewhere classified  Abnormality of gait and mobility  Other abnormalities of gait and mobility  Rationale for Evaluation and Treatment: Rehabilitation  SUBJECTIVE:  SUBJECTIVE STATEMENT: Pt reports continued difficulty with balance situations. He is still working on contacting his MD regarding therapy for his foot but would like to continue with PT for his balance in the meantime. No dizziness noted since last session.   Right hip pain? None currently at rest Pt accompanied by: self  PERTINENT HISTORY:  Pt reports he had a stroke which has caused some balance issues. Prior to stroke pt was getting therapy for his R foot following foot surgery ( ankle arthrodesis) and he is still doing exercises for these but he felt his stroke was more important so he has prioritized his balance and PT. Pt reports his balance is still being effected by dizziness. Pt reports his dizziness is worsened by turning his head quickly and rolling over in bed and sitting up from bed. Pt reports he does not feel like the room is spinning but he does feel  lightheaded. Pt had orthostatic testing in hospital and there were no positive findings. Pt reports he had scans but did not find anything showing a CVA ut a cardiologist stated he had the s/s of stroke with testing performed. Pt reports he has had continued high blood pressure with most recent 150/107 this afternoon prior to eval.   PAIN:  Are you having pain? Yes: NPRS scale: 5/10 Pain location: Right Hip  Pain description: feels musculature ( on the side and back)  Aggravating factors: sitting then going to standing position.  Relieving factors: Ice   PRECAUTIONS: None  WEIGHT BEARING RESTRICTIONS: No  FALLS: Has patient fallen in last 6 months? Yes. Number of falls 1 when having stroke prior to hospitilization  LIVING ENVIRONMENT: Lives with: lives with their family Lives in: House/apartment Stairs: Yes: External: 4 steps; on left going up Has following equipment at home: Single point cane, Walker - 2 wheeled, and bed side commode  PLOF: Independent  PATIENT GOALS: Get balance back, get rid of dizziness and be able to walk up to 3 miles a day   OBJECTIVE:   DIAGNOSTIC FINDINGS: MRI on 1/16: IMPRESSION: No evidence of acute intracranial abnormality.   Moderate chronic small-vessel ischemic changes within the cerebral white matter. To a lesser extent, chronic small vessel ischemic changes are also present within the bilateral basal ganglia.  CTA head:   No emergent large vessel occlusion or proximal hemodynamically significant stenosis.   CTA neck:   1. 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 2. 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 Right vertebral artery is non dominant. 3. 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   COGNITION: Overall cognitive status: Within functional limits for tasks assessed   SENSATION: Not tested  COORDINATION: WNL with finger to nose      POSTURE: No Significant postural  limitations  LOWER EXTREMITY ROM:   WNL   Active  Right Eval Left Eval  Hip flexion    Hip extension    Hip abduction    Hip adduction    Hip internal rotation    Hip external rotation    Knee flexion    Knee extension    Ankle dorsiflexion    Ankle plantarflexion    Ankle inversion    Ankle eversion     (Blank rows = not tested)   LOWER EXTREMITY MMT:    MMT Right Eval Left Eval  Hip flexion * 5  Hip extension    Hip abduction * 5  Hip adduction * 5  Hip internal  rotation * 5  Hip external rotation * 5  Knee flexion * 5  Knee extension * 5  Ankle dorsiflexion    Ankle plantarflexion    Ankle inversion    Ankle eversion    (Blank rows = not tested) *= deferred due to insidious onset of hip pain prior to arriving to clinic and pain limiting the hip strength.   BED MOBILITY: Able to perform independently but impaired due to dizziness  Supine to sit dizziness, has to be careful Rolling to Right sometimes this causes dizziness  Rolling to Left Sometimes this causes dizziness   TRANSFERS: Assistive device utilized: None  Sit to stand: Complete Independence Stand to sit: Complete Independence Chair to chair: Complete Independence Floor: Complete Independence    GAIT: Gait pattern: decreased step length- Left, decreased stance time- Right, and decreased ankle dorsiflexion- Right Distance walked: 50 feet Assistive device utilized: None Level of assistance: Complete Independence Comments: Pt had R foot surgery but PT was interupted secondary to hopsitilization for current problem being treated.   FUNCTIONAL TESTS:  5 times sit to stand: Next session secondary to hip pain  6 minute walk test: Complete future session seocndary to hip pain at initial eval.  Berg Balance Scale: 47    Oculomotor screen: ROM: WNL Spontaneous nystagmus: absent  Convergence/divergence: abnormal convergence, L eye difficulty converging  Smooth pursuits: abnormal,  saccadic Saccades: WNL  PATIENT SURVEYS:  FOTO Complete visit 2  *not taken today as pt unsure if returning to this PT clinic  TODAY'S TREATMENT:                                                                                                                              DATE: 08/30/22   NMR: focus on balance interventions to challenge vestibular aspect of balance while limiting proprioception and visual dependency  1 LE on floor, 1 LE on step 3 x 45 sec ea LE with eyes closed  Completed FOTO intake   Airex NBOS with eyes closed 3 x 1 min holds   Ambulaiton with looking for various sticky notes and reading them out challenging head turns with ambulaiton, no LOB but some foot drag noted. Pt is able to overcome foot drag with sustained attention. X 4 lengths in long therapy hallway   Sidestepping on airex beam x 5 laps  Unless otherwise stated, CGA was provided and gait belt donned in order to ensure pt safety    PATIENT EDUCATION: Education details: plan, further assessment, indications Person educated: Patient Education method: Explanation Education comprehension: verbalized understanding  HOME EXERCISE PROGRAM: To begin visit 2.   GOALS: Goals reviewed with patient? Yes  SHORT TERM GOALS: Target date: 09/18/2022   Patient will be independent in home exercise program to improve strength/mobility for better functional independence with ADLs. Baseline: No HEP currently  Goal status: INITIAL   LONG TERM GOALS: Target date: 10/16/2022    Patient will improve Berg balance score  by 6 points or more indicating improvement in fall risk as well as improvement in balance Baseline: 47 Goal status: INITIAL  2.  Patient will improve focus on therapeutic outcome survey scale by 10 percentage points or greater to indicate improvement in subjective level of function. Baseline: To assess visit to; 2/6: not yet assessed pending pt decision to continue at this clinic Goal status:  INITIAL  3.  Patient will improve 6-minute walk test to 1250 feet or greater to indicate ability to return to walking for distance as he did prior to his hospitalization Baseline: 2 test visit 2, did not test visit 1 secondary to right hip pain; 08/28/22: Pt unable to complete full test due to onset R hip pain (See above), completes 4 minutes and ambulates 592 ft without AD Goal status: INITIAL  4.  Patient will improve 5 times sit to stand to 12 seconds or less in order to indicate improved lower extremity muscular strength and power and also decrease risk of falls Baseline: Test visit 2, not tested visit 1 secondary to right hip pain; 08/28/22: 8 sec hands-free no pain with test Goal status: MET   ASSESSMENT:  CLINICAL IMPRESSION: Pt presents  motivated to advance progress toward goals in order to maximize independence and safety at home. Pt requires high level assistance and cuing for completion of exercises in order to provide adequate level of stimulation and perturbation. Author allows pt as much opportunity as possible to perform independent righting strategies, only stepping in when pt is unable to prevent falling to floor. Pt closely monitored throughout session for safe vitals response and to maximize patient safety during interventions. Focussed on interventions to target balance with vestibular control. Pt continues to demonstrate progress toward goals AEB progression of some interventions this date either in volume or intensity.    OBJECTIVE IMPAIRMENTS: Abnormal gait, decreased activity tolerance, decreased balance, decreased endurance, decreased mobility, difficulty walking, impaired vision/preception, and pain.   ACTIVITY LIMITATIONS: squatting, bed mobility, and locomotion level  PARTICIPATION LIMITATIONS: driving and community activity  PERSONAL FACTORS: 1-2 comorbidities: HTN, foot arthrodesis   are also affecting patient's functional outcome.   REHAB POTENTIAL:  Good  CLINICAL DECISION MAKING: Evolving/moderate complexity  EVALUATION COMPLEXITY: Moderate  PLAN:  PT FREQUENCY: 2x/week  PT DURATION: 8 weeks  PLANNED INTERVENTIONS: Therapeutic exercises, Therapeutic activity, Neuromuscular re-education, Balance training, Gait training, Patient/Family education, Self Care, Joint mobilization, Joint manipulation, Stair training, Vestibular training, Canalith repositioning, Visual/preceptual remediation/compensation, Dry Needling, and Manual therapy  PLAN FOR NEXT SESSION: 1: FOTO, vestibular training ( VOR), HEP, begin POC 2: assess R ankle further 3:assess R LE strength dependent on hip pain on R, assess hip pain on R if indicated    Particia Lather, PT 08/30/2022, 3:03 PM

## 2022-08-30 ENCOUNTER — Ambulatory Visit: Payer: Medicare HMO | Admitting: Physical Therapy

## 2022-08-30 DIAGNOSIS — R2681 Unsteadiness on feet: Secondary | ICD-10-CM

## 2022-08-30 DIAGNOSIS — R262 Difficulty in walking, not elsewhere classified: Secondary | ICD-10-CM

## 2022-08-30 DIAGNOSIS — R269 Unspecified abnormalities of gait and mobility: Secondary | ICD-10-CM

## 2022-08-30 DIAGNOSIS — R2689 Other abnormalities of gait and mobility: Secondary | ICD-10-CM

## 2022-08-31 ENCOUNTER — Other Ambulatory Visit: Payer: Self-pay

## 2022-08-31 DIAGNOSIS — I6521 Occlusion and stenosis of right carotid artery: Secondary | ICD-10-CM

## 2022-09-04 ENCOUNTER — Ambulatory Visit: Payer: Medicare HMO

## 2022-09-06 ENCOUNTER — Ambulatory Visit: Payer: Medicare HMO

## 2022-09-11 ENCOUNTER — Ambulatory Visit: Payer: Medicare HMO

## 2022-09-13 ENCOUNTER — Ambulatory Visit: Payer: Medicare HMO

## 2022-09-17 ENCOUNTER — Ambulatory Visit: Payer: Medicare HMO | Admitting: Physical Therapy

## 2022-09-18 ENCOUNTER — Ambulatory Visit: Payer: Medicare HMO

## 2022-09-19 ENCOUNTER — Ambulatory Visit: Payer: Medicare HMO | Admitting: Physical Therapy

## 2022-09-20 ENCOUNTER — Ambulatory Visit: Payer: Medicare HMO | Admitting: Physical Therapy

## 2022-09-21 ENCOUNTER — Ambulatory Visit: Payer: Medicare HMO

## 2022-09-24 ENCOUNTER — Ambulatory Visit: Payer: Medicare HMO

## 2022-09-25 ENCOUNTER — Ambulatory Visit: Payer: Medicare HMO

## 2022-09-26 ENCOUNTER — Ambulatory Visit: Payer: Medicare HMO | Admitting: Physical Therapy

## 2022-09-27 ENCOUNTER — Ambulatory Visit: Payer: Medicare HMO

## 2022-10-01 ENCOUNTER — Ambulatory Visit: Payer: Medicare HMO

## 2022-10-04 ENCOUNTER — Ambulatory Visit: Payer: Medicare HMO

## 2022-10-08 ENCOUNTER — Ambulatory Visit: Payer: Medicare HMO

## 2022-10-09 ENCOUNTER — Ambulatory Visit: Payer: Medicare HMO | Admitting: Physical Therapy

## 2022-10-10 ENCOUNTER — Ambulatory Visit: Payer: Medicare HMO

## 2022-10-15 ENCOUNTER — Ambulatory Visit: Payer: Medicare HMO

## 2022-10-16 ENCOUNTER — Ambulatory Visit: Payer: Medicare HMO

## 2022-10-18 ENCOUNTER — Ambulatory Visit: Payer: Medicare HMO | Admitting: Physical Therapy

## 2022-10-22 ENCOUNTER — Ambulatory Visit: Payer: Medicare HMO

## 2022-10-23 ENCOUNTER — Ambulatory Visit: Payer: Medicare HMO | Admitting: Physical Therapy

## 2022-10-24 ENCOUNTER — Ambulatory Visit: Payer: Medicare HMO

## 2022-10-25 ENCOUNTER — Ambulatory Visit: Payer: Medicare HMO

## 2022-10-29 ENCOUNTER — Ambulatory Visit: Payer: Medicare HMO

## 2022-10-30 ENCOUNTER — Ambulatory Visit: Payer: Medicare HMO | Admitting: Physical Therapy

## 2022-10-31 ENCOUNTER — Ambulatory Visit: Payer: Medicare HMO

## 2022-11-01 ENCOUNTER — Ambulatory Visit: Payer: Medicare HMO | Admitting: Physical Therapy

## 2022-11-05 ENCOUNTER — Ambulatory Visit: Payer: Medicare HMO

## 2022-11-06 ENCOUNTER — Ambulatory Visit: Payer: Medicare HMO | Admitting: Physical Therapy

## 2022-11-07 ENCOUNTER — Ambulatory Visit: Payer: Medicare HMO

## 2022-11-08 ENCOUNTER — Ambulatory Visit: Payer: Medicare HMO

## 2022-11-12 ENCOUNTER — Ambulatory Visit: Payer: Medicare HMO

## 2022-11-13 ENCOUNTER — Ambulatory Visit: Payer: Medicare HMO | Admitting: Physical Therapy

## 2022-11-14 ENCOUNTER — Ambulatory Visit: Payer: Medicare HMO

## 2022-11-15 ENCOUNTER — Ambulatory Visit: Payer: Medicare HMO | Admitting: Physical Therapy

## 2022-11-19 ENCOUNTER — Ambulatory Visit: Payer: Medicare HMO

## 2022-11-20 ENCOUNTER — Ambulatory Visit: Payer: Medicare HMO | Admitting: Physical Therapy

## 2022-11-21 ENCOUNTER — Ambulatory Visit: Payer: Medicare HMO

## 2022-11-22 ENCOUNTER — Ambulatory Visit: Payer: Medicare HMO

## 2022-11-26 ENCOUNTER — Ambulatory Visit: Payer: Medicare HMO

## 2022-11-27 ENCOUNTER — Ambulatory Visit: Payer: Medicare HMO | Admitting: Physical Therapy

## 2022-11-28 ENCOUNTER — Ambulatory Visit: Payer: Medicare HMO

## 2022-11-29 ENCOUNTER — Ambulatory Visit: Payer: Medicare HMO | Admitting: Physical Therapy

## 2022-12-03 ENCOUNTER — Ambulatory Visit: Payer: Medicare HMO

## 2022-12-04 ENCOUNTER — Ambulatory Visit: Payer: Medicare HMO | Admitting: Physical Therapy

## 2022-12-05 ENCOUNTER — Ambulatory Visit: Payer: Medicare HMO

## 2022-12-06 ENCOUNTER — Ambulatory Visit: Payer: Medicare HMO | Admitting: Physical Therapy

## 2022-12-10 ENCOUNTER — Ambulatory Visit: Payer: Medicare HMO

## 2022-12-11 ENCOUNTER — Ambulatory Visit: Payer: Medicare HMO | Admitting: Physical Therapy

## 2022-12-12 ENCOUNTER — Ambulatory Visit: Payer: Medicare HMO

## 2022-12-13 ENCOUNTER — Ambulatory Visit: Payer: Medicare HMO | Admitting: Physical Therapy

## 2022-12-18 ENCOUNTER — Ambulatory Visit: Payer: Medicare HMO | Admitting: Physical Therapy

## 2022-12-19 ENCOUNTER — Ambulatory Visit: Payer: Medicare HMO

## 2022-12-20 ENCOUNTER — Ambulatory Visit: Payer: Medicare HMO

## 2022-12-24 ENCOUNTER — Ambulatory Visit: Payer: Medicare HMO

## 2022-12-25 ENCOUNTER — Ambulatory Visit: Payer: Medicare HMO | Admitting: Physical Therapy

## 2022-12-26 ENCOUNTER — Ambulatory Visit: Payer: Medicare HMO

## 2022-12-27 ENCOUNTER — Ambulatory Visit: Payer: Medicare HMO | Admitting: Physical Therapy

## 2022-12-31 ENCOUNTER — Ambulatory Visit: Payer: Medicare HMO

## 2023-01-01 ENCOUNTER — Ambulatory Visit: Payer: Medicare HMO | Admitting: Physical Therapy

## 2023-01-01 ENCOUNTER — Other Ambulatory Visit: Payer: Medicare Other

## 2023-01-02 ENCOUNTER — Ambulatory Visit: Payer: Medicare HMO

## 2023-01-03 ENCOUNTER — Ambulatory Visit: Payer: Medicare Other | Admitting: Urology

## 2023-01-03 ENCOUNTER — Ambulatory Visit: Payer: Medicare HMO

## 2023-01-07 ENCOUNTER — Ambulatory Visit: Payer: Medicare HMO

## 2023-01-08 ENCOUNTER — Ambulatory Visit: Payer: Medicare HMO | Admitting: Physical Therapy

## 2023-01-09 ENCOUNTER — Ambulatory Visit: Payer: Medicare HMO

## 2023-01-10 ENCOUNTER — Ambulatory Visit: Payer: Medicare HMO

## 2023-01-14 ENCOUNTER — Ambulatory Visit: Payer: Medicare HMO

## 2023-01-15 ENCOUNTER — Ambulatory Visit: Payer: Medicare HMO | Admitting: Physical Therapy

## 2023-01-16 ENCOUNTER — Ambulatory Visit: Payer: Medicare HMO

## 2023-01-17 ENCOUNTER — Ambulatory Visit: Payer: Medicare HMO

## 2023-01-21 ENCOUNTER — Ambulatory Visit: Payer: Medicare HMO

## 2023-01-22 ENCOUNTER — Ambulatory Visit: Payer: Medicare HMO | Admitting: Physical Therapy

## 2023-01-23 ENCOUNTER — Ambulatory Visit: Payer: Medicare HMO

## 2023-01-28 ENCOUNTER — Ambulatory Visit: Payer: Medicare HMO

## 2023-01-29 ENCOUNTER — Ambulatory Visit: Payer: Medicare HMO | Admitting: Physical Therapy

## 2023-01-30 ENCOUNTER — Ambulatory Visit: Payer: Medicare HMO

## 2023-01-31 ENCOUNTER — Ambulatory Visit: Payer: Medicare HMO | Admitting: Physical Therapy

## 2023-02-04 ENCOUNTER — Ambulatory Visit: Payer: Medicare HMO

## 2023-02-05 ENCOUNTER — Ambulatory Visit: Payer: Medicare HMO | Admitting: Physical Therapy

## 2023-02-06 ENCOUNTER — Ambulatory Visit: Payer: Medicare HMO

## 2023-02-07 ENCOUNTER — Ambulatory Visit: Payer: Medicare HMO | Admitting: Physical Therapy

## 2023-02-26 ENCOUNTER — Ambulatory Visit: Payer: Medicare HMO | Attending: Family Medicine

## 2023-02-26 DIAGNOSIS — R42 Dizziness and giddiness: Secondary | ICD-10-CM | POA: Insufficient documentation

## 2023-02-26 DIAGNOSIS — R2681 Unsteadiness on feet: Secondary | ICD-10-CM | POA: Insufficient documentation

## 2023-02-28 ENCOUNTER — Ambulatory Visit: Payer: Medicare HMO

## 2023-02-28 DIAGNOSIS — R42 Dizziness and giddiness: Secondary | ICD-10-CM

## 2023-02-28 DIAGNOSIS — R2681 Unsteadiness on feet: Secondary | ICD-10-CM | POA: Diagnosis present

## 2023-02-28 NOTE — Therapy (Signed)
OUTPATIENT PHYSICAL THERAPY VESTIBULAR EVALUATION     Patient Name: Scott Spence MRN: [id number] DOB:Feb 05, 1951, 72 y.o., male Today's Date: 02/28/2023  END OF SESSION:  PT End of Session - 02/28/23 0759     Visit Number 1    Number of Visits 9    Date for PT Re-Evaluation 04/26/23    Authorization Type [employer]    Progress Note Due on Visit 10    PT Start Time 0800    PT Stop Time 0845    PT Time Calculation (min) 45 min    Equipment Utilized During Treatment Gait belt    Activity Tolerance Patient tolerated treatment well    Behavior During Therapy WFL for tasks assessed/performed             Past Medical History:  Diagnosis Date   BPH with urinary obstruction    Chronic kidney disease    hemorrhagic nephritis as a child   Complication of anesthesia    collapsed lung with local for LUE surgery   GERD (gastroesophageal reflux disease)    Gout    History of kidney stones    Hypertension    Stroke [facility])    Past Surgical History:  Procedure Laterality Date   APPENDECTOMY     FOOT ARTHRODESIS Right 03/29/2022   Procedure: SUBTALAR ARTHRODESIS;  Surgeon: Toni Arthurs, MD;  Location: [facility];  Service: Orthopedics;  Laterality: Right;   HERNIA REPAIR     HOLEP-LASER ENUCLEATION OF THE PROSTATE WITH MORCELLATION N/A 03/09/2022   Procedure: HOLEP-LASER ENUCLEATION OF THE PROSTATE WITH MORCELLATION;  Surgeon: Sondra Come, MD;  Location: [facility];  Service: Urology;  Laterality: N/A;   KIDNEY STONE SURGERY     PROSTATECTOMY     TOTAL HIP ARTHROPLASTY Right 04/01/2018   Procedure: TOTAL HIP ARTHROPLASTY ANTERIOR APPROACH;  Surgeon: Kennedy Bucker, MD;  Location: [facility];  Service: Orthopedics;  Laterality: Right;   Patient Active Problem List   Diagnosis Date Noted   Carotid artery stenosis 08/28/2022   Vertigo, peripheral 08/07/2022   Hematuria 05/07/2022   Gross hematuria 05/06/2022   Acute urinary retention 05/06/2022   BPH S/P HoLEP  prostatectomy 02/2022 05/06/2022   Status post total hip replacement, right 04/01/2018   Primary osteoarthritis of right hip 01/15/2018   Serrated adenoma of colon 12/30/2014   Anemia, iron deficiency 07/06/2013   Benign prostatic hyperplasia 07/06/2013   Bilateral sensorineural hearing loss 06/29/2010   Tinnitus 06/20/2010   Gouty arthropathy 05/25/2010   Esophageal reflux 12/27/2001   Essential hypertension 12/27/2001    PCP: Jerl Mina, MD REFERRING PROVIDER: Jerl Mina, MD  REFERRING DIAG: R42 (ICD-10-CM) - Dizziness and giddiness  THERAPY DIAG:  Dizziness and giddiness  ONSET DATE: Referral Date:   Rationale for Evaluation and Treatment: Rehabilitation  SUBJECTIVE:   SUBJECTIVE STATEMENT: Patient was in the hospital in January. Saw Dr. Malvin Johns yesterday, reports that he thought he had benign positional vertigo. Reports he feels lightheaded, mainly noticed in the morning. Feels in when he gets up out of the bed. Denies spinning. Denies nausea/vomiting. Reports that the eyes are very sensitive. Has not seen an eye doctor since all this started. Denies falls. Reports has tried meclizine, but reports it made the dizziness worse.  Pt accompanied by: self  PERTINENT HISTORY: CKD, GERD, HTN, Stroke.   PAIN:  Are you having pain? No  Vitals: BP: 142/93 , HR: 73  PRECAUTIONS: None  RED FLAGS: None   WEIGHT BEARING RESTRICTIONS: No  FALLS: Has patient fallen in last 6 months? No  LIVING ENVIRONMENT: Lives with: lives with their family Lives in: House/apartment Has following equipment at home:  Walking Stick; Reports 2-3 miles/morning   PLOF: Independent  PATIENT GOALS: improve lightheadedness sensation  OBJECTIVE:   DIAGNOSTIC FINDINGS: MRI on 1/16: IMPRESSION: No evidence of acute intracranial abnormality.   Moderate chronic small-vessel ischemic changes within the cerebral white matter. To a lesser extent, chronic small vessel ischemic changes are  also present within the bilateral basal ganglia.   CTA head:   No emergent large vessel occlusion or proximal hemodynamically significant stenosis.   CTA neck:   1. 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 2. 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 Right vertebral artery is non dominant. 3. 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  COGNITION: Overall cognitive status: Within functional limits for tasks assessed  POSTURE:  No Significant postural limitations  Cervical ROM:  WFL  BED MOBILITY:  Sit to supine Complete Independence Supine to sit Complete Independence Rolling to Right Complete Independence Rolling to Left Complete Independence  TRANSFERS: Assistive device utilized: None  Sit to stand: Modified independence Stand to sit: Modified independence   GAIT: Gait pattern: WFL Distance walked: clinic distance Assistive device utilized: None Level of assistance: Complete Independence Comments: no unsteadiness noted with ambulation  PATIENT SURVEYS:  FOTO DPS: 56.5 and DFS: 52   VESTIBULAR ASSESSMENT:   SYMPTOM BEHAVIOR:  Subjective history: See Subjective above.   Non-Vestibular symptoms:  Light Sensitivity to Sun/   Type of dizziness: Lightheadedness/Faint  Frequency: Daily  Duration: Hours  Aggravating factors: Worse in the morning and with bright lights (sun/fluorescent lights)   Relieving factors: dark room  Progression of symptoms: unchanged  OCULOMOTOR EXAM:  Ocular Alignment: normal  Ocular ROM: No Limitations  Spontaneous Nystagmus: absent  Gaze-Induced Nystagmus: left beating with left gaze  Smooth Pursuits: saccades  Saccades: hypometric/undershoots and slower to the R > L  Convergence/Divergence: 4 cm   VESTIBULAR - OCULAR REFLEX:   Slow VOR: Normal  VOR Cancellation: Normal  Head-Impulse Test: HIT Right: negative HIT Left: positive  Dynamic Visual Acuity: Static: 7 Dynamic: 3; blurry vision  reported   POSITIONAL TESTING: Right Roll Test: no nystagmus Left Roll Test: no nystagmus Right Sidelying: no nystagmus Left Sidelying: no nystagmus; Patient very sensitive to light in room, having to dim lights to tolerate.   MOTION SENSITIVITY:  Motion Sensitivity Quotient Intensity: 0 = none, 1 = Lightheaded, 2 = Mild, 3 = Moderate, 4 = Severe, 5 = Vomiting  Intensity  1. Sitting to supine 0  2. Supine to L side 1  3. Supine to R side 1  4. Supine to sitting 1  5. L Hallpike-Dix 0  6. Up from L  1  7. R Hallpike-Dix 0  8. Up from R  1  9. Sitting, head tipped to L knee 0  10. Head up from L knee 0  11. Sitting, head tipped to R knee 0  12. Head up from R knee 0  13. Sitting head turns x5 1  14.Sitting head nods x5 3  15. In stance, 180 turn to L  0  16. In stance, 180 turn to R 0    OTHOSTATICS: not done    PATIENT EDUCATION: Education details: Educated on [employer]; VOR Training, Negative BPPV Testing; Follow up with Eye Doctor (potential glasses for light sensitivity)  Person educated: Patient Education method: Explanation Education comprehension:  verbalized understanding  HOME EXERCISE PROGRAM:  GOALS: Goals reviewed with patient? Yes  SHORT TERM GOALS: Target date: 03/29/2023  Pt will be independent with initial HEP for improved vestibular input/VOR  Baseline: no HEP established Goal status: INITIAL  2.  Pt will improve DVA to </= 2 line difference to indicate improved VOR Baseline: 4 line difference Goal status: INITIAL    LONG TERM GOALS: Target date: 04/26/2023  Pt will be independent with final HEP for improved vestibular input/VOR  Baseline: no HEP established Goal status: INITIAL  2.  Pt will improve DVA to </= 3 line difference to indicate improved VOR Baseline: 4 line difference Goal status: INITIAL  3.  Pt will improve DFS to >/= 57 Baseline: 52 Goal status: INITIAL  4.  Pt will report </= 1/5 for all movements on MSQ to  indicate improvement in motion sensitivity and improved activity tolerance.  Baseline: 3/5 for vertical head turns Goal status: INITIAL  ASSESSMENT:  CLINICAL IMPRESSION: Patient is a 72 y.o. male referred to OPPT services for Dizziness. Patient's PMH significant for the following: CKD, GERD, HTN. Upon evaluation, patient presents with the following impairments: impaired oculomotor exam, positive HIT on L side, and 4 line difference on DVA indicating impaired VOR. Patient with mild motion sensitivity to vertical head movement, and significant light sensitivity limiting patients tolerance for upward gaze. Negative positional testing indicating no BPPV at this time. Patient will benefit from skilled PT services to address impairments (see note for additional details), and improve symptoms to promote improved tolerance for functional activities.    OBJECTIVE IMPAIRMENTS: decreased balance and dizziness.   ACTIVITY LIMITATIONS: transfers and locomotion level  PARTICIPATION LIMITATIONS: driving, community activity, and yard work  PERSONAL FACTORS: Age, Time since onset of injury/illness/exacerbation, and 3+ comorbidities: CKD, GERD, HTN, foot arthrodesis  are also affecting patient's functional outcome.   REHAB POTENTIAL: Good  CLINICAL DECISION MAKING: Stable/uncomplicated  EVALUATION COMPLEXITY: Low   PLAN:  PT FREQUENCY: 1x/week  PT DURATION: 8 weeks  PLANNED INTERVENTIONS: Therapeutic exercises, Therapeutic activity, Neuromuscular re-education, Balance training, Gait training, Patient/Family education, Self Care, Joint mobilization, Stair training, Vestibular training, Canalith repositioning, DME instructions, and Manual therapy  PLAN FOR NEXT SESSION: Established HEP focused on Smooth Pursuits and Gaze Stabilization to patient's tolerance. Trial light sensitivity glasses?    Creed Copper Fairly, PT, DPT 02/28/23 8:56 AM

## 2023-03-05 ENCOUNTER — Ambulatory Visit: Payer: Medicare HMO | Admitting: Vascular Surgery

## 2023-03-05 ENCOUNTER — Encounter: Payer: Self-pay | Admitting: Vascular Surgery

## 2023-03-05 ENCOUNTER — Ambulatory Visit (HOSPITAL_COMMUNITY)
Admission: RE | Admit: 2023-03-05 | Discharge: 2023-03-05 | Disposition: A | Discharge status: Home / Self Care | Payer: Medicare HMO | Source: Ambulatory Visit | Attending: Vascular Surgery | Admitting: Vascular Surgery

## 2023-03-05 VITALS — BP 137/89 | HR 81 | Temp 98.1°F | Resp 18 | Ht 66.0 in | Wt 190.0 lb

## 2023-03-05 DIAGNOSIS — I6521 Occlusion and stenosis of right carotid artery: Secondary | ICD-10-CM | POA: Diagnosis present

## 2023-03-05 NOTE — Progress Notes (Signed)
Patient name: Scott Spence MRN: [id number] DOB: [birth year] Sex: male  REASON FOR CONSULT: 6 month follow-up, surveillance carotid artery disease   HPI: Scott Spence is a 72 y.o. male, history of hypertension, BPH, stage III CKD that presents for 6 month follow-up of his carotid artery disease.  Patient previously presented to the ED with acute dizziness and vertigo.  He underwent MRI brain on 08/07/2022 that did not show any acute abnormality.  There was question of a possible brainstem stroke by neurology although this was not shown on the MRI.  CTA neck during the same admission showed a 70% right ICA stenosis with some vertebral artery disease.  He has seen a neurologist at [facility] who did not feel he had a stroke and feels this is benign positional vertigo.    Ultimately patient states he is still having episodes of dizziness - worse with bright light.  He saw his neurologist at [facility] last week who still feels this is benign positional vertigo.  He's had no other focal neurologic symptoms.  He is going to start physical therapy for his dizziness as well.  Past Medical History:  Diagnosis Date   BPH with urinary obstruction    Chronic kidney disease    hemorrhagic nephritis as a child   Complication of anesthesia    collapsed lung with local for LUE surgery   GERD (gastroesophageal reflux disease)    Gout    History of kidney stones    Hypertension    Stroke [facility])     Past Surgical History:  Procedure Laterality Date   APPENDECTOMY     FOOT ARTHRODESIS Right 03/29/2022   Procedure: SUBTALAR ARTHRODESIS;  Surgeon: Toni Arthurs, MD;  Location: [facility];  Service: Orthopedics;  Laterality: Right;   HERNIA REPAIR     HOLEP-LASER ENUCLEATION OF THE PROSTATE WITH MORCELLATION N/A 03/09/2022   Procedure: HOLEP-LASER ENUCLEATION OF THE PROSTATE WITH MORCELLATION;  Surgeon: Sondra Come, MD;  Location: [facility];  Service: Urology;  Laterality: N/A;   KIDNEY STONE SURGERY      PROSTATECTOMY     TOTAL HIP ARTHROPLASTY Right 04/01/2018   Procedure: TOTAL HIP ARTHROPLASTY ANTERIOR APPROACH;  Surgeon: Kennedy Bucker, MD;  Location: [facility];  Service: Orthopedics;  Laterality: Right;    Family History  Problem Relation Age of Onset   Heart attack Father    Prostate cancer Neg Hx    Bladder Cancer Neg Hx    Kidney cancer Neg Hx     SOCIAL HISTORY: Social History   Socioeconomic History   Marital status: Married    Spouse name: Not on file   Number of children: 2   Years of education: Not on file   Highest education level: Not on file  Occupational History   Occupation: Retired - Transport [employer]  Tobacco Use   Smoking status: Never    Passive exposure: Never   Smokeless tobacco: Never  Vaping Use   Vaping status: Never Used  Substance and Sexual Activity   Alcohol use: Yes    Alcohol/week: 14.0 standard drinks of alcohol    Types: 14 Glasses of wine per week    Comment: 3-4 glasses a day   Drug use: No   Sexual activity: Yes    Birth control/protection: None  Other Topics Concern   Not on file  Social History Narrative   Not on file   Social Determinants of Health   Financial Resource Strain: Patient Declined (12/03/2022)  Received from [facility]   Overall Financial Resource Strain (CARDIA)    Difficulty of Paying Living Expenses: Patient declined  Food Insecurity: Patient Declined (12/03/2022)   Received from [facility]   Hunger Vital Sign    Worried About Running Out of Food in the Last Year: Patient declined    Ran Out of Food in the Last Year: Patient declined  Transportation Needs: Patient Declined (12/03/2022)   Received from [facility]    In the past 12 months, has lack of transportation kept you from medical appointments or from getting medications?: Patient declined    Lack of Transportation (Non-Medical): Patient declined  Physical  Activity: Not on file  Stress: Not on file  Social Connections: Not on file  Intimate Partner Violence: Not At Risk (08/07/2022)   Humiliation, Afraid, Rape, and Kick questionnaire    Fear of Current or Ex-Partner: No    Emotionally Abused: No    Physically Abused: No    Sexually Abused: No    Allergies  Allergen Reactions   Meperidine Other (See Comments)    GI Upset   Sulfa Antibiotics Hives   Gabapentin Other (See Comments)    Migraine HA   Lisinopril Cough    Current Outpatient Medications  Medication Sig Dispense Refill   allopurinol (ZYLOPRIM) 300 MG tablet Take 300 mg by mouth daily.      amLODipine (NORVASC) 5 MG tablet Take 1 tablet (5 mg total) by mouth daily. 90 tablet 3   aspirin EC 81 MG tablet Take 1 tablet (81 mg total) by mouth daily. 30 tablet 12   losartan (COZAAR) 100 MG tablet Take 100 mg by mouth daily.      omeprazole (PRILOSEC) 20 MG capsule Take 20 mg by mouth daily.      rosuvastatin (CRESTOR) 10 MG tablet Take 1 tablet (10 mg total) by mouth daily. 90 tablet 3   No current facility-administered medications for this visit.    REVIEW OF SYSTEMS:  X denotes positive finding, denotes negative finding Cardiac  Comments:  Chest pain or chest pressure:    Shortness of breath upon exertion:    Short of breath when lying flat:    Irregular heart rhythm:        Vascular    Pain in calf, thigh, or hip brought on by ambulation:    Pain in feet at night that wakes you up from your sleep:     Blood clot in your veins:    Leg swelling:         Pulmonary    Oxygen at home:    Productive cough:     Wheezing:         Neurologic    Sudden weakness in arms or legs:     Sudden numbness in arms or legs:     Sudden onset of difficulty speaking or slurred speech:    Temporary loss of vision in one eye:     Problems with dizziness:         Gastrointestinal    Blood in stool:     Vomited blood:         Genitourinary    Burning when urinating:     Blood  in urine:        Psychiatric    Major depression:         Hematologic    Bleeding problems:    Problems with blood  clotting too easily:        Skin    Rashes or ulcers:        Constitutional    Fever or chills:      PHYSICAL EXAM: Vitals:   03/05/23 1424 03/05/23 1425  BP: (!) 137/92 137/89  Pulse: 81   Resp: 18   Temp: 98.1 F (36.7 C)   TempSrc: Temporal   SpO2: 96%   Weight: 190 lb (86.2 kg)   Height: 5\' 6"  (1.676 m)     GENERAL: The patient is a well-nourished male, in no acute distress. The vital signs are documented above. CARDIAC: There is a regular rate and rhythm.  VASCULAR:  Bilateral radial pulses palpable PULMONARY: No respiratory distress. ABDOMEN: Soft and non-tender. MUSCULOSKELETAL: There are no major deformities or cyanosis. NEUROLOGIC: No focal weakness or paresthesias are detected.  Cranial nerves II through XII grossly intact. SKIN: There are no ulcers or rashes noted. PSYCHIATRIC: The patient has a normal affect.  DATA:   Carotid duplex today 40-59% right and 1-39% left ICA stenosis by velocity criteria   CTA neck 08/07/2022: approximately 70% stenosis of the proximal right ICA.  Severe right and mild left vertebral artery stenosis and the right vert is nondominant.  Assessment/Plan:  72 year old male presents for month follow-up of his carotid artery disease.  Previously seen in the ED earlier this year in January with dizziness that has subsequently been evaluated by neurology at [facility] felt to be benign positional vertigo.  He had a CT at that time showing approximate 70% right ICA stenosis.  Duplex today shows a 40 to 59% stenosis by velocity criteria.  I discussed with the patient that I do not think his carotid disease has improved but ultimately that shows that he has had no significant progression to require surgery.  Again discussed greater than 80% stenosis is the indication for surgery for carotid disease in the setting of asymptomatic  carotid disease.  He has antegrade flow in both vertebral arteries.  I will see him in 6 months with another carotid duplex.  Cephus Shelling, MD [facility] Office: [phone]

## 2023-03-07 ENCOUNTER — Ambulatory Visit: Payer: Medicare HMO

## 2023-03-07 DIAGNOSIS — R2681 Unsteadiness on feet: Secondary | ICD-10-CM

## 2023-03-07 DIAGNOSIS — R42 Dizziness and giddiness: Secondary | ICD-10-CM

## 2023-03-07 NOTE — Therapy (Signed)
OUTPATIENT PHYSICAL THERAPY VESTIBULAR TREATMENT NOTE   Patient Name: Scott Spence MRN: [id number] DOB:1950/09/03, 72 y.o., male Today's Date: 03/07/2023  PCP: Jerl Mina, MD  REFERRING PROVIDER: Jerl Mina, MD   END OF SESSION:  PT End of Session - 03/07/23 0758     Visit Number 2    Number of Visits 9    Date for PT Re-Evaluation 04/26/23    Authorization Type [employer] Medicare    Progress Note Due on Visit 10    PT Start Time 0800    PT Stop Time 0842    PT Time Calculation (min) 42 min    Equipment Utilized During Treatment Gait belt    Activity Tolerance Patient tolerated treatment well    Behavior During Therapy WFL for tasks assessed/performed             Past Medical History:  Diagnosis Date   BPH with urinary obstruction    Chronic kidney disease    hemorrhagic nephritis as a child   Complication of anesthesia    collapsed lung with local for LUE surgery   GERD (gastroesophageal reflux disease)    Gout    History of kidney stones    Hypertension    Stroke [facility])    Past Surgical History:  Procedure Laterality Date   APPENDECTOMY     FOOT ARTHRODESIS Right 03/29/2022   Procedure: SUBTALAR ARTHRODESIS;  Surgeon: Toni Arthurs, MD;  Location: [facility];  Service: Orthopedics;  Laterality: Right;   HERNIA REPAIR     HOLEP-LASER ENUCLEATION OF THE PROSTATE WITH MORCELLATION N/A 03/09/2022   Procedure: HOLEP-LASER ENUCLEATION OF THE PROSTATE WITH MORCELLATION;  Surgeon: Sondra Come, MD;  Location: [facility];  Service: Urology;  Laterality: N/A;   KIDNEY STONE SURGERY     PROSTATECTOMY     TOTAL HIP ARTHROPLASTY Right 04/01/2018   Procedure: TOTAL HIP ARTHROPLASTY ANTERIOR APPROACH;  Surgeon: Kennedy Bucker, MD;  Location: [facility];  Service: Orthopedics;  Laterality: Right;   Patient Active Problem List   Diagnosis Date Noted   Carotid artery stenosis 08/28/2022   Vertigo, peripheral 08/07/2022   Hematuria 05/07/2022   Gross  hematuria 05/06/2022   Acute urinary retention 05/06/2022   BPH S/P HoLEP prostatectomy 02/2022 05/06/2022   Status post total hip replacement, right 04/01/2018   Primary osteoarthritis of right hip 01/15/2018   Serrated adenoma of colon 12/30/2014   Anemia, iron deficiency 07/06/2013   Benign prostatic hyperplasia 07/06/2013   Bilateral sensorineural hearing loss 06/29/2010   Tinnitus 06/20/2010   Gouty arthropathy 05/25/2010   Esophageal reflux 12/27/2001   Essential hypertension 12/27/2001    ONSET DATE: Referral Date: 02/05/2023  REFERRING DIAG: R42 (ICD-10-CM) - Dizziness and giddiness   THERAPY DIAG:  Dizziness and giddiness  Unsteadiness on feet  Rationale for Evaluation and Treatment: Rehabilitation  PERTINENT HISTORY: CKD, GERD, HTN  PRECAUTIONS: None  SUBJECTIVE: Reports reports the Optometrist said he had dry eyes. Dizziness about the same. Still feels the dizziness most in bright light and ambulating in the sun. No falls.   PAIN:  Are you having pain? No   OBJECTIVE:   VESTIBULAR TREATMENT:    Gaze Adaptation: x1 Viewing Horizontal: Position: Seated, Time: 30 seconds, and Reps: 2 reps and x1 Viewing Vertical:  Position: seated, Time: 30 seconds, and Reps: 2 reps Lights dimmed with completion due to light sensitivity, patient reports increased dizziness/lightheadedness with vertical, specifically upward direction.    Other: Saccades: Horizontal Saccades: Completed x 8 reps with  moderate dizziness. Completed x 2 trials. Then progressed to Vertical Saccades: Completed x 10 reps with moderate dizziness. Completed x 2 trials. Educated on symptom monitoring and progressing on a symptom based approach.   Habituation: Seated Horizontal Head Movements: Completed with eyes closed x 5 reps; then eyes open x 5 reps. Patient more symptomatic with eyes open > eyes closed. Seated Vertical Head Movements: Completed with eyes closed x 5 reps; then eyes open x 5 reps. Patient  more symptomatic with eyes open > eyes closed.   Provided following HEP:    Oculomotor: Saccades    Holding two targets positioned side by side 4 inches apart, move eyes quickly from target to target as head stays still. Complete 10 reps each direction. Do not complete diagonal direction at this time. Perform sitting.Repeat 2 times per session. Do 2 sessions per day.   Head Turns/Nods:    Seated with eyes open, in dim lighting. Completed full head turns to right and left, complete 5 reps. If symptoms are provoked, let them resolve before going to head nods. Completed full head nods up and down, complete 5 reps. Do 2 sessions per day.    Gaze Stabilization: Sitting    Keeping eyes on target on wall 3-4 feet away, tilt head down 15-30 and move head side to side for 30 seconds. Repeat while moving head up and down for 30 seconds. Do 2 sessions per day.    PATIENT EDUCATION: Education details: Initial HEP Person educated: Patient Education method: Explanation Education comprehension: verbalized understanding   HOME EXERCISE PROGRAM: See Above for Details    GOALS: Goals reviewed with patient? Yes   SHORT TERM GOALS: Target date: 03/29/2023   Pt will be independent with initial HEP for improved vestibular input/VOR  Baseline: no HEP established Goal status: INITIAL   2.  Pt will improve DVA to </= 2 line difference to indicate improved VOR Baseline: 4 line difference Goal status: INITIAL       LONG TERM GOALS: Target date: 04/26/2023   Pt will be independent with final HEP for improved vestibular input/VOR  Baseline: no HEP established Goal status: INITIAL   2.  Pt will improve DVA to </= 3 line difference to indicate improved VOR Baseline: 4 line difference Goal status: INITIAL   3.  Pt will improve DFS to >/= 57 Baseline: 52 Goal status: INITIAL   4.  Pt will report </= 1/5 for all movements on MSQ to indicate improvement in motion sensitivity and improved  activity tolerance.  Baseline: 3/5 for vertical head turns Goal status: INITIAL   ASSESSMENT:   CLINICAL IMPRESSION: Today's skilled PT session focused on establishing initial HEP focused on oculomotor saccades, gaze stabilization, and habituation to horizontal/vertical head movements. Patient with increased dizziness and sensitivity to vertical > horizontal head movements. Continue to require room with dim lighting due to light sensitivity. Will continue to progress toward all LTGs.    OBJECTIVE IMPAIRMENTS: decreased balance and dizziness.    ACTIVITY LIMITATIONS: transfers and locomotion level   PARTICIPATION LIMITATIONS: driving, community activity, and yard work   PERSONAL FACTORS: Age, Time since onset of injury/illness/exacerbation, and 3+ comorbidities: CKD, GERD, HTN, foot arthrodesis  are also affecting patient's functional outcome.    REHAB POTENTIAL: Good   CLINICAL DECISION MAKING: Stable/uncomplicated   EVALUATION COMPLEXITY: Low     PLAN:   PT FREQUENCY: 1x/week   PT DURATION: 8 weeks   PLANNED INTERVENTIONS: Therapeutic exercises, Therapeutic activity, Neuromuscular re-education, Balance training, Gait  training, Patient/Family education, Self Care, Joint mobilization, Stair training, Vestibular training, Canalith repositioning, DME instructions, and Manual therapy   PLAN FOR NEXT SESSION: Trial light sensitivity glasses? Progress gaze stabilization, saccades, visual tracking with activity, head turns/nods.      Creed Copper Fairly, PT, DPT 03/07/23 8:45 AM

## 2023-03-08 ENCOUNTER — Other Ambulatory Visit: Payer: Self-pay

## 2023-03-08 DIAGNOSIS — I6521 Occlusion and stenosis of right carotid artery: Secondary | ICD-10-CM

## 2023-03-28 ENCOUNTER — Ambulatory Visit: Payer: Medicare HMO | Attending: Family Medicine

## 2023-03-28 DIAGNOSIS — R262 Difficulty in walking, not elsewhere classified: Secondary | ICD-10-CM | POA: Insufficient documentation

## 2023-03-28 DIAGNOSIS — R269 Unspecified abnormalities of gait and mobility: Secondary | ICD-10-CM | POA: Diagnosis present

## 2023-03-28 DIAGNOSIS — R42 Dizziness and giddiness: Secondary | ICD-10-CM | POA: Diagnosis present

## 2023-03-28 DIAGNOSIS — R2681 Unsteadiness on feet: Secondary | ICD-10-CM | POA: Insufficient documentation

## 2023-03-28 NOTE — Therapy (Signed)
OUTPATIENT PHYSICAL THERAPY VESTIBULAR TREATMENT NOTE   Patient Name: Scott Spence MRN: [id number] DOB:1951/06/16, 72 y.o., male Today's Date: 03/28/2023  PCP: Jerl Mina, MD  REFERRING PROVIDER: Jerl Mina, MD   END OF SESSION:  PT End of Session - 03/28/23 0849     Visit Number 3    Number of Visits 9    Date for PT Re-Evaluation 04/26/23    Authorization Type [employer] Medicare    Progress Note Due on Visit 10    PT Start Time 0848    PT Stop Time 0929    PT Time Calculation (min) 41 min    Equipment Utilized During Treatment Gait belt    Activity Tolerance Patient tolerated treatment well    Behavior During Therapy WFL for tasks assessed/performed             Past Medical History:  Diagnosis Date   BPH with urinary obstruction    Chronic kidney disease    hemorrhagic nephritis as a child   Complication of anesthesia    collapsed lung with local for LUE surgery   GERD (gastroesophageal reflux disease)    Gout    History of kidney stones    Hypertension    Stroke [facility])    Past Surgical History:  Procedure Laterality Date   APPENDECTOMY     FOOT ARTHRODESIS Right 03/29/2022   Procedure: SUBTALAR ARTHRODESIS;  Surgeon: Toni Arthurs, MD;  Location: [facility];  Service: Orthopedics;  Laterality: Right;   HERNIA REPAIR     HOLEP-LASER ENUCLEATION OF THE PROSTATE WITH MORCELLATION N/A 03/09/2022   Procedure: HOLEP-LASER ENUCLEATION OF THE PROSTATE WITH MORCELLATION;  Surgeon: Sondra Come, MD;  Location: [facility];  Service: Urology;  Laterality: N/A;   KIDNEY STONE SURGERY     PROSTATECTOMY     TOTAL HIP ARTHROPLASTY Right 04/01/2018   Procedure: TOTAL HIP ARTHROPLASTY ANTERIOR APPROACH;  Surgeon: Kennedy Bucker, MD;  Location: [facility];  Service: Orthopedics;  Laterality: Right;   Patient Active Problem List   Diagnosis Date Noted   Carotid artery stenosis 08/28/2022   Vertigo, peripheral 08/07/2022   Hematuria 05/07/2022   Gross  hematuria 05/06/2022   Acute urinary retention 05/06/2022   BPH S/P HoLEP prostatectomy 02/2022 05/06/2022   Status post total hip replacement, right 04/01/2018   Primary osteoarthritis of right hip 01/15/2018   Serrated adenoma of colon 12/30/2014   Anemia, iron deficiency 07/06/2013   Benign prostatic hyperplasia 07/06/2013   Bilateral sensorineural hearing loss 06/29/2010   Tinnitus 06/20/2010   Gouty arthropathy 05/25/2010   Esophageal reflux 12/27/2001   Essential hypertension 12/27/2001    ONSET DATE: Referral Date: 02/05/2023  REFERRING DIAG: R42 (ICD-10-CM) - Dizziness and giddiness   THERAPY DIAG:  Dizziness and giddiness  Unsteadiness on feet  Rationale for Evaluation and Treatment: Rehabilitation  PERTINENT HISTORY: CKD, GERD, HTN  PRECAUTIONS: None  SUBJECTIVE: Reports that neurologist gave him Nortriptyline. Reports seems to be getting better. Reports some double vision with the Gaze Exercises.  PAIN:  Are you having pain? No   OBJECTIVE:   VESTIBULAR TREATMENT:    Gaze Adaptation: x1 Viewing Horizontal: Position: Seated, Time: 30 seconds, and Reps: 2 reps and x1 Viewing Vertical:  Position: seated, Time: 30 seconds, and Reps: 2 reps . Trialed, patient asymptomatic but reports double vision with completion this date, despite pacing, technique changes. Removed from HEP and trialed other activities. No diplopia stationary when not completing VOR.    Saccades: Horizontal Saccades: Completed x  8 reps with no dizziness. Completed x 2 trials. Then progressed to Vertical Saccades: Completed x 10 reps with no dizziness. Completed x 2 trials. Progressed to Diagonal Saccades: Completed x 10 reps with no dizziness. Completed x  2 trials. No dizziness this date. Added diagonal saccades to HEP.   Corrective Saccades: Completed x  10 reps Horizontal Corrective Saccades with patient asymptomatic but increased difficulty with completion. Added to HEP, see below.   Visual  Tracking: Seated with Ball completed visual tracking, include vertical, diagonal to bilateral directions, and CW/CCW circles. Completed x 10 reps each direction. Pt able to maintain eyes focused, and no dizziness reported.   Optokinetic: Completed optokinetic exercises x 60 seconds, patient asymptomatic.   Provided following HEP:   Compensatory Strategies: Corrective Saccades    1. Holding two stationary targets placed 10-12 inches apart, move eyes to target, keep head still. 2. Then move head in direction of target while eyes remain on target. 3. Repeat in opposite direction. Perform sitting. Repeat sequence 10 times per session. Do 2 sessions per day.  PATIENT EDUCATION: Education details: HEP Update Person educated: Patient Education method: Explanation Education comprehension: verbalized understanding   HOME EXERCISE PROGRAM: Corrective Saccades, Saccades, VOR     GOALS: Goals reviewed with patient? Yes   SHORT TERM GOALS: Target date: 03/29/2023   Pt will be independent with initial HEP for improved vestibular input/VOR  Baseline: no HEP established; reports independence with HEP Goal status: MET   2.  Pt will improve DVA to </= 2 line difference to indicate improved VOR Baseline: 4 line difference Goal status: On-Going       LONG TERM GOALS: Target date: 04/26/2023   Pt will be independent with final HEP for improved vestibular input/VOR  Baseline: no HEP established Goal status: INITIAL   2.  Pt will improve DVA to </= 3 line difference to indicate improved VOR Baseline: 4 line difference Goal status: INITIAL   3.  Pt will improve DFS to >/= 57 Baseline: 52 Goal status: INITIAL   4.  Pt will report </= 1/5 for all movements on MSQ to indicate improvement in motion sensitivity and improved activity tolerance.  Baseline: 3/5 for vertical head turns Goal status: INITIAL   ASSESSMENT:   CLINICAL IMPRESSION: Today's skilled PT session included assessment of  patient's progress toward STGs. Patient able to meet STG. Patient reports independence with current HEP, and demo improvements with reduced to no dizziness with oculomotor exercises this date. Patient is making steady progress with therapy and will continue to benefit from skilled PT services to progress toward LTGs.     OBJECTIVE IMPAIRMENTS: decreased balance and dizziness.    ACTIVITY LIMITATIONS: transfers and locomotion level   PARTICIPATION LIMITATIONS: driving, community activity, and yard work   PERSONAL FACTORS: Age, Time since onset of injury/illness/exacerbation, and 3+ comorbidities: CKD, GERD, HTN, foot arthrodesis  are also affecting patient's functional outcome.    REHAB POTENTIAL: Good   CLINICAL DECISION MAKING: Stable/uncomplicated   EVALUATION COMPLEXITY: Low     PLAN:   PT FREQUENCY: 1x/week   PT DURATION: 8 weeks   PLANNED INTERVENTIONS: Therapeutic exercises, Therapeutic activity, Neuromuscular re-education, Balance training, Gait training, Patient/Family education, Self Care, Joint mobilization, Stair training, Vestibular training, Canalith repositioning, DME instructions, and Manual therapy   PLAN FOR NEXT SESSION: Progress gaze stabilization, saccades, visual tracking with activity, head turns/nods.      Howie Ill, PT, DPT 03/28/23 9:47 AM

## 2023-04-03 ENCOUNTER — Ambulatory Visit: Payer: Medicare HMO

## 2023-04-03 DIAGNOSIS — R2681 Unsteadiness on feet: Secondary | ICD-10-CM

## 2023-04-03 DIAGNOSIS — R262 Difficulty in walking, not elsewhere classified: Secondary | ICD-10-CM

## 2023-04-03 DIAGNOSIS — R42 Dizziness and giddiness: Secondary | ICD-10-CM | POA: Diagnosis not present

## 2023-04-03 DIAGNOSIS — R269 Unspecified abnormalities of gait and mobility: Secondary | ICD-10-CM

## 2023-04-03 NOTE — Therapy (Signed)
OUTPATIENT PHYSICAL THERAPY VESTIBULAR TREATMENT    Patient Name: Scott Spence MRN: [id number] DOB:January 13, 1951, 72 y.o., male Today's Date: 04/03/2023  PCP: Jerl Mina, MD  REFERRING PROVIDER: Jerl Mina, MD   END OF SESSION:  PT End of Session - 04/03/23 1150     Visit Number 4    Number of Visits 9    Date for PT Re-Evaluation 04/26/23    Authorization Type [employer]    Progress Note Due on Visit 10    PT Start Time 1145    PT Stop Time 1225    PT Time Calculation (min) 40 min    Equipment Utilized During Treatment Gait belt    Activity Tolerance Patient tolerated treatment well;No increased pain    Behavior During Therapy WFL for tasks assessed/performed             Past Medical History:  Diagnosis Date   BPH with urinary obstruction    Chronic kidney disease    hemorrhagic nephritis as a child   Complication of anesthesia    collapsed lung with local for LUE surgery   GERD (gastroesophageal reflux disease)    Gout    History of kidney stones    Hypertension    Stroke [facility])    Past Surgical History:  Procedure Laterality Date   APPENDECTOMY     FOOT ARTHRODESIS Right 03/29/2022   Procedure: SUBTALAR ARTHRODESIS;  Surgeon: Toni Arthurs, MD;  Location: [facility];  Service: Orthopedics;  Laterality: Right;   HERNIA REPAIR     HOLEP-LASER ENUCLEATION OF THE PROSTATE WITH MORCELLATION N/A 03/09/2022   Procedure: HOLEP-LASER ENUCLEATION OF THE PROSTATE WITH MORCELLATION;  Surgeon: Sondra Come, MD;  Location: [facility];  Service: Urology;  Laterality: N/A;   KIDNEY STONE SURGERY     PROSTATECTOMY     TOTAL HIP ARTHROPLASTY Right 04/01/2018   Procedure: TOTAL HIP ARTHROPLASTY ANTERIOR APPROACH;  Surgeon: Kennedy Bucker, MD;  Location: [facility];  Service: Orthopedics;  Laterality: Right;     ONSET DATE: Referral Date: 02/05/2023  REFERRING DIAG: R42 (ICD-10-CM) - Dizziness and giddiness   THERAPY DIAG:  Dizziness and  giddiness  Unsteadiness on feet  Difficulty in walking, not elsewhere classified  Abnormality of gait and mobility  Rationale for Evaluation and Treatment: Rehabilitation  PERTINENT HISTORY: CKD, GERD, HTN  PRECAUTIONS: None  SUBJECTIVE:  Pt reports his symptoms are improving. HE worked on his new HEP items, still having a lot of double vision. Still following orders from eye doctor and neurologist.    PAIN:  Are you having pain? No   OBJECTIVE:   VESTIBULAR TREATMENT:    -Gaze stabilization with horizontal head turns x 60 sec (constant double vision with both turnings)  -Repeat, but pt asked to hold turn until resolution of diplopia 2x60sec  -standing EC firm surface x 60sec  -Repeat, but pt asked to hold turn until resolution of diplopia 1x60sec -standing EO on foam x 60sec (minGuard Assist!)  -Repeat, but pt asked to hold turn until resolution of diplopia 1x60sec  -Saccade trainingRt/Lt 4x30sec  -rainbo ball toss in darker space, varrying angles with static head and dynamic head.   PATIENT EDUCATION: Education details: HEP Update Person educated: Patient Education method: Explanation Education comprehension: verbalized understanding   HOME EXERCISE PROGRAM: Corrective Saccades, Saccades, VOR   Provided following HEP:   Compensatory Strategies: Corrective Saccades  1. Holding two stationary targets placed 10-12 inches apart, move eyes to target, keep head still. 2. Then move  head in direction of target while eyes remain on target. 3. Repeat in opposite direction. Perform sitting. Repeat sequence 10 times per session. Do 2 sessions per day.   GOALS: Goals reviewed with patient? Yes   SHORT TERM GOALS: Target date: 03/29/2023   Pt will be independent with initial HEP for improved vestibular input/VOR  Baseline: no HEP established; reports independence with HEP Goal status: MET   2.  Pt will improve DVA to </= 2 line difference to indicate improved  VOR Baseline: 4 line difference Goal status: On-Going       LONG TERM GOALS: Target date: 04/26/2023   Pt will be independent with final HEP for improved vestibular input/VOR  Baseline: no HEP established Goal status: INITIAL   2.  Pt will improve DVA to </= 3 line difference to indicate improved VOR Baseline: 4 line difference Goal status: INITIAL   3.  Pt will improve DFS to >/= 57 Baseline: 52 Goal status: INITIAL   4.  Pt will report </= 1/5 for all movements on MSQ to indicate improvement in motion sensitivity and improved activity tolerance.  Baseline: 3/5 for vertical head turns Goal status: INITIAL   ASSESSMENT:   CLINICAL IMPRESSION: Pt continues to make progress. Eye tracking activities can easily provoke diplopia, but improve with focused.  Patient is making steady progress with therapy and will continue to benefit from skilled PT services to progress toward LTGs.     OBJECTIVE IMPAIRMENTS: decreased balance and dizziness.    ACTIVITY LIMITATIONS: transfers and locomotion level   PARTICIPATION LIMITATIONS: driving, community activity, and yard work   PERSONAL FACTORS: Age, Time since onset of injury/illness/exacerbation, and 3+ comorbidities: CKD, GERD, HTN, foot arthrodesis  are also affecting patient's functional outcome.    REHAB POTENTIAL: Good   CLINICAL DECISION MAKING: Stable/uncomplicated   EVALUATION COMPLEXITY: Low     PLAN:   PT FREQUENCY: 1x/week   PT DURATION: 8 weeks   PLANNED INTERVENTIONS: Therapeutic exercises, Therapeutic activity, Neuromuscular re-education, Balance training, Gait training, Patient/Family education, Self Care, Joint mobilization, Stair training, Vestibular training, Canalith repositioning, DME instructions, and Manual therapy   PLAN FOR NEXT SESSION: Progress gaze stabilization, saccades, visual tracking with activity, head turns/nods.      12:10 PM, 04/03/23 Rosamaria Lints, PT, DPT Physical Therapist - [facility]  Outpatient Physical Therapy- Main Campus [phone]     04/03/23 11:55 AM

## 2023-04-04 ENCOUNTER — Ambulatory Visit: Payer: Medicare HMO

## 2023-04-11 ENCOUNTER — Ambulatory Visit: Payer: Medicare HMO

## 2023-04-11 DIAGNOSIS — R42 Dizziness and giddiness: Secondary | ICD-10-CM

## 2023-04-11 DIAGNOSIS — R2681 Unsteadiness on feet: Secondary | ICD-10-CM

## 2023-04-11 NOTE — Therapy (Signed)
OUTPATIENT PHYSICAL THERAPY VESTIBULAR TREATMENT    Patient Name: Scott Spence MRN: [id number] DOB:12/10/1950, 72 y.o., male Today's Date: 04/11/2023  PCP: Jerl Mina, MD  REFERRING PROVIDER: Jerl Mina, MD   END OF SESSION:  PT End of Session - 04/11/23 0847     Visit Number 5    Number of Visits 9    Date for PT Re-Evaluation 04/26/23    Authorization Type [employer]    Progress Note Due on Visit 10    PT Start Time 0847    PT Stop Time 0929    PT Time Calculation (min) 42 min    Equipment Utilized During Treatment Gait belt    Activity Tolerance Patient tolerated treatment well;No increased pain    Behavior During Therapy WFL for tasks assessed/performed             Past Medical History:  Diagnosis Date   BPH with urinary obstruction    Chronic kidney disease    hemorrhagic nephritis as a child   Complication of anesthesia    collapsed lung with local for LUE surgery   GERD (gastroesophageal reflux disease)    Gout    History of kidney stones    Hypertension    Stroke [facility])    Past Surgical History:  Procedure Laterality Date   APPENDECTOMY     FOOT ARTHRODESIS Right 03/29/2022   Procedure: SUBTALAR ARTHRODESIS;  Surgeon: Toni Arthurs, MD;  Location: [facility];  Service: Orthopedics;  Laterality: Right;   HERNIA REPAIR     HOLEP-LASER ENUCLEATION OF THE PROSTATE WITH MORCELLATION N/A 03/09/2022   Procedure: HOLEP-LASER ENUCLEATION OF THE PROSTATE WITH MORCELLATION;  Surgeon: Sondra Come, MD;  Location: [facility];  Service: Urology;  Laterality: N/A;   KIDNEY STONE SURGERY     PROSTATECTOMY     TOTAL HIP ARTHROPLASTY Right 04/01/2018   Procedure: TOTAL HIP ARTHROPLASTY ANTERIOR APPROACH;  Surgeon: Kennedy Bucker, MD;  Location: [facility];  Service: Orthopedics;  Laterality: Right;     ONSET DATE: Referral Date: 02/05/2023  REFERRING DIAG: R42 (ICD-10-CM) - Dizziness and giddiness   THERAPY DIAG:  Dizziness and  giddiness  Unsteadiness on feet  Rationale for Evaluation and Treatment: Rehabilitation  PERTINENT HISTORY: CKD, GERD, HTN  PRECAUTIONS: None  SUBJECTIVE: Patient reports still having double vision. Plans on following up with opthalmology.    PAIN:  Are you having pain? No   OBJECTIVE:   VESTIBULAR TREATMENT:   Gaze Adaptation:   x1 Viewing Horizontal: Position: seated, Time: 30 seconds, Reps: 2, and Comment: cues for slowed pacing and range to assist with reduced diplopia, mild improvement but continue to experience diplopia. Re-added to HEP and x1 Viewing Vertical:  Position: seated, Time: 30 seconds, Reps: 2, and Comment: no dizziness or diplopia reported.   Habituation:   Compensatory Saccades: comment: completed x 10 reps horizontal, with cues for pacing, technique. Mild double vision. Improved with slowed pacing.    Saccades: Completed vertical saccades, x 10 reps. No symptoms.   Extensive education provided on HEP and purpose of exercises. Continue to encourage neuro-opthalmology due to visual symptoms and light sensitivity      Visual Tracking: Completed ambulation with visual tracking, completed pure vertical with ball 2  x 40', then diagonal 2 x 40' with intermittent mild imbalance requiring CGA. Progressed to self ball toss with visual tracking 2 x 40'. Mild - moderate dizziness/imbalance reported.    PATIENT EDUCATION: Education details: Continue HEP  Person educated: Patient Education  method: Explanation Education comprehension: verbalized understanding   HOME EXERCISE PROGRAM: Corrective Saccades, Saccades, VOR   GOALS: Goals reviewed with patient? Yes   SHORT TERM GOALS: Target date: 03/29/2023   Pt will be independent with initial HEP for improved vestibular input/VOR  Baseline: no HEP established; reports independence with HEP Goal status: MET   2.  Pt will improve DVA to </= 2 line difference to indicate improved VOR Baseline: 4 line difference Goal  status: On-Going       LONG TERM GOALS: Target date: 04/26/2023   Pt will be independent with final HEP for improved vestibular input/VOR  Baseline: no HEP established Goal status: INITIAL   2.  Pt will improve DVA to </= 3 line difference to indicate improved VOR Baseline: 4 line difference Goal status: INITIAL   3.  Pt will improve DFS to >/= 57 Baseline: 52 Goal status: INITIAL   4.  Pt will report </= 1/5 for all movements on MSQ to indicate improvement in motion sensitivity and improved activity tolerance.  Baseline: 3/5 for vertical head turns Goal status: INITIAL   ASSESSMENT:   CLINICAL IMPRESSION: Patient continues to demo slow progress with visual activities, as continue to have intermittent diplopia and light sensitivity. Was able to re-trial VOR with slowed pace with some improvements in diplopia. Will continue to progress as tolerated. Mild dizziness with visual tracking this date.     OBJECTIVE IMPAIRMENTS: decreased balance and dizziness.    ACTIVITY LIMITATIONS: transfers and locomotion level   PARTICIPATION LIMITATIONS: driving, community activity, and yard work   PERSONAL FACTORS: Age, Time since onset of injury/illness/exacerbation, and 3+ comorbidities: CKD, GERD, HTN, foot arthrodesis  are also affecting patient's functional outcome.    REHAB POTENTIAL: Good   CLINICAL DECISION MAKING: Stable/uncomplicated   EVALUATION COMPLEXITY: Low     PLAN:   PT FREQUENCY: 1x/week   PT DURATION: 8 weeks   PLANNED INTERVENTIONS: Therapeutic exercises, Therapeutic activity, Neuromuscular re-education, Balance training, Gait training, Patient/Family education, Self Care, Joint mobilization, Stair training, Vestibular training, Canalith repositioning, DME instructions, and Manual therapy   PLAN FOR NEXT SESSION: Check goals. Will need re-cert or d/c      Howie Ill, PT, DPT 04/11/23 12:20 PM

## 2023-04-18 ENCOUNTER — Ambulatory Visit: Payer: Medicare HMO

## 2023-04-18 DIAGNOSIS — R42 Dizziness and giddiness: Secondary | ICD-10-CM | POA: Diagnosis not present

## 2023-04-18 DIAGNOSIS — R2681 Unsteadiness on feet: Secondary | ICD-10-CM

## 2023-04-18 NOTE — Therapy (Signed)
OUTPATIENT PHYSICAL THERAPY VESTIBULAR TREATMENT/DISCHARGE SUMMARY   Patient Name: Scott Spence MRN: [id number] DOB:07-Jun-1951, 72 y.o., male Today's Date: 04/18/2023  PCP: Jerl Mina, MD  REFERRING PROVIDER: Jerl Mina, MD  PHYSICAL THERAPY DISCHARGE SUMMARY  Visits from Start of Care: 6  Current functional level related to goals / functional outcomes: See Clinical Impression Statement   Remaining deficits: None   Education / Equipment: HEP Provided   Patient agrees to discharge. Patient goals were met. Patient is being discharged due to meeting the stated rehab goals.   END OF SESSION:  PT End of Session - 04/18/23 1100     Visit Number 6    Number of Visits 9    Date for PT Re-Evaluation 04/26/23    Authorization Type [employer] Medicare    Progress Note Due on Visit 10    PT Start Time 1100    PT Stop Time 1133    PT Time Calculation (min) 33 min    Equipment Utilized During Treatment Gait belt    Activity Tolerance Patient tolerated treatment well;No increased pain    Behavior During Therapy WFL for tasks assessed/performed             Past Medical History:  Diagnosis Date   BPH with urinary obstruction    Chronic kidney disease    hemorrhagic nephritis as a child   Complication of anesthesia    collapsed lung with local for LUE surgery   GERD (gastroesophageal reflux disease)    Gout    History of kidney stones    Hypertension    Stroke [facility])    Past Surgical History:  Procedure Laterality Date   APPENDECTOMY     FOOT ARTHRODESIS Right 03/29/2022   Procedure: SUBTALAR ARTHRODESIS;  Surgeon: Toni Arthurs, MD;  Location: [facility];  Service: Orthopedics;  Laterality: Right;   HERNIA REPAIR     HOLEP-LASER ENUCLEATION OF THE PROSTATE WITH MORCELLATION N/A 03/09/2022   Procedure: HOLEP-LASER ENUCLEATION OF THE PROSTATE WITH MORCELLATION;  Surgeon: Sondra Come, MD;  Location: [facility];  Service: Urology;  Laterality: N/A;    KIDNEY STONE SURGERY     PROSTATECTOMY     TOTAL HIP ARTHROPLASTY Right 04/01/2018   Procedure: TOTAL HIP ARTHROPLASTY ANTERIOR APPROACH;  Surgeon: Kennedy Bucker, MD;  Location: [facility];  Service: Orthopedics;  Laterality: Right;    ONSET DATE: Referral Date: 02/05/2023  REFERRING DIAG: R42 (ICD-10-CM) - Dizziness and giddiness   THERAPY DIAG:  Dizziness and giddiness  Unsteadiness on feet  Rationale for Evaluation and Treatment: Rehabilitation  PERTINENT HISTORY: CKD, GERD, HTN  PRECAUTIONS: None  SUBJECTIVE: Patient reports got glasses yesterday, with prisms. Reports double vision has resolved since this issue. No other new changes. Patient reports that he completed the exercises with no symptoms and feeling better overall.    PAIN:  Are you having pain? No   OBJECTIVE:  FOTO:   DPS: 61.5  DFS: 62  Dynamic Visual Acuity:   Static: 8   Dynamic: 6 (no diplopia, blurred vision or dizziness)   Motion Sensitivity Quotient  Intensity: 0 = none, 1 = Lightheaded, 2 = Mild, 3 = Moderate, 4 = Severe, 5 = Vomiting  Intensity  1. Sitting to supine 0  2. Supine to L side 0  3. Supine to R side 0  4. Supine to sitting 0  5. L Hallpike-Dix 0  6. Up from L  0  7. R Hallpike-Dix 0  8. Up from R  0  9. Sitting, head  tipped to L knee 0  10. Head up from L  knee 0  11. Sitting, head  tipped to R knee 0  12. Head up from R  knee 0  13. Sitting head turns x5 0  14.Sitting head nods x5 0  15. In stance, 180  turn to L  0  16. In stance, 180  turn to R 0    VESTIBULAR TREATMENT:  Gaze Adaptation:   x1 Viewing Horizontal: Position: Seated, Time: 60 seconds, Reps: 2, and Comment: with prism glasses; no diplopia noted, tolerated well Re-added to HEP and x1 Viewing Vertical:  Position: seated, Time: 60 seconds, Reps: 2, and Comment: no dizziness or diplopia reported.   Reviewed the following HEP and updated:  Oculomotor: Saccades    Holding two targets positioned  side by side 4 inches apart, move eyes quickly from target to target as head stays still. Complete 10 reps each direction. Do not complete diagonal direction at this time. Perform sitting.Repeat 2 times per session. Do 2 sessions per day, 3-4 days/week.       Gaze Stabilization: Sitting    Keeping eyes on target on wall 3-4 feet away, tilt head down 15-30 and move head side to side for 60 seconds. Repeat while moving head up and down for 60 seconds. Do 2 sessions per day, 3-4 days/week.     PATIENT EDUCATION: Education details: Progress toward LTGs; HEP review/update, Planned d/c  Person educated: Patient Education method: Explanation Education comprehension: verbalized understanding   HOME EXERCISE PROGRAM: Corrective Saccades, Saccades, VOR   GOALS: Goals reviewed with patient? Yes   SHORT TERM GOALS: Target date: 03/29/2023   Pt will be independent with initial HEP for improved vestibular input/VOR  Baseline: no HEP established; reports independence with HEP Goal status: MET   2.  Pt will improve DVA to </= 2 line difference to indicate improved VOR Baseline: 4 line difference; 2 Line Difference Goal status: MET       LONG TERM GOALS: Target date: 04/26/2023   Pt will be independent with final HEP for improved vestibular input/VOR  Baseline: no HEP established; reports IND with updated HEP Goal status: MET   2.  Pt will improve DVA to </= 3 line difference to indicate improved VOR Baseline: 4 line difference; 2 Line Difference Goal status: MET   3.  Pt will improve DFS to >/= 57 Baseline: 52; 62% Goal status: MET   4.  Pt will report </= 1/5 for all movements on MSQ to indicate improvement in motion sensitivity and improved activity tolerance.  Baseline: 3/5 for vertical head turns; 0/5 on all components of MSQ Goal status: MET   ASSESSMENT:   CLINICAL IMPRESSION: Today's skilled PT session included assessment of patient's progress toward LTGs. Patient able  to meet all LTG's this date, demonstrating improved dizziness and diplopia. Patient had no dizziness (0/5) on all components of MSQ. Patient also demonstrate 2 line difference on DVA, patient had 4 line difference on evaluation. Patient has made significant progress with therapy and demonstrate readiness to discharge from PT services at this time. Pt agreeable to discharge this date.     OBJECTIVE IMPAIRMENTS: decreased balance and dizziness.    ACTIVITY LIMITATIONS: transfers and locomotion level   PARTICIPATION LIMITATIONS: driving, community activity, and yard work   PERSONAL FACTORS: Age, Time since onset of injury/illness/exacerbation, and 3+ comorbidities: CKD, GERD, HTN, foot arthrodesis  are also affecting patient's functional outcome.    REHAB  POTENTIAL: Good   CLINICAL DECISION MAKING: Stable/uncomplicated   EVALUATION COMPLEXITY: Low     PLAN:   PT FREQUENCY: 1x/week   PT DURATION: 8 weeks   PLANNED INTERVENTIONS: Therapeutic exercises, Therapeutic activity, Neuromuscular re-education, Balance training, Gait training, Patient/Family education, Self Care, Joint mobilization, Stair training, Vestibular training, Canalith repositioning, DME instructions, and Manual therapy   PLAN FOR NEXT SESSION: d/c this date.      Howie Ill, PT, DPT 04/18/23 12:03 PM

## 2023-04-18 NOTE — Patient Instructions (Addendum)
  Oculomotor: Saccades    Holding two targets positioned side by side 4 inches apart, move eyes quickly from target to target as head stays still. Complete 10 reps each direction. Do not complete diagonal direction at this time. Perform sitting.Repeat 2 times per session. Do 2 sessions per day, 3-4 days/week.     Gaze Stabilization: Sitting    Keeping eyes on target on wall 3-4 feet away, tilt head down 15-30 and move head side to side for 60 seconds. Repeat while moving head up and down for 60 seconds. Do 2 sessions per day, 3-4 days/week.

## 2023-05-09 ENCOUNTER — Other Ambulatory Visit: Payer: Self-pay | Admitting: *Deleted

## 2023-05-09 DIAGNOSIS — C61 Malignant neoplasm of prostate: Secondary | ICD-10-CM

## 2023-05-22 ENCOUNTER — Other Ambulatory Visit: Payer: Medicare HMO

## 2023-05-22 DIAGNOSIS — C61 Malignant neoplasm of prostate: Secondary | ICD-10-CM

## 2023-05-23 LAB — PSA TOTAL (REFLEX TO FREE): Prostate Specific Ag, Serum: 0.4 ng/mL (ref 0.0–4.0)

## 2023-05-29 ENCOUNTER — Ambulatory Visit: Payer: Medicare Other | Admitting: Urology

## 2023-05-30 ENCOUNTER — Encounter: Payer: Self-pay | Admitting: Urology

## 2023-05-30 ENCOUNTER — Ambulatory Visit: Payer: Medicare HMO | Admitting: Urology

## 2023-05-30 VITALS — BP 128/84 | HR 88 | Ht 66.0 in | Wt 177.0 lb

## 2023-05-30 DIAGNOSIS — C61 Malignant neoplasm of prostate: Secondary | ICD-10-CM | POA: Diagnosis not present

## 2023-05-30 DIAGNOSIS — Z87438 Personal history of other diseases of male genital organs: Secondary | ICD-10-CM | POA: Diagnosis not present

## 2023-05-30 DIAGNOSIS — N138 Other obstructive and reflux uropathy: Secondary | ICD-10-CM

## 2023-05-30 LAB — BLADDER SCAN AMB NON-IMAGING

## 2023-05-30 NOTE — Progress Notes (Signed)
   05/30/2023 11:05 AM   Juliann Pulse [birth year] [id number]  Reason for visit: Follow up BPH status post HOLEP, gross hematuria, low risk prostate cancer  HPI: 72 year old male with worsening urinary symptoms despite Flomax who opted for HOLEP.  He has a history of an elevated PSA ranging from 5-9, and a negative prostate biopsy in 2018, and 4K score showing only 11% chance of clinically significant prostate cancer.  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  Postop PSA dropped appropriately to 0.4 from 8.6.  We discussed the need to continue to monitor the PSA long-term(active surveillance), and that additional treatments are unlikely to be needed in the future. Notably, he was admitted 6 weeks after surgery with hematuria and clot retention that required CBI.  He passed a voiding trial a few days later, and has been doing well since that time with no further gross hematuria.    He is urinating with a strong stream and is not having any incontinence.  Denies any nocturia.  Overall very happy with urinary symptoms at this time, PVR today normal at 41ml.  PSA remains stable and low at 0.4, can continue yearly monitoring with PCP.  Continue PSA surveillance of very low risk prostate cancer incidentally found at time of follow-up, PCP can check PSA every 1 to 2 years He prefers to follow-up with urology as needed   Sondra Come, MD  [facility] [address] [place], [place] [address] [phone]

## 2023-05-31 ENCOUNTER — Other Ambulatory Visit: Payer: Medicare Other

## 2023-06-05 ENCOUNTER — Ambulatory Visit: Payer: Medicare Other | Admitting: Urology

## 2023-07-31 ENCOUNTER — Ambulatory Visit: Payer: Medicare HMO | Admitting: Podiatry

## 2023-08-01 ENCOUNTER — Ambulatory Visit: Payer: Medicare Other | Admitting: Podiatry

## 2023-08-01 ENCOUNTER — Encounter: Payer: Self-pay | Admitting: Podiatry

## 2023-08-01 DIAGNOSIS — L84 Corns and callosities: Secondary | ICD-10-CM

## 2023-08-01 NOTE — Progress Notes (Signed)
 Subjective:  Patient ID: Scott Spence, male    DOB: [birth year],  MRN: [id number]  Chief Complaint  Patient presents with   Foot Pain    My big toes are hurting.  I have atrophy on my right side below my knee.    73 y.o. male presents with the above complaint.  Patient presents for right first and second digit heloma molle.  Patient states is hurting.  Was to get it evaluated.  He is known to Dr. Verta who did decrease it down.  He has not seen anyone as prior to seeing me denies any other acute complaints.   Review of Systems: Negative except as noted in the HPI. Denies N/V/F/Ch.  Past Medical History:  Diagnosis Date   BPH with urinary obstruction    Chronic kidney disease    hemorrhagic nephritis as a child   Complication of anesthesia    collapsed lung with local for LUE surgery   GERD (gastroesophageal reflux disease)    Gout    History of kidney stones    Hypertension    Stroke [facility])     Current Outpatient Medications:    allopurinol  (ZYLOPRIM ) 300 MG tablet, Take 300 mg by mouth daily. , Disp: , Rfl:    amLODipine  (NORVASC ) 5 MG tablet, Take 1 tablet (5 mg total) by mouth daily., Disp: 90 tablet, Rfl: 3   aspirin  EC 81 MG tablet, Take 1 tablet (81 mg total) by mouth daily., Disp: 30 tablet, Rfl: 12   Cholecalciferol  (VITAMIN D -1000 MAX ST) 25 MCG (1000 UT) tablet, Take 1,000 Units by mouth daily., Disp: , Rfl:    cycloSPORINE  (RESTASIS ) 0.05 % ophthalmic emulsion, INSTILL ONE DROP TO EACH EYE TWICE A DAY, Disp: , Rfl:    fluorometholone (FML) 0.1 % ophthalmic suspension, 1 drop 4 (four) times daily., Disp: , Rfl:    nortriptyline (PAMELOR) 10 MG capsule, TAKE 1 CAPSULE BY MOUTH EVERY DAY IN THE EVENING FOR 1 WEEK THEN TAKE 2 CAPSULES BY MOUTH EVERY DAY IN THE EVENING, Disp: , Rfl:    olmesartan (BENICAR) 40 MG tablet, Take 40 mg by mouth daily., Disp: , Rfl:    omeprazole (PRILOSEC) 20 MG capsule, Take 20 mg by mouth daily. , Disp: , Rfl:    rosuvastatin  (CRESTOR ) 10 MG  tablet, Take 1 tablet (10 mg total) by mouth daily., Disp: 90 tablet, Rfl: 3  Social History   Tobacco Use  Smoking Status Never   Passive exposure: Never  Smokeless Tobacco Never    Allergies  Allergen Reactions   Meperidine Other (See Comments)    GI Upset   Sulfa Antibiotics Hives   Gabapentin Other (See Comments)    Migraine HA   Lisinopril Cough    Other Reaction(s): Unknown   Objective:  There were no vitals filed for this visit. There is no height or weight on file to calculate BMI. Constitutional Well developed. Well nourished.  Vascular Dorsalis pedis pulses palpable bilaterally. Posterior tibial pulses palpable bilaterally. Capillary refill normal to all digits.  No cyanosis or clubbing noted. Pedal hair growth normal.  Neurologic Normal speech. Oriented to person, place, and time. Epicritic sensation to light touch grossly present bilaterally.  Dermatologic Right hallux and second digit heloma molle.  Pain on palpation.  No open wounds or lesion noted.  Orthopedic: Normal joint ROM without pain or crepitus bilaterally. No visible deformities. No bony tenderness.   Radiographs: None Assessment:   1. Heloma molle    Plan:  Patient was  evaluated and treated and all questions answered.  Right hallux heloma molle -All questions and concerns were discussed with the patient in extensive detail given the amount of pain there is any benefit from debridement of the lesion using chisel blade handle the lesion was debrided down to healthy dry tissue.  No complication noted no pinpoint bleeding noted  No follow-ups on file.

## 2023-09-03 ENCOUNTER — Ambulatory Visit: Payer: Medicare Other | Admitting: Vascular Surgery

## 2023-09-03 ENCOUNTER — Ambulatory Visit (HOSPITAL_COMMUNITY)
Admission: RE | Admit: 2023-09-03 | Discharge: 2023-09-03 | Disposition: A | Discharge status: Home / Self Care | Payer: Medicare Other | Source: Ambulatory Visit | Attending: Vascular Surgery | Admitting: Vascular Surgery

## 2023-09-03 ENCOUNTER — Encounter: Payer: Self-pay | Admitting: Vascular Surgery

## 2023-09-03 VITALS — BP 127/86 | HR 72 | Temp 98.0°F | Resp 18 | Ht 66.0 in | Wt 183.4 lb

## 2023-09-03 DIAGNOSIS — I6521 Occlusion and stenosis of right carotid artery: Secondary | ICD-10-CM | POA: Diagnosis present

## 2023-09-03 NOTE — Progress Notes (Signed)
Patient name: Eliu Batch MRN: [id number] DOB: 08-Dec-1950 Sex: male  REASON FOR CONSULT: 6 month follow-up, surveillance carotid artery disease   HPI: Deontay Ladnier is a 73 y.o. male, history of hypertension, BPH, stage III CKD that presents for 6 month follow-up of his carotid artery disease.  Patient previously presented to the ED with acute dizziness and vertigo.  He underwent MRI brain on 08/07/2022 that did not show any acute abnormality.  There was question of a possible brainstem stroke by neurology although this was not shown on the MRI.  CTA neck during the same admission showed a 70% right ICA stenosis with some vertebral artery disease.  He has seen a neurologist at [facility] who did not feel he had a stroke and feels this is benign positional vertigo.     On follow-up today still having intermittent dizziness.  No other stroke or TIA symptoms.  Past Medical History:  Diagnosis Date   BPH with urinary obstruction    Carotid artery occlusion    Chronic kidney disease    hemorrhagic nephritis as a child   Complication of anesthesia    collapsed lung with local for LUE surgery   GERD (gastroesophageal reflux disease)    Gout    History of kidney stones    Hypertension    Stroke [facility])     Past Surgical History:  Procedure Laterality Date   APPENDECTOMY     FOOT ARTHRODESIS Right 03/29/2022   Procedure: SUBTALAR ARTHRODESIS;  Surgeon: Toni Arthurs, MD;  Location: [facility];  Service: Orthopedics;  Laterality: Right;   HERNIA REPAIR     HOLEP-LASER ENUCLEATION OF THE PROSTATE WITH MORCELLATION N/A 03/09/2022   Procedure: HOLEP-LASER ENUCLEATION OF THE PROSTATE WITH MORCELLATION;  Surgeon: Sondra Come, MD;  Location: [facility] ORS;  Service: Urology;  Laterality: N/A;   KIDNEY STONE SURGERY     PROSTATECTOMY     TOTAL HIP ARTHROPLASTY Right 04/01/2018   Procedure: TOTAL HIP ARTHROPLASTY ANTERIOR APPROACH;  Surgeon: Kennedy Bucker, MD;  Location: [facility] ORS;  Service:  Orthopedics;  Laterality: Right;    Family History  Problem Relation Age of Onset   Heart attack Father    Prostate cancer Neg Hx    Bladder Cancer Neg Hx    Kidney cancer Neg Hx     SOCIAL HISTORY: Social History   Socioeconomic History   Marital status: Married    Spouse name: Not on file   Number of children: 2   Years of education: Not on file   Highest education level: Not on file  Occupational History   Occupation: Retired - Transport planner [employer]  Tobacco Use   Smoking status: Never    Passive exposure: Never   Smokeless tobacco: Never  Vaping Use   Vaping status: Never Used  Substance and Sexual Activity   Alcohol use: Yes    Alcohol/week: 14.0 standard drinks of alcohol    Types: 14 Glasses of wine per week    Comment: 3-4 glasses a day   Drug use: No   Sexual activity: Yes    Birth control/protection: None  Other Topics Concern   Not on file  Social History Narrative   Not on file   Social Drivers of Health   Financial Resource Strain: Patient Declined (12/03/2022)   Received from [facility]   Overall Financial Resource Strain (CARDIA)    Difficulty of Paying Living Expenses: Patient declined  Food Insecurity: Patient Declined (12/03/2022)  Received from [facility]   Hunger Vital Sign    Worried About Running Out of Food in the Last Year: Patient declined    Ran Out of Food in the Last Year: Patient declined  Transportation Needs: Patient Declined (12/03/2022)   Received from [facility] - Transportation    In the past 12 months, has lack of transportation kept you from medical appointments or from getting medications?: Patient declined    Lack of Transportation (Non-Medical): Patient declined  Physical Activity: Not on file  Stress: Not on file  Social Connections: Not on file  Intimate Partner Violence: Not At Risk (08/07/2022)   Humiliation, Afraid, Rape, and Kick questionnaire     Fear of Current or Ex-Partner: No    Emotionally Abused: No    Physically Abused: No    Sexually Abused: No    Allergies  Allergen Reactions   Meperidine Other (See Comments)    GI Upset   Sulfa Antibiotics Hives   Gabapentin Other (See Comments)    Migraine HA   Lisinopril Cough    Other Reaction(s): Unknown    Current Outpatient Medications  Medication Sig Dispense Refill   allopurinol (ZYLOPRIM) 300 MG tablet Take 300 mg by mouth daily.      amLODipine (NORVASC) 5 MG tablet Take 1 tablet (5 mg total) by mouth daily. 90 tablet 3   aspirin EC 81 MG tablet Take 1 tablet (81 mg total) by mouth daily. 30 tablet 12   Cholecalciferol (VITAMIN D-1000 MAX ST) 25 MCG (1000 UT) tablet Take 1,000 Units by mouth daily.     cycloSPORINE (RESTASIS) 0.05 % ophthalmic emulsion INSTILL ONE DROP TO EACH EYE TWICE A DAY     olmesartan (BENICAR) 40 MG tablet Take 40 mg by mouth daily.     omeprazole (PRILOSEC) 20 MG capsule Take 20 mg by mouth daily.      rosuvastatin (CRESTOR) 10 MG tablet Take 1 tablet (10 mg total) by mouth daily. 90 tablet 3   fluorometholone (FML) 0.1 % ophthalmic suspension 1 drop 4 (four) times daily. (Patient not taking: Reported on 09/03/2023)     nortriptyline (PAMELOR) 10 MG capsule TAKE 1 CAPSULE BY MOUTH EVERY DAY IN THE EVENING FOR 1 WEEK THEN TAKE 2 CAPSULES BY MOUTH EVERY DAY IN THE EVENING     No current facility-administered medications for this visit.    REVIEW OF SYSTEMS:  X denotes positive finding, denotes negative finding Cardiac  Comments:  Chest pain or chest pressure:    Shortness of breath upon exertion:    Short of breath when lying flat:    Irregular heart rhythm:        Vascular    Pain in calf, thigh, or hip brought on by ambulation:    Pain in feet at night that wakes you up from your sleep:     Blood clot in your veins:    Leg swelling:         Pulmonary    Oxygen at home:    Productive cough:     Wheezing:         Neurologic     Sudden weakness in arms or legs:     Sudden numbness in arms or legs:     Sudden onset of difficulty speaking or slurred speech:    Temporary loss of vision in one eye:     Problems with dizziness:  Gastrointestinal    Blood in stool:     Vomited blood:         Genitourinary    Burning when urinating:     Blood in urine:        Psychiatric    Major depression:         Hematologic    Bleeding problems:    Problems with blood clotting too easily:        Skin    Rashes or ulcers:        Constitutional    Fever or chills:      PHYSICAL EXAM: Vitals:   09/03/23 1417 09/03/23 1419  BP: 126/82 127/86  Pulse: 72   Resp: 18   Temp: 98 F (36.7 C)   TempSrc: Temporal   SpO2: 95%   Weight: 183 lb 6.4 oz (83.2 kg)   Height: 5\' 6"  (1.676 m)     GENERAL: The patient is a well-nourished male, in no acute distress. The vital signs are documented above. CARDIAC: There is a regular rate and rhythm.  VASCULAR:  Bilateral radial pulses palpable PULMONARY: No respiratory distress. ABDOMEN: Soft and non-tender. MUSCULOSKELETAL: There are no major deformities or cyanosis. NEUROLOGIC: No focal weakness or paresthesias are detected.  Cranial nerves II through XII grossly intact. SKIN: There are no ulcers or rashes noted. PSYCHIATRIC: The patient has a normal affect.  DATA:   Carotid duplex today shows 60 to 79% right ICA stenosis and 1 to 39% left ICA stenosis  Carotid duplex 03/05/23 showed 40-59% right and 1-39% left ICA stenosis by velocity criteria   CTA neck 08/07/2022: approximately 70% stenosis of the proximal right ICA.  Severe right and mild left vertebral artery stenosis and the right vert is nondominant.  Assessment/Plan:  73 year old male presents for six month follow-up of his carotid artery disease.  Previously seen in the ED last year in January with dizziness that has subsequently been evaluated by neurology at [facility] felt to be benign positional vertigo.   He had a CT at that time showing approximate 70% right ICA stenosis.  Duplex today shows a 60-79% right ICA stenosis by velocity criteria.  Discussed for asymptomatic carotid disease we recommend surgery when it is over 80% for stroke risk reduction.  I do not think his dizziness is related to his carotid disease.  He has antegrade flow in both vertebral arteries.  I will see him in 6 months with another carotid duplex for close interval surveillance.  No surgical indication at this time.  Aspirin statin for risk reduction.  Cephus Shelling, MD [facility] Office: [phone]

## 2023-09-12 ENCOUNTER — Other Ambulatory Visit: Payer: Self-pay | Admitting: *Deleted

## 2023-09-12 ENCOUNTER — Ambulatory Visit: Payer: Medicare HMO | Admitting: Dermatology

## 2023-09-12 DIAGNOSIS — I6521 Occlusion and stenosis of right carotid artery: Secondary | ICD-10-CM

## 2023-09-16 ENCOUNTER — Ambulatory Visit: Payer: Medicare Other | Admitting: Dermatology

## 2023-09-16 ENCOUNTER — Encounter: Payer: Self-pay | Admitting: Dermatology

## 2023-09-16 DIAGNOSIS — Z1283 Encounter for screening for malignant neoplasm of skin: Secondary | ICD-10-CM

## 2023-09-16 DIAGNOSIS — L814 Other melanin hyperpigmentation: Secondary | ICD-10-CM

## 2023-09-16 DIAGNOSIS — L821 Other seborrheic keratosis: Secondary | ICD-10-CM

## 2023-09-16 DIAGNOSIS — Z7189 Other specified counseling: Secondary | ICD-10-CM

## 2023-09-16 DIAGNOSIS — L719 Rosacea, unspecified: Secondary | ICD-10-CM | POA: Diagnosis not present

## 2023-09-16 DIAGNOSIS — L578 Other skin changes due to chronic exposure to nonionizing radiation: Secondary | ICD-10-CM

## 2023-09-16 DIAGNOSIS — W908XXA Exposure to other nonionizing radiation, initial encounter: Secondary | ICD-10-CM

## 2023-09-16 DIAGNOSIS — D1801 Hemangioma of skin and subcutaneous tissue: Secondary | ICD-10-CM

## 2023-09-16 DIAGNOSIS — L739 Follicular disorder, unspecified: Secondary | ICD-10-CM

## 2023-09-16 DIAGNOSIS — D229 Melanocytic nevi, unspecified: Secondary | ICD-10-CM

## 2023-09-16 NOTE — Progress Notes (Signed)
 New Patient Visit   Subjective  Scott Spence is a 73 y.o. male who presents for the following: redness at cheeks, using metronidazole 0.75% cr twice daily prescribed by PCP for > 3 months. Also using a vitamin C and CeraVe. Patient does not get any bumps with redness. Not using any sunscreen. Patient advises the redness has improved.   Patient here for upper body skin exam and skin cancer screening. No hx skin cancer.   The patient has spots, moles and lesions to be evaluated, some may be new or changing and the patient may have concern these could be cancer.   The following portions of the chart were reviewed this encounter and updated as appropriate: medications, allergies, medical history  Review of Systems:  No other skin or systemic complaints except as noted in HPI or Assessment and Plan.  Objective  Well appearing patient in no apparent distress; mood and affect are within normal limits.    A focused examination was performed of the following areas: All skin from the waist up examined.   Relevant exam findings are noted in the Assessment and Plan.    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 Condition is symptomatic / bothersome to patient. Well controlled  Rosacea is a chronic progressive skin condition usually affecting the face of adults, causing redness and/or acne bumps. It is treatable but not curable. It sometimes affects the eyes (ocular rosacea) as well. It may respond to topical and/or systemic medication and can flare with stress, sun exposure, alcohol, exercise, topical steroids (including hydrocortisone/cortisone 10) and some foods.  Daily application of broad spectrum spf 30+ sunscreen to face is recommended to reduce flares.  Treatment Plan Continue metronidazole 0.75% cr twice daily.   Counseling for BBL / IPL / Laser and Coordination of  Care Discussed the treatment option of Broad Band Light (BBL) /Intense Pulsed Light (IPL)/ Laser for skin discoloration, including brown spots and redness.  Typically we recommend at least 1-3 treatment sessions about 5-8 weeks apart for best results.  Cannot have tanned skin when BBL performed, and regular use of sunscreen/photoprotection is advised after the procedure to help maintain results. The patient's condition may also require "maintenance treatments" in the future.  The fee for BBL / laser treatments is $350 per treatment session for the whole face.  A fee can be quoted for other parts of the body.  Insurance typically does not pay for BBL/laser treatments and therefore the fee is an out-of-pocket cost.   ACTINIC DAMAGE - chronic, secondary to cumulative UV radiation exposure/sun exposure over time - diffuse scaly erythematous macules with underlying dyspigmentation - Recommend daily broad spectrum sunscreen SPF 30+ to sun-exposed areas, reapply every 2 hours as needed.  - Recommend staying in the shade or wearing long sleeves, sun glasses (UVA+UVB protection) and wide brim hats (4-inch brim around the entire circumference of the hat). - Call for new or changing lesions.  LENTIGINES Exam: scattered tan macules Due to sun exposure Treatment Plan: Benign-appearing, observe. Recommend daily broad spectrum sunscreen SPF 30+ to sun-exposed areas, reapply every 2 hours as needed.  Call for any changes  SEBORRHEIC KERATOSIS - Stuck-on, waxy, tan-brown papules and/or plaques  - Benign-appearing - Discussed benign etiology and prognosis. - Observe - Call for any changes  HEMANGIOMA Exam: red papule(s) Discussed benign nature. Recommend observation. Call for changes.  FOLLICULITIS Exam: Perifollicular erythematous  papules and pustules at L axilla  Folliculitis occurs due to inflammation of the superficial hair follicle (pore), resulting in acne-like lesions (pus bumps). It can be  infectious (bacterial, fungal) or noninfectious (shaving, tight clothing, heat/sweat, medications).  Folliculitis can be acute or chronic and recommended treatment depends on the underlying cause of folliculitis.  Treatment Plan: Clean with chlorhexidine (hibiclens) wash daily in shower. Do not get chlorhexidine (hibiclens) wash in the eyes or ears.  Patient defers oral doxycycline at this time.  ROSACEA   LENTIGINES   ACTINIC ELASTOSIS   MULTIPLE BENIGN NEVI   CHERRY ANGIOMA   SEBORRHEIC KERATOSES   FOLLICULITIS    Return in about 1 year (around 09/15/2024) for with Dr. Katrinka Blazing, Rosacea, UBSE.  Anise Salvo, RMA, am acting as scribe for Elie Goody, MD .   Documentation: I have reviewed the above documentation for accuracy and completeness, and I agree with the above.  Elie Goody, MD

## 2023-09-16 NOTE — Patient Instructions (Addendum)
 Do not get chlorhexidine (hibiclens) wash in the eyes or ears.   Due to recent changes in healthcare laws, you may see results of your pathology and/or laboratory studies on MyChart before the doctors have had a chance to review them. We understand that in some cases there may be results that are confusing or concerning to you. Please understand that not all results are received at the same time and often the doctors may need to interpret multiple results in order to provide you with the best plan of care or course of treatment. Therefore, we ask that you please give [place] 2 business days to thoroughly review all your results before contacting the office for clarification. Should we see a critical lab result, you will be contacted sooner.   If You Need Anything After Your Visit  If you have any questions or concerns for your doctor, please call our main line at [phone] and press option 4 to reach your doctor's medical assistant. If no one answers, please leave a voicemail as directed and we will return your call as soon as possible. Messages left after 4 pm will be answered the following business day.   You may also send [place] a message via MyChart. We typically respond to MyChart messages within 1-2 business days.  For prescription refills, please ask your pharmacy to contact our office. Our fax number is [phone].  If you have an urgent issue when the clinic is closed that cannot wait until the next business day, you can page your doctor at the number below.    Please note that while we do our best to be available for urgent issues outside of office hours, we are not available 24/7.   If you have an urgent issue and are unable to reach [place], you may choose to seek medical care at your doctor's office, retail clinic, urgent care center, or emergency room.  If you have a medical emergency, please immediately call 911 or go to the emergency department.  Pager Numbers  - Dr. Gwen Pounds:  [phone]  - Dr. Roseanne Reno: [phone]  - Dr. Katrinka Blazing: [phone]   In the event of inclement weather, please call our main line at [phone] for an update on the status of any delays or closures.  Dermatology Medication Tips: Please keep the boxes that topical medications come in in order to help keep track of the instructions about where and how to use these. Pharmacies typically print the medication instructions only on the boxes and not directly on the medication tubes.   If your medication is too expensive, please contact our office at [phone] option 4 or send [place] a message through MyChart.   We are unable to tell what your co-pay for medications will be in advance as this is different depending on your insurance coverage. However, we may be able to find a substitute medication at lower cost or fill out paperwork to get insurance to cover a needed medication.   If a prior authorization is required to get your medication covered by your insurance company, please allow [place] 1-2 business days to complete this process.  Drug prices often vary depending on where the prescription is filled and some pharmacies may offer cheaper prices.  The website www.goodrx.com contains coupons for medications through different pharmacies. The prices here do not account for what the cost may be with help from insurance (it may be cheaper with your insurance), but the website can give you the price if you did not use  any insurance.  - You can print the associated coupon and take it with your prescription to the pharmacy.  - You may also stop by our office during regular business hours and pick up a GoodRx coupon card.  - If you need your prescription sent electronically to a different pharmacy, notify our office through [facility] or by phone at [phone] option 4.     Si Usted Necesita Algo Despus de Su Visita  Tambin puede enviarnos un mensaje a travs de Clinical cytogeneticist. Por lo general  respondemos a los mensajes de MyChart en el transcurso de 1 a 2 das hbiles.  Para renovar recetas, por favor pida a su farmacia que se ponga en contacto con nuestra oficina. Annie Sable de fax es [place] [phone].  Si tiene un asunto urgente cuando la clnica est cerrada y que no puede esperar hasta el siguiente da hbil, puede llamar/localizar a su doctor(a) al nmero que aparece a continuacin.   Por favor, tenga en cuenta que aunque hacemos todo lo posible para estar disponibles para asuntos urgentes fuera del horario de [place], no estamos disponibles las 24 horas del da, los 7 [address]  [address] de la [place].   Si tiene un problema urgente y no puede comunicarse con nosotros, puede optar por buscar atencin mdica  en el consultorio de su doctor(a), en una clnica privada, en un centro de atencin urgente o en una sala de emergencias.  Si tiene Engineer, drilling, por favor llame inmediatamente al 911 o vaya a la sala de emergencias.  Nmeros de bper  - Dr. Gwen Pounds: [phone]  - Dra. Roseanne Reno: [phone]  - Dr. Katrinka Blazing: [phone]   En caso de inclemencias del tiempo, por favor llame a Lacy Duverney principal al [phone] para una actualizacin sobre el [place] de cualquier retraso o cierre.  Consejos para la medicacin en dermatologa: Por favor, guarde las cajas en las que vienen los medicamentos de uso tpico para ayudarle a seguir las instrucciones sobre dnde y cmo usarlos. Las farmacias generalmente imprimen las instrucciones del medicamento slo en las cajas y no directamente en los tubos del [place].   Si su medicamento es muy caro, por favor, pngase en contacto con Rolm Gala llamando al [phone] y presione la opcin 4 o envenos un mensaje a travs de Clinical cytogeneticist.   No podemos decirle cul ser su copago por los medicamentos por adelantado ya que esto es diferente dependiendo de la cobertura de su seguro. Sin embargo, es posible que podamos encontrar un  medicamento sustituto a Audiological scientist un formulario para que el seguro cubra el medicamento que se considera necesario.   Si se requiere una autorizacin previa para que su compaa de seguros [place] su medicamento, por favor permtanos de 1 a 2 das hbiles para completar [address]  Los precios de los medicamentos varan con frecuencia dependiendo del Environmental consultant de dnde se surte la receta y alguna farmacias pueden ofrecer precios ms baratos.  El sitio web www.goodrx.com tiene cupones para medicamentos de Health and safety inspector. Los precios aqu no tienen en cuenta lo que podra costar con la ayuda del seguro (puede ser ms barato con su seguro), pero el sitio web puede darle el precio si no utiliz Tourist information centre manager.  - Puede imprimir el cupn correspondiente y llevarlo con su receta a la farmacia.  - Tambin puede pasar por nuestra oficina durante el horario de atencin regular y Education officer, museum una tarjeta de cupones de GoodRx.  - Si necesita que su receta se enve electrnicamente  a [place] [employer], informe a nuestra oficina a travs de MyChart de [place] o por telfono llamando al [phone] y presione la opcin 4.

## 2024-03-10 ENCOUNTER — Ambulatory Visit (HOSPITAL_COMMUNITY)
Admission: RE | Admit: 2024-03-10 | Discharge: 2024-03-10 | Disposition: A | Discharge status: Home / Self Care | Payer: Medicare Other | Source: Ambulatory Visit | Attending: Vascular Surgery | Admitting: Vascular Surgery

## 2024-03-10 ENCOUNTER — Ambulatory Visit: Payer: Medicare Other | Admitting: Vascular Surgery

## 2024-03-10 ENCOUNTER — Encounter: Payer: Self-pay | Admitting: Vascular Surgery

## 2024-03-10 VITALS — BP 146/84 | HR 62 | Temp 98.1°F | Resp 18 | Ht 66.0 in | Wt 172.6 lb

## 2024-03-10 DIAGNOSIS — I6521 Occlusion and stenosis of right carotid artery: Secondary | ICD-10-CM

## 2024-03-10 NOTE — Progress Notes (Signed)
 Patient name: Scott Spence MRN: [id number] DOB: April 13, 1951 Sex: male  REASON FOR CONSULT: 6 month follow-up, surveillance carotid artery disease   HPI: Scott Spence is a 73 y.o. male, history of hypertension, BPH, stage III CKD that presents for 6 month follow-up of his carotid artery disease.  Patient previously presented to the ED with acute dizziness and vertigo.  He underwent MRI brain on 08/07/2022 that did not show any acute abnormality.  There was question of a possible brainstem stroke by neurology although this was not shown on the MRI.  CTA neck during the same admission showed a 70% right ICA stenosis with some vertebral artery disease.  He has seen a neurologist at [facility] who did not feel he had a stroke and feels this is benign positional vertigo.     On follow-up today states he gets dizzy when he turns his head to the right.  No recent signs of TIA or stroke otherwise.  Past Medical History:  Diagnosis Date   BPH with urinary obstruction    Carotid artery occlusion    Chronic kidney disease    hemorrhagic nephritis as a child   Complication of anesthesia    collapsed lung with local for LUE surgery   GERD (gastroesophageal reflux disease)    Gout    History of kidney stones    Hypertension    Stroke [facility])     Past Surgical History:  Procedure Laterality Date   APPENDECTOMY     FOOT ARTHRODESIS Right 03/29/2022   Procedure: SUBTALAR ARTHRODESIS;  Surgeon: Kit Rush, MD;  Location: [facility];  Service: Orthopedics;  Laterality: Right;   HERNIA REPAIR     HOLEP-LASER ENUCLEATION OF THE PROSTATE WITH MORCELLATION N/A 03/09/2022   Procedure: HOLEP-LASER ENUCLEATION OF THE PROSTATE WITH MORCELLATION;  Surgeon: Francisca Redell BROCKS, MD;  Location: [facility];  Service: Urology;  Laterality: N/A;   KIDNEY STONE SURGERY     PROSTATECTOMY     TOTAL HIP ARTHROPLASTY Right 04/01/2018   Procedure: TOTAL HIP ARTHROPLASTY ANTERIOR APPROACH;  Surgeon: Kathlynn Sharper,  MD;  Location: [facility];  Service: Orthopedics;  Laterality: Right;    Family History  Problem Relation Age of Onset   Heart attack Father    Prostate cancer Neg Hx    Bladder Cancer Neg Hx    Kidney cancer Neg Hx     SOCIAL HISTORY: Social History   Socioeconomic History   Marital status: Married    Spouse name: Not on file   Number of children: 2   Years of education: Not on file   Highest education level: Not on file  Occupational History   Occupation: Retired - Transport planner [employer]  Tobacco Use   Smoking status: Never    Passive exposure: Never   Smokeless tobacco: Never  Vaping Use   Vaping status: Never Used  Substance and Sexual Activity   Alcohol use: Yes    Alcohol/week: 14.0 standard drinks of alcohol    Types: 14 Glasses of wine per week    Comment: 3-4 glasses a day   Drug use: No   Sexual activity: Yes    Birth control/protection: None  Other Topics Concern   Not on file  Social History Narrative   Not on file   Social Drivers of Health   Financial Resource Strain: Low Risk  (02/04/2024)   Received from [facility]   Overall Financial Resource Strain (CARDIA)    Difficulty of Paying  Living Expenses: Not hard at all  Food Insecurity: No Food Insecurity (02/04/2024)   Received from [facility]   Hunger Vital Sign    Within the past 12 months, you worried that your food would run out before you got the money to buy more.: Never true    Within the past 12 months, the food you bought just didn't last and you didn't have money to get more.: Never true  Transportation Needs: No Transportation Needs (02/04/2024)   Received from [facility] - Transportation    In the past 12 months, has lack of transportation kept you from medical appointments or from getting medications?: No    Lack of Transportation (Non-Medical): No  Physical Activity: Not on file  Stress: Not on file  Social  Connections: Not on file  Intimate Partner Violence: Not At Risk (08/07/2022)   Humiliation, Afraid, Rape, and Kick questionnaire    Fear of Current or Ex-Partner: No    Emotionally Abused: No    Physically Abused: No    Sexually Abused: No    Allergies  Allergen Reactions   Meperidine Other (See Comments)    GI Upset   Sulfa Antibiotics Hives   Gabapentin Other (See Comments)    Migraine HA   Lisinopril Cough    Other Reaction(s): Unknown    Current Outpatient Medications  Medication Sig Dispense Refill   allopurinol  (ZYLOPRIM ) 300 MG tablet Take 300 mg by mouth daily.      amLODipine  (NORVASC ) 5 MG tablet Take 1 tablet (5 mg total) by mouth daily. 90 tablet 3   aspirin  EC 81 MG tablet Take 1 tablet (81 mg total) by mouth daily. 30 tablet 12   Cholecalciferol  (VITAMIN D -1000 MAX ST) 25 MCG (1000 UT) tablet Take 1,000 Units by mouth daily.     cycloSPORINE  (RESTASIS ) 0.05 % ophthalmic emulsion INSTILL ONE DROP TO EACH EYE TWICE A DAY     olmesartan (BENICAR) 40 MG tablet Take 40 mg by mouth daily.     omeprazole (PRILOSEC) 20 MG capsule Take 20 mg by mouth daily.      rosuvastatin  (CRESTOR ) 10 MG tablet Take 1 tablet (10 mg total) by mouth daily. 90 tablet 3   No current facility-administered medications for this visit.    REVIEW OF SYSTEMS:  X denotes positive finding, denotes negative finding Cardiac  Comments:  Chest pain or chest pressure:    Shortness of breath upon exertion:    Short of breath when lying flat:    Irregular heart rhythm:        Vascular    Pain in calf, thigh, or hip brought on by ambulation:    Pain in feet at night that wakes you up from your sleep:     Blood clot in your veins:    Leg swelling:         Pulmonary    Oxygen at home:    Productive cough:     Wheezing:         Neurologic    Sudden weakness in arms or legs:     Sudden numbness in arms or legs:     Sudden onset of difficulty speaking or slurred speech:    Temporary loss of  vision in one eye:     Problems with dizziness:         Gastrointestinal    Blood in stool:     Vomited blood:  Genitourinary    Burning when urinating:     Blood in urine:        Psychiatric    Major depression:         Hematologic    Bleeding problems:    Problems with blood clotting too easily:        Skin    Rashes or ulcers:        Constitutional    Fever or chills:      PHYSICAL EXAM: Vitals:   03/10/24 1128 03/10/24 1130  BP: 138/88 (!) 146/84  Pulse: 62   Resp: 18   Temp: 98.1 F (36.7 C)   TempSrc: Temporal   SpO2: 98%   Weight: 172 lb 9.6 oz (78.3 kg)   Height: 5' 6 (1.676 m)     GENERAL: The patient is a well-nourished male, in no acute distress. The vital signs are documented above. CARDIAC: There is a regular rate and rhythm.  PULMONARY: No respiratory distress. ABDOMEN: Soft and non-tender. MUSCULOSKELETAL: There are no major deformities or cyanosis. NEUROLOGIC: No focal weakness or paresthesias are detected.  Cranial nerves II through XII grossly intact. SKIN: There are no ulcers or rashes noted. PSYCHIATRIC: The patient has a normal affect.  DATA:   Carotid duplex today again shows 60 to 79% right ICA stenosis and 1 to 39% left ICA stenosis  CTA neck 08/07/2022: approximately 70% stenosis of the proximal right ICA.  Severe right and mild left vertebral artery stenosis and the right vert is nondominant.  Assessment/Plan:  73 year old male presents for six month follow-up of his carotid artery disease.  Previously seen in the ED last year in January 2024 with dizziness that has subsequently been evaluated by neurology at [facility] felt to be benign positional vertigo.  He had a CT at that time showing approximate 70% right ICA stenosis.  Duplex today again shows a 60-79% right ICA stenosis by velocity criteria.  Discussed no progression to high-grade stenosis.  Discussed for asymptomatic carotid disease we recommend surgery when it is over 80%  for stroke risk reduction.  I do not think his dizziness is related to his carotid disease.  He has antegrade flow in both vertebral arteries and minimal left carotid disease.  No signs of global hypoperfusion.  I will see him in 6 months with another carotid duplex for close interval surveillance.  No surgical indication at this time.  Aspirin  statin for risk reduction.  Discussed the main reason we intervene on carotid disease for stroke risk reduction.  Lonni DOROTHA Gaskins, MD [facility] Office: [phone]

## 2024-03-11 ENCOUNTER — Other Ambulatory Visit: Payer: Self-pay

## 2024-03-11 DIAGNOSIS — I6521 Occlusion and stenosis of right carotid artery: Secondary | ICD-10-CM

## 2024-07-31 ENCOUNTER — Other Ambulatory Visit: Payer: Self-pay | Admitting: Cardiovascular Disease

## 2024-07-31 MED ORDER — AMLODIPINE BESYLATE 5 MG PO TABS: 5.0000 mg | ORAL_TABLET | Freq: Every day | ORAL | 0 refills | Status: AC

## 2024-08-03 ENCOUNTER — Ambulatory Visit (INDEPENDENT_AMBULATORY_CARE_PROVIDER_SITE_OTHER)

## 2024-08-03 ENCOUNTER — Ambulatory Visit: Admitting: Podiatry

## 2024-08-03 DIAGNOSIS — R269 Unspecified abnormalities of gait and mobility: Secondary | ICD-10-CM | POA: Diagnosis not present

## 2024-08-03 DIAGNOSIS — M19071 Primary osteoarthritis, right ankle and foot: Secondary | ICD-10-CM | POA: Diagnosis not present

## 2024-08-03 DIAGNOSIS — M205X1 Other deformities of toe(s) (acquired), right foot: Secondary | ICD-10-CM | POA: Diagnosis not present

## 2024-08-03 NOTE — Progress Notes (Signed)
 Scott Spence presents today for chief complaint of pain to the hallux of his right foot.  States that the bottom of the tire really hurts when he steps down on it.  He states that has been like this for quite some time now and has been getting worse.  He is also concerned about falls as he feels that his balance and gait are getting worse.  Objective: Vital signs are stable alert oriented x 3 pulses are palpable.  He has muscle atrophy of the right lower extremity resulting in weakness has had multiple surgeries on his right foot.  His extensors in his right foot are very weak.  Flexors are weak as well.  He has pain on palpation of the medial and plantar aspect of the hallux interphalangeal joint.  There is no skin breakdown and there is no reactive hyperkeratoses.  4 view radiographs taken today demonstrate osseously mature individual with with 2 large screws for subtalar joint fusion.  He does have osteoarthritic changes with some cystic changes at the level of the interphalangeal joint of the hallux.  I see no signs of infection.  No fractures.  The area does demonstrate sclerosis subchondral eburnation.  Assessment: Gait and balance issues.  Right leg atrophy.  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  Plan: Discussed etiology pathology conservative versus surgical therapies at this point we are going to get him into gait and balance training class at physical therapy [facility] [address] in [place].  We are so going to get him a carbon graphite insole to help prevent the bending of his shoes.  He understands this is amenable to it and states that he is already feeling improvement when he leaves our office today.

## 2024-08-10 ENCOUNTER — Telehealth: Payer: Self-pay | Admitting: Podiatry

## 2024-08-10 NOTE — Telephone Encounter (Signed)
 Patient called stating Dr Verta sent him somewhere off [facility] for his balance. Patient is stating its been a week and he has not heard anything from them.

## 2024-08-20 ENCOUNTER — Ambulatory Visit: Attending: Podiatry

## 2024-08-20 DIAGNOSIS — R262 Difficulty in walking, not elsewhere classified: Secondary | ICD-10-CM | POA: Insufficient documentation

## 2024-08-20 DIAGNOSIS — R2681 Unsteadiness on feet: Secondary | ICD-10-CM | POA: Diagnosis present

## 2024-08-20 DIAGNOSIS — M205X1 Other deformities of toe(s) (acquired), right foot: Secondary | ICD-10-CM | POA: Diagnosis present

## 2024-08-20 DIAGNOSIS — M6281 Muscle weakness (generalized): Secondary | ICD-10-CM | POA: Insufficient documentation

## 2024-08-20 DIAGNOSIS — R2689 Other abnormalities of gait and mobility: Secondary | ICD-10-CM | POA: Diagnosis present

## 2024-08-20 DIAGNOSIS — R269 Unspecified abnormalities of gait and mobility: Secondary | ICD-10-CM | POA: Diagnosis present

## 2024-08-20 DIAGNOSIS — R42 Dizziness and giddiness: Secondary | ICD-10-CM | POA: Diagnosis present

## 2024-08-20 NOTE — Therapy (Signed)
 " OUTPATIENT PHYSICAL THERAPY NEURO EVALUATION   Patient Name: Scott Spence MRN: [id number] DOB:1951-06-20, 74 y.o., male Today's Date: 08/20/2024   PCP: Valora Lynwood FALCON, MD   REFERRING PROVIDER: Verta Royden DASEN, [place]   END OF SESSION:  PT End of Session - 08/20/24 1034     Visit Number 1    Number of Visits 17    Date for Recertification  10/15/24    PT Start Time 1030    PT Stop Time 1115    PT Time Calculation (min) 45 min    Activity Tolerance Patient tolerated treatment well;No increased pain    Behavior During Therapy WFL for tasks assessed/performed          Past Medical History:  Diagnosis Date   BPH with urinary obstruction    Carotid artery occlusion    Chronic kidney disease    hemorrhagic nephritis as a child   Complication of anesthesia    collapsed lung with local for LUE surgery   GERD (gastroesophageal reflux disease)    Gout    History of kidney stones    Hypertension    Stroke [facility])    Past Surgical History:  Procedure Laterality Date   APPENDECTOMY     FOOT ARTHRODESIS Right 03/29/2022   Procedure: SUBTALAR ARTHRODESIS;  Surgeon: Kit Rush, MD;  Location: [facility];  Service: Orthopedics;  Laterality: Right;   HERNIA REPAIR     HOLEP-LASER ENUCLEATION OF THE PROSTATE WITH MORCELLATION N/A 03/09/2022   Procedure: HOLEP-LASER ENUCLEATION OF THE PROSTATE WITH MORCELLATION;  Surgeon: Francisca Redell BROCKS, MD;  Location: [facility];  Service: Urology;  Laterality: N/A;   KIDNEY STONE SURGERY     PROSTATECTOMY     TOTAL HIP ARTHROPLASTY Right 04/01/2018   Procedure: TOTAL HIP ARTHROPLASTY ANTERIOR APPROACH;  Surgeon: Kathlynn Sharper, MD;  Location: [facility];  Service: Orthopedics;  Laterality: Right;   Patient Active Problem List   Diagnosis Date Noted   Carotid artery stenosis 08/28/2022   Vertigo, peripheral 08/07/2022   Hematuria 05/07/2022   Gross hematuria 05/06/2022   Acute urinary retention 05/06/2022   BPH S/P HoLEP prostatectomy  02/2022 05/06/2022   Status post total hip replacement, right 04/01/2018   Primary osteoarthritis of right hip 01/15/2018   Serrated adenoma of colon 12/30/2014   Anemia, iron deficiency 07/06/2013   Benign prostatic hyperplasia 07/06/2013   Bilateral sensorineural hearing loss 06/29/2010   Tinnitus 06/20/2010   Gouty arthropathy 05/25/2010   Esophageal reflux 12/27/2001   Essential hypertension 12/27/2001    ONSET DATE: 07/23/2022  REFERRING DIAG:  M20.5X1 (ICD-10-CM) - Mallet toe of right foot  R26.9 (ICD-10-CM) - Abnormality of gait    THERAPY DIAG:  Mallet toe of right foot  Abnormality of gait  Dizziness and giddiness  Unsteadiness on feet  Difficulty in walking, not elsewhere classified  Abnormality of gait and mobility  Other abnormalities of gait and mobility  Muscle weakness (generalized)  Rationale for Evaluation and Treatment: Rehabilitation  SUBJECTIVE:  SUBJECTIVE STATEMENT:  Pt reports that balance has been worse lately.  Pt notes that bright lights and sunlight makes his balance worse.  Pt notes that he was seeing Pt for double vision and then went to neurosurgery, in which he was given exercises for vestibular complications.  Pt notes that he was given exercises in which he would be seated and tilt the body/lateral lean to each side in order to reset the crystals.  Pt is seeking therapy to stop the dizziness, and to improve the sensation in the R foot/toes.  Pt notes he used to walk 3 miles a day, but has since cut back to a mile every other day due to the pain he was experiencing in the R great toe.    Pt accompanied by: self  PERTINENT HISTORY:   From MD: Scott Spence presents today for chief complaint of pain to the hallux of his right foot.  States that the bottom of  the tire really hurts when he steps down on it.  He states that has been like this for quite some time now and has been getting worse.  He is also concerned about falls as he feels that his balance and gait are getting worse.  PAIN:  Are you having pain? Yes: NPRS scale: 1-2/10 at rest; 9-10/10 when walking Pain location: Great toe of R foot Pain description: dull ache in sitting,  Aggravating factors: walking Relieving factors: rest  PRECAUTIONS: None  RED FLAGS: None   WEIGHT BEARING RESTRICTIONS: No  FALLS: Has patient fallen in last 6 months? Yes. Number of falls 4  LIVING ENVIRONMENT: Lives with: lives with their spouse Lives in: House/apartment Stairs: Yes: Internal: 15 steps; on right going up, on left going up, and R going up the first 8 steps, L going up the second 8 steps and External: 3 steps; on left going up Has following equipment at home: Single point cane, Walker - 2 wheeled, and Grab bars  PLOF: Independent  PATIENT GOALS: Improve balance   OBJECTIVE:  Note: Objective measures were completed at Evaluation unless otherwise noted.  DIAGNOSTIC FINDINGS:   4 view radiographs taken today demonstrate osseously mature individual with with 2 large screws for subtalar joint fusion.  He does have osteoarthritic changes with some cystic changes at the level of the interphalangeal joint of the hallux.  I see no signs of infection.  No fractures.  The area does demonstrate sclerosis subchondral eburnation.   COGNITION: Overall cognitive status: Within functional limits for tasks assessed   SENSATION: Pt with dermatomal testing that is [facility] for everything but the L5 region  COORDINATION: Pt with good coordination.  POSTURE: forward head  LOWER EXTREMITY ROM:     Active  Right Eval Left Eval  Hip flexion    Hip extension    Hip abduction    Hip adduction    Hip internal rotation    Hip external rotation    Knee flexion    Knee extension    Ankle  dorsiflexion    Ankle plantarflexion    Ankle inversion    Ankle eversion     (Blank rows = not tested)  LOWER EXTREMITY MMT:    MMT Right Eval Left Eval  Hip flexion 4 4+  Hip extension    Hip abduction 4- 4-  Hip adduction 4- 4-  Hip internal rotation    Hip external rotation    Knee flexion 4- 4  Knee extension 4- 4  Ankle dorsiflexion 4+ 4+  Ankle plantarflexion    Ankle inversion    Ankle eversion    (Blank rows = not tested)  STAIRS: Not tested GAIT: Findings: Distance walked: 25 and Comments: Pt with antalgic gait pattern with staggering to both sides at times during the assessment.  FUNCTIONAL TESTS:  5 times sit to stand: 10.68 sec Timed up and go (TUG): 13.12 sec 6 minute walk test: TBD 10 meter walk test: 0.82 m/s Berg Balance Scale:  Item Test date: 08/20/24 Date:  Date:   Sitting to standing 4. able to stand without using hands and stabilize independently Insert SmartPhrase OPRCBERGREEVAL Insert SmartPhrase OPRCBERGREEVAL  2. Standing unsupported 4. able to stand safely for 2 minutes    3. Sitting with back unsupported, feet supported 4. able to sit safely and securely for 2 minutes    4. Standing to sitting 4. sits safely with minimal use of hands    5. Pivot transfer  4. able to transfer safely with minor use of hands    6. Standing unsupported with eyes closed 4. able to stand 10 seconds safely    7. Standing unsupported with feet together 2. able to place feet together independently but unable to hold for 30 seconds    8. Reaching forward with outstretched arms while standing 4. can reach forward confidently 25 cm (10 inches)    9. Pick up object from the floor from standing 4. able to pick up slipper safely and easily    10. Turning to look behind over left and right shoulders while standing 4. looks behind from both sides and weight shifts well    11. Turn 360 degrees 2. able to turn 360 degrees safely but slowly    12. Place alternate foot on step  or stool while standing unsupported 3. able to stand independently and complete 8 steps in > 20 seconds     13. Standing unsupported one foot in front 1. needs help to step but can hold 15 seconds    14. Standing on one leg 2. able to lift leg independently and hold >= 3 seconds      Total Score 46/56 Total Score:    Total Score:      PATIENT SURVEYS:  ABC scale: The Activities-Specific Balance Confidence (ABC) Scale 0% 10 20 30  40 50 60 70 80 90 100% No confidence<->completely confident  How confident are you that you will not lose your balance or become unsteady when you . . .   Date tested 08/20/2024  Walk around the house 80%  2. Walk up or down stairs 60%  3. Bend over and pick up a slipper from in front of a closet floor 30%  4. Reach for a small can off a shelf at eye level 80%  5. Stand on tip toes and reach for something above your head 50%  6. Stand on a chair and reach for something 10%  7. Sweep the floor 10%  8. Walk outside the house to a car parked in the driveway 80%  9. Get into or out of a car 70%  10. Walk across a parking lot to the mall 50%  11. Walk up or down a ramp 50%  12. Walk in a crowded mall where people rapidly walk past you 70%  13. Are bumped into by people as you walk through the mall 60%  14. Step onto or off of an escalator while you are holding onto the railing 30%  15. Step onto or  off an escalator while holding onto parcels such that you cannot hold onto the railing 20%  16. Walk outside on icy sidewalks 20%  Total: 16/16 48.13%                                                                                                                                 TREATMENT DATE: 08/20/2024  Evaluation  Self-Care/Home Management:  Pt given education on current POC, the findings of the evaluation, and the ways in which skilled therapy can address the current deficits in balance and musculature strength.  Pt instructed on how this can improve their  overall function and maintain/improve their overall quality of life.       PATIENT EDUCATION: Education details: Pt educated on role of PT and services provided during current POC, along with prognosis and information about the clinic.   Person educated: Patient Education method: Explanation Education comprehension: verbalized understanding  HOME EXERCISE PROGRAM: TBD  GOALS: Goals reviewed with patient? Yes  SHORT TERM GOALS: Target date: 09/17/2024  Pt will be independent with HEP in order to demonstrate increased ability to perform tasks related to occupation/hobbies. Baseline: Goal status: INITIAL   LONG TERM GOALS: Target date: 10/15/2024  1.  Patient (> 5 years old) will complete five times sit to stand test in < 15 seconds indicating an increased LE strength and improved balance. Baseline: 10.68 sec Goal status: INITIAL  2.  Patient will increase Berg Balance score by > 6 points to demonstrate decreased fall risk during functional activities. Baseline: 46/56 Goal status: INITIAL   3.  Patient will reduce timed up and go to <11 seconds to reduce fall risk and demonstrate improved transfer/gait ability. Baseline: 13.12 sec Goal status: INITIAL  4.  Patient will increase 10 meter walk test to >1.66m/s as to improve gait speed for better community ambulation and to reduce fall risk. Baseline: 12.23 sec; 0.82 m/s Goal status: INITIAL  5.  Patient will increase six minute walk test distance to >1000 for progression to community ambulator and improve gait ability Baseline: TBD Goal status: INITIAL   ASSESSMENT:  CLINICAL IMPRESSION:  Patient is a 74 y.o. male who was seen today for physical therapy evaluation and treatment for balance and frequent falls.  Pt currently demonstrates complicating factors such as poor eyesight with bright lights, along with pain in the R great toe causing imbalance.  Pt also has numbness in the R and L forefoot/lesser toes causing some  imbalance as well due to not being able to feel the ground as well.  Pt also is experiencing dizziness as well and would benefit from potential vestibular assessment.  Pt ultimately demonstrates the ability to improve overall balance and stability with skilled physical therapy.    OBJECTIVE IMPAIRMENTS: Abnormal gait, decreased activity tolerance, decreased balance, decreased endurance, decreased mobility, difficulty walking, decreased strength, hypomobility, and pain.   ACTIVITY LIMITATIONS: standing, squatting, stairs, and locomotion level  PARTICIPATION  LIMITATIONS: meal prep, cleaning, laundry, and yard work  PERSONAL FACTORS: Age, Fitness, Past/current experiences, Time since onset of injury/illness/exacerbation, and 3+ comorbidities: CKD, Gout, HTN, prior Stroke are also affecting patient's functional outcome.   REHAB POTENTIAL: Good  CLINICAL DECISION MAKING: Evolving/moderate complexity  EVALUATION COMPLEXITY: Moderate  PLAN:  PT FREQUENCY: 2x/week  PT DURATION: 8 weeks  PLANNED INTERVENTIONS: 97750- Physical Performance Testing, 97110-Therapeutic exercises, 97530- Therapeutic activity, [id number]- Neuromuscular re-education, 97535- Self Care, [id number]- Manual therapy, [id number]- Gait training, [phone] (1-2 muscles), 20561 (3+ muscles)- Dry Needling, Patient/Family education, Balance training, Stair training, Vestibular training, Visual/preceptual remediation/compensation, Cryotherapy, and Moist heat  PLAN FOR NEXT SESSION:   Perform 6 minute walk test, assess greater toe ROM and response to mobility, generate HEP and give to pt to work on, possibly assess vestibular symptoms?    Fonda Simpers, PT, DPT Physical Therapist - [facility]  08/20/24, 12:57 PM   "

## 2024-08-27 ENCOUNTER — Ambulatory Visit

## 2024-08-27 DIAGNOSIS — M6281 Muscle weakness (generalized): Secondary | ICD-10-CM

## 2024-08-27 DIAGNOSIS — R2689 Other abnormalities of gait and mobility: Secondary | ICD-10-CM

## 2024-08-27 DIAGNOSIS — M205X1 Other deformities of toe(s) (acquired), right foot: Secondary | ICD-10-CM

## 2024-08-27 DIAGNOSIS — R42 Dizziness and giddiness: Secondary | ICD-10-CM

## 2024-08-27 DIAGNOSIS — R278 Other lack of coordination: Secondary | ICD-10-CM

## 2024-08-27 DIAGNOSIS — R2681 Unsteadiness on feet: Secondary | ICD-10-CM

## 2024-08-27 DIAGNOSIS — R262 Difficulty in walking, not elsewhere classified: Secondary | ICD-10-CM

## 2024-08-27 DIAGNOSIS — R269 Unspecified abnormalities of gait and mobility: Secondary | ICD-10-CM

## 2024-08-27 NOTE — Therapy (Signed)
 " OUTPATIENT PHYSICAL THERAPY NEURO EVALUATION   Patient Name: Scott Spence MRN: [id number] DOB:[birth year], 74 y.o., male Today's Date: 08/27/2024   PCP: Valora Lynwood FALCON, MD   REFERRING PROVIDER: Verta Royden DASEN, [place]   END OF SESSION:  PT End of Session - 08/27/24 1558     Visit Number 2    Number of Visits 17    Date for Recertification  10/15/24    PT Start Time 1600    PT Stop Time 1642    PT Time Calculation (min) 42 min           Past Medical History:  Diagnosis Date   BPH with urinary obstruction    Carotid artery occlusion    Chronic kidney disease    hemorrhagic nephritis as a child   Complication of anesthesia    collapsed lung with local for LUE surgery   GERD (gastroesophageal reflux disease)    Gout    History of kidney stones    Hypertension    Stroke [facility])    Past Surgical History:  Procedure Laterality Date   APPENDECTOMY     FOOT ARTHRODESIS Right 03/29/2022   Procedure: SUBTALAR ARTHRODESIS;  Surgeon: Kit Rush, MD;  Location: [facility];  Service: Orthopedics;  Laterality: Right;   HERNIA REPAIR     HOLEP-LASER ENUCLEATION OF THE PROSTATE WITH MORCELLATION N/A 03/09/2022   Procedure: HOLEP-LASER ENUCLEATION OF THE PROSTATE WITH MORCELLATION;  Surgeon: Francisca Redell BROCKS, MD;  Location: [facility];  Service: Urology;  Laterality: N/A;   KIDNEY STONE SURGERY     PROSTATECTOMY     TOTAL HIP ARTHROPLASTY Right 04/01/2018   Procedure: TOTAL HIP ARTHROPLASTY ANTERIOR APPROACH;  Surgeon: Kathlynn Sharper, MD;  Location: [facility];  Service: Orthopedics;  Laterality: Right;   Patient Active Problem List   Diagnosis Date Noted   Carotid artery stenosis 08/28/2022   Vertigo, peripheral 08/07/2022   Hematuria 05/07/2022   Gross hematuria 05/06/2022   Acute urinary retention 05/06/2022   BPH S/P HoLEP prostatectomy 02/2022 05/06/2022   Status post total hip replacement, right 04/01/2018   Primary osteoarthritis of right hip 01/15/2018    Serrated adenoma of colon 12/30/2014   Anemia, iron deficiency 07/06/2013   Benign prostatic hyperplasia 07/06/2013   Bilateral sensorineural hearing loss 06/29/2010   Tinnitus 06/20/2010   Gouty arthropathy 05/25/2010   Esophageal reflux 12/27/2001   Essential hypertension 12/27/2001    ONSET DATE: 07/23/2022  REFERRING DIAG:  M20.5X1 (ICD-10-CM) - Mallet toe of right foot  R26.9 (ICD-10-CM) - Abnormality of gait    THERAPY DIAG:  Mallet toe of right foot  Difficulty in walking, not elsewhere classified  Other lack of coordination  Abnormality of gait  Abnormality of gait and mobility  Dizziness and giddiness  Other abnormalities of gait and mobility  Unsteadiness on feet  Muscle weakness (generalized)  Rationale for Evaluation and Treatment: Rehabilitation  SUBJECTIVE:  SUBJECTIVE STATEMENT:  Pt reports 8-9/10 in heel of R foot and notes it might be due to weather. Pt  reports dizziness today and poor balance over past couple of days.  Pt accompanied by: self  PERTINENT HISTORY:   From MD: Ademide presents today for chief complaint of pain to the hallux of his right foot.  States that the bottom of the tire really hurts when he steps down on it.  He states that has been like this for quite some time now and has been getting worse.  He is also concerned about falls as he feels that his balance and gait are getting worse.  PAIN:  Are you having pain? Yes: NPRS scale: 1-2/10 at rest; 9-10/10 when walking Pain location: Great toe of R foot Pain description: dull ache in sitting,  Aggravating factors: walking Relieving factors: rest  PRECAUTIONS: None  RED FLAGS: None   WEIGHT BEARING RESTRICTIONS: No  FALLS: Has patient fallen in last 6 months? Yes. Number of falls  4  LIVING ENVIRONMENT: Lives with: lives with their spouse Lives in: House/apartment Stairs: Yes: Internal: 15 steps; on right going up, on left going up, and R going up the first 8 steps, L going up the second 8 steps and External: 3 steps; on left going up Has following equipment at home: Single point cane, Walker - 2 wheeled, and Grab bars  PLOF: Independent  PATIENT GOALS: Improve balance   OBJECTIVE:  Note: Objective measures were completed at Evaluation unless otherwise noted.  DIAGNOSTIC FINDINGS:   4 view radiographs taken today demonstrate osseously mature individual with with 2 large screws for subtalar joint fusion.  He does have osteoarthritic changes with some cystic changes at the level of the interphalangeal joint of the hallux.  I see no signs of infection.  No fractures.  The area does demonstrate sclerosis subchondral eburnation.   COGNITION: Overall cognitive status: Within functional limits for tasks assessed   SENSATION: Pt with dermatomal testing that is [facility] for everything but the L5 region  COORDINATION: Pt with good coordination.  POSTURE: forward head  LOWER EXTREMITY ROM:     Active  Right Eval Left Eval  Hip flexion    Hip extension    Hip abduction    Hip adduction    Hip internal rotation    Hip external rotation    Knee flexion    Knee extension    Ankle dorsiflexion    Ankle plantarflexion    Ankle inversion    Ankle eversion     (Blank rows = not tested)  LOWER EXTREMITY MMT:    MMT Right Eval Left Eval  Hip flexion 4 4+  Hip extension    Hip abduction 4- 4-  Hip adduction 4- 4-  Hip internal rotation    Hip external rotation    Knee flexion 4- 4  Knee extension 4- 4  Ankle dorsiflexion 4+ 4+  Ankle plantarflexion    Ankle inversion    Ankle eversion    (Blank rows = not tested)  STAIRS: Not tested GAIT: Findings: Distance walked: 25 and Comments: Pt with antalgic gait pattern with staggering to both sides at times  during the assessment.  FUNCTIONAL TESTS:  5 times sit to stand: 10.68 sec Timed up and go (TUG): 13.12 sec 6 minute walk test: TBD 10 meter walk test: 0.82 m/s Berg Balance Scale:  Item Test date: 08/20/24 Date:  Date:   Sitting to standing 4. able to stand without using  hands and stabilize independently Insert SmartPhrase OPRCBERGREEVAL Insert SmartPhrase OPRCBERGREEVAL  2. Standing unsupported 4. able to stand safely for 2 minutes    3. Sitting with back unsupported, feet supported 4. able to sit safely and securely for 2 minutes    4. Standing to sitting 4. sits safely with minimal use of hands    5. Pivot transfer  4. able to transfer safely with minor use of hands    6. Standing unsupported with eyes closed 4. able to stand 10 seconds safely    7. Standing unsupported with feet together 2. able to place feet together independently but unable to hold for 30 seconds    8. Reaching forward with outstretched arms while standing 4. can reach forward confidently 25 cm (10 inches)    9. Pick up object from the floor from standing 4. able to pick up slipper safely and easily    10. Turning to look behind over left and right shoulders while standing 4. looks behind from both sides and weight shifts well    11. Turn 360 degrees 2. able to turn 360 degrees safely but slowly    12. Place alternate foot on step or stool while standing unsupported 3. able to stand independently and complete 8 steps in > 20 seconds     13. Standing unsupported one foot in front 1. needs help to step but can hold 15 seconds    14. Standing on one leg 2. able to lift leg independently and hold >= 3 seconds      Total Score 46/56 Total Score:    Total Score:      PATIENT SURVEYS:  ABC scale: The Activities-Specific Balance Confidence (ABC) Scale 0% 10 20 30  40 50 60 70 80 90 100% No confidence<->completely confident  How confident are you that you will not lose your balance or become unsteady when you . . .    Date tested 08/20/2024  Walk around the house 80%  2. Walk up or down stairs 60%  3. Bend over and pick up a slipper from in front of a closet floor 30%  4. Reach for a small can off a shelf at eye level 80%  5. Stand on tip toes and reach for something above your head 50%  6. Stand on a chair and reach for something 10%  7. Sweep the floor 10%  8. Walk outside the house to a car parked in the driveway 80%  9. Get into or out of a car 70%  10. Walk across a parking lot to the mall 50%  11. Walk up or down a ramp 50%  12. Walk in a crowded mall where people rapidly walk past you 70%  13. Are bumped into by people as you walk through the mall 60%  14. Step onto or off of an escalator while you are holding onto the railing 30%  15. Step onto or off an escalator while holding onto parcels such that you cannot hold onto the railing 20%  16. Walk outside on icy sidewalks 20%  Total: 16/16 48.13%  TREATMENT DATE: 08/27/2024  Neuro: Epley maneuver on both sides to help alleviate dizziness  Manual STM to R foot with great toe mobilizations to decrease pain  TherEx Seated arch lifts 2 x 10 Seated toe raises 2 x 10 Seated towel scrunches x 5 Seated great toe lifts 2 x 10  Matrix bike for 6 minutes at level 2   PATIENT EDUCATION: Education details: Pt educated on role of PT and services provided during current POC, along with prognosis and information about the clinic.   Person educated: Patient Education method: Explanation Education comprehension: verbalized understanding  HOME EXERCISE PROGRAM: Access Code: [email or web address] URL: [email or web address] Date: 08/27/2024 Prepared by: Laymon Perfect  Exercises - Seated Arch Lifts  - 1 x daily - 7 x weekly - 3 sets - 10 reps - Towel Scrunches  - 1 x daily - 7 x weekly - 3 sets - 10 reps -  Seated Toe Raise  - 1 x daily - 7 x weekly - 3 sets - 10 reps - Seated Great Toe Extension  - 1 x daily - 7 x weekly - 3 sets - 10 reps  GOALS: Goals reviewed with patient? Yes  SHORT TERM GOALS: Target date: 09/17/2024  Pt will be independent with HEP in order to demonstrate increased ability to perform tasks related to occupation/hobbies. Baseline: Goal status: INITIAL   LONG TERM GOALS: Target date: 10/15/2024  1.  Patient (> 33 years old) will complete five times sit to stand test in < 15 seconds indicating an increased LE strength and improved balance. Baseline: 10.68 sec Goal status: INITIAL  2.  Patient will increase Berg Balance score by > 6 points to demonstrate decreased fall risk during functional activities. Baseline: 46/56 Goal status: INITIAL   3.  Patient will reduce timed up and go to <11 seconds to reduce fall risk and demonstrate improved transfer/gait ability. Baseline: 13.12 sec Goal status: INITIAL  4.  Patient will increase 10 meter walk test to >1.31m/s as to improve gait speed for better community ambulation and to reduce fall risk. Baseline: 12.23 sec; 0.82 m/s Goal status: INITIAL  5.  Patient will increase six minute walk test distance to >1000 for progression to community ambulator and improve gait ability Baseline: TBD Goal status: INITIAL   ASSESSMENT:  CLINICAL IMPRESSION:  Pt responded well to Epley maneuver on right side vs left side. Pt reported lights were bothersome so session completed with no lights. Patient responded well to the manual therapy approach.  Patient noted a reduction in overall symptom response and felt that the pain was diminished and instead started to feel more like soreness rather than pain.  Patient did note some increased pain with STM in heel region.  Will continue to monitor this symptomatic response at future sessions. Pt will continue to benefit from skilled therapy to address remaining deficits in order to improve  overall QoL and return to PLOF.    Eval: Patient is a 74 y.o. male who was seen today for physical therapy evaluation and treatment for balance and frequent falls.  Pt currently demonstrates complicating factors such as poor eyesight with bright lights, along with pain in the R great toe causing imbalance.  Pt also has numbness in the R and L forefoot/lesser toes causing some imbalance as well due to not being able to feel the ground as well.  Pt also is experiencing dizziness as well and would benefit from potential vestibular assessment.  Pt ultimately demonstrates the ability to improve overall  balance and stability with skilled physical therapy.    OBJECTIVE IMPAIRMENTS: Abnormal gait, decreased activity tolerance, decreased balance, decreased endurance, decreased mobility, difficulty walking, decreased strength, hypomobility, and pain.   ACTIVITY LIMITATIONS: standing, squatting, stairs, and locomotion level  PARTICIPATION LIMITATIONS: meal prep, cleaning, laundry, and yard work  PERSONAL FACTORS: Age, Fitness, Past/current experiences, Time since onset of injury/illness/exacerbation, and 3+ comorbidities: CKD, Gout, HTN, prior Stroke are also affecting patient's functional outcome.   REHAB POTENTIAL: Good  CLINICAL DECISION MAKING: Evolving/moderate complexity  EVALUATION COMPLEXITY: Moderate  PLAN:  PT FREQUENCY: 2x/week  PT DURATION: 8 weeks  PLANNED INTERVENTIONS: 97750- Physical Performance Testing, 97110-Therapeutic exercises, 97530- Therapeutic activity, 97112- Neuromuscular re-education, 97535- Self Care, [id number]- Manual therapy, [phone]- Gait training, [phone] (1-2 muscles), [phone] (3+ muscles)- Dry Needling, Patient/Family education, Balance training, Stair training, Vestibular training, Visual/preceptual remediation/compensation, Cryotherapy, and Moist heat  PLAN FOR NEXT SESSION:   Perform 6 minute walk test, assess greater toe ROM and response to mobility   Laymon GORMAN Perfect, PT, DT Physical Therapist - [facility]   08/27/24, 3:59 PM   "

## 2024-09-02 ENCOUNTER — Ambulatory Visit

## 2024-09-08 ENCOUNTER — Ambulatory Visit

## 2024-09-15 ENCOUNTER — Ambulatory Visit: Payer: Medicare Other | Admitting: Dermatology

## 2024-09-15 ENCOUNTER — Encounter (HOSPITAL_COMMUNITY)

## 2024-09-15 ENCOUNTER — Ambulatory Visit: Admitting: Vascular Surgery

## 2024-09-15 ENCOUNTER — Ambulatory Visit

## 2024-09-23 ENCOUNTER — Ambulatory Visit

## 2024-09-29 ENCOUNTER — Ambulatory Visit

## 2024-10-06 ENCOUNTER — Ambulatory Visit

## 2024-10-08 ENCOUNTER — Ambulatory Visit

## 2024-10-12 ENCOUNTER — Ambulatory Visit

## 2024-10-14 ENCOUNTER — Ambulatory Visit

## 2024-10-20 ENCOUNTER — Ambulatory Visit

## 2024-10-22 ENCOUNTER — Ambulatory Visit

## 2024-10-29 ENCOUNTER — Ambulatory Visit

## 2024-11-02 ENCOUNTER — Ambulatory Visit

## 2024-11-04 ENCOUNTER — Ambulatory Visit
